# Patient Record
Sex: Female | Born: 1965 | Race: White | Hispanic: No | Smoking: Current every day smoker
Health system: Southern US, Community
[De-identification: ages and names within clinical notes are randomized; demographics above are authoritative.]

## PROBLEM LIST (undated history)

## (undated) DIAGNOSIS — Z87442 Personal history of urinary calculi: Secondary | ICD-10-CM

## (undated) DIAGNOSIS — M26609 Unspecified temporomandibular joint disorder, unspecified side: Secondary | ICD-10-CM

## (undated) DIAGNOSIS — I739 Peripheral vascular disease, unspecified: Secondary | ICD-10-CM

## (undated) DIAGNOSIS — F419 Anxiety disorder, unspecified: Secondary | ICD-10-CM

## (undated) DIAGNOSIS — I10 Essential (primary) hypertension: Secondary | ICD-10-CM

## (undated) DIAGNOSIS — J45909 Unspecified asthma, uncomplicated: Secondary | ICD-10-CM

## (undated) DIAGNOSIS — R519 Headache, unspecified: Secondary | ICD-10-CM

## (undated) DIAGNOSIS — R51 Headache: Secondary | ICD-10-CM

## (undated) HISTORY — DX: Anxiety disorder, unspecified: F41.9

## (undated) HISTORY — DX: Unspecified temporomandibular joint disorder, unspecified side: M26.609

## (undated) HISTORY — PX: BIOPSY THYROID: PRO38

## (undated) HISTORY — PX: MENISCUS REPAIR: SHX5179

---
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
## 2005-02-06 ENCOUNTER — Emergency Department (HOSPITAL_COMMUNITY): Admission: EM | Admit: 2005-02-06 | Discharge: 2005-02-06 | Payer: Self-pay | Admitting: Emergency Medicine

## 2005-03-16 ENCOUNTER — Emergency Department (HOSPITAL_COMMUNITY): Admission: EM | Admit: 2005-03-16 | Discharge: 2005-03-16 | Payer: Self-pay | Admitting: Emergency Medicine

## 2009-05-19 ENCOUNTER — Emergency Department (HOSPITAL_COMMUNITY): Admission: EM | Admit: 2009-05-19 | Discharge: 2009-05-19 | Payer: Self-pay | Admitting: Emergency Medicine

## 2009-08-22 ENCOUNTER — Emergency Department (HOSPITAL_COMMUNITY): Admission: EM | Admit: 2009-08-22 | Discharge: 2009-08-22 | Payer: Self-pay | Admitting: Emergency Medicine

## 2010-10-09 ENCOUNTER — Emergency Department (HOSPITAL_COMMUNITY)
Admission: EM | Admit: 2010-10-09 | Discharge: 2010-10-10 | Payer: Self-pay | Source: Home / Self Care | Admitting: Emergency Medicine

## 2011-06-03 ENCOUNTER — Emergency Department (HOSPITAL_COMMUNITY)
Admission: EM | Admit: 2011-06-03 | Discharge: 2011-06-04 | Disposition: A | Discharge status: Home / Self Care | Payer: Medicaid Other | Attending: Emergency Medicine | Admitting: Emergency Medicine

## 2011-06-03 ENCOUNTER — Emergency Department (HOSPITAL_COMMUNITY): Payer: Medicaid Other

## 2011-06-03 DIAGNOSIS — M25519 Pain in unspecified shoulder: Secondary | ICD-10-CM | POA: Insufficient documentation

## 2011-06-03 DIAGNOSIS — R0789 Other chest pain: Secondary | ICD-10-CM | POA: Insufficient documentation

## 2011-06-03 LAB — COMPREHENSIVE METABOLIC PANEL
Albumin: 3.5 g/dL (ref 3.5–5.2)
Alkaline Phosphatase: 82 U/L (ref 39–117)
BUN: 10 mg/dL (ref 6–23)
Creatinine, Ser: 0.82 mg/dL (ref 0.50–1.10)
GFR calc Af Amer: >60 mL/min (ref >60)
Glucose, Bld: 100 mg/dL — ABNORMAL HIGH (ref 70–99)
Potassium: 4.3 mEq/L (ref 3.5–5.1)
Total Bilirubin: 0.1 mg/dL — ABNORMAL LOW (ref 0.3–1.2)
Total Protein: 6.7 g/dL (ref 6.0–8.3)

## 2011-06-03 LAB — DIFFERENTIAL
Basophils Relative: 0 % (ref 0–1)
Eosinophils Absolute: 0.3 10*3/uL (ref 0.0–0.7)
Eosinophils Relative: 2 % (ref 0–5)
Lymphs Abs: 5.1 10*3/uL — ABNORMAL HIGH (ref 0.7–4.0)
Monocytes Absolute: 1.2 10*3/uL — ABNORMAL HIGH (ref 0.1–1.0)
Monocytes Relative: 8 % (ref 3–12)

## 2011-06-03 LAB — POCT I-STAT TROPONIN I: Troponin i, poc: 0 ng/mL (ref 0.00–0.08)

## 2011-06-03 LAB — CBC
MCH: 31.8 pg (ref 26.0–34.0)
MCHC: 33.9 g/dL (ref 30.0–36.0)
MCV: 93.6 fL (ref 78.0–100.0)
Platelets: 360 10*3/uL (ref 150–400)
RBC: 4.25 MIL/uL (ref 3.87–5.11)

## 2011-06-03 LAB — CK TOTAL AND CKMB (NOT AT ARMC)
CK, MB: 2.1 ng/mL (ref 0.3–4.0)
Total CK: 35 U/L (ref 7–177)

## 2011-06-04 LAB — POCT I-STAT TROPONIN I: Troponin i, poc: 0 ng/mL (ref 0.00–0.08)

## 2011-06-24 ENCOUNTER — Encounter: Payer: Self-pay | Admitting: Physician Assistant

## 2011-06-25 ENCOUNTER — Encounter: Payer: Self-pay | Admitting: Physician Assistant

## 2012-06-13 ENCOUNTER — Encounter (HOSPITAL_COMMUNITY): Payer: Self-pay | Admitting: Emergency Medicine

## 2012-06-13 ENCOUNTER — Emergency Department (HOSPITAL_COMMUNITY)
Admission: EM | Admit: 2012-06-13 | Discharge: 2012-06-13 | Disposition: A | Discharge status: Home / Self Care | Payer: Self-pay | Attending: Emergency Medicine | Admitting: Emergency Medicine

## 2012-06-13 DIAGNOSIS — T394X5A Adverse effect of antirheumatics, not elsewhere classified, initial encounter: Secondary | ICD-10-CM | POA: Insufficient documentation

## 2012-06-13 DIAGNOSIS — T7840XA Allergy, unspecified, initial encounter: Secondary | ICD-10-CM | POA: Insufficient documentation

## 2012-06-13 DIAGNOSIS — F988 Other specified behavioral and emotional disorders with onset usually occurring in childhood and adolescence: Secondary | ICD-10-CM | POA: Insufficient documentation

## 2012-06-13 DIAGNOSIS — R51 Headache: Secondary | ICD-10-CM | POA: Insufficient documentation

## 2012-06-13 MED ORDER — ONDANSETRON HCL 4 MG/2ML IJ SOLN
4.0000 mg | Freq: Once | INTRAMUSCULAR | Status: AC
  Administered 2012-06-13: 4 mg via INTRAVENOUS
  Filled 2012-06-13: qty 2

## 2012-06-13 MED ORDER — METHYLPREDNISOLONE SODIUM SUCC 125 MG IJ SOLR
125.0000 mg | Freq: Once | INTRAMUSCULAR | Status: AC
  Administered 2012-06-13: 125 mg via INTRAVENOUS
  Filled 2012-06-13: qty 2

## 2012-06-13 MED ORDER — DIPHENHYDRAMINE HCL 25 MG PO CAPS: 25.0000 mg | ORAL_CAPSULE | Freq: Four times a day (QID) | ORAL | Status: DC | PRN

## 2012-06-13 MED ORDER — PREDNISONE 10 MG PO TABS: 40.0000 mg | ORAL_TABLET | Freq: Every day | ORAL | Status: DC

## 2012-06-13 MED ORDER — SODIUM CHLORIDE 0.9 % IV BOLUS (SEPSIS)
1000.0000 mL | Freq: Once | INTRAVENOUS | Status: AC
  Administered 2012-06-13: 1000 mL via INTRAVENOUS

## 2012-06-13 MED ORDER — TRAMADOL HCL 50 MG PO TABS
50.0000 mg | ORAL_TABLET | Freq: Once | ORAL | Status: AC
  Administered 2012-06-13: 50 mg via ORAL
  Filled 2012-06-13: qty 1

## 2012-06-13 MED ORDER — DIPHENHYDRAMINE HCL 50 MG/ML IJ SOLN
12.5000 mg | Freq: Once | INTRAMUSCULAR | Status: AC
  Administered 2012-06-13: 12.5 mg via INTRAVENOUS
  Filled 2012-06-13: qty 1

## 2012-06-13 MED ORDER — TRAMADOL HCL 50 MG PO TABS: 50.0000 mg | ORAL_TABLET | Freq: Four times a day (QID) | ORAL | Status: DC | PRN

## 2012-06-13 NOTE — ED Notes (Cosign Needed)
8:00 PM Pt seen and examined by me in CDU. Pt with rash, subjective swelling and itching of hands, and horse voice, onset shortly after taking a new prescription of celebrex. Pt with possible allergic reaction, here for monitoring. SOlumedrol, benadryl, given IV. Pt feeling better. Will watch another hour, d/c if continues to improve.   Exam: Pt in NAD. AAOx3. PERRLA. Neck is supple. No swelling of lips, tongue, uvula. Regular HR and rhythm. Lungs are clear to auscultation bilaterally.Neurovascularly intact. No ras noted. No swelling of the hands noted.  9:42 PM Pt with no new symptoms. Tried ultram for her hip pain instead of celebrex. No reaction after hour of monitoring. Pt will be dc home Filed Vitals:   06/13/12 1812  BP: 135/84  Pulse: 98  Temp:   Resp: [address] A Roland Lipke, PA 06/13/12 2142

## 2012-06-13 NOTE — ED Notes (Signed)
Placed on all monitors upon arrival to CDU also cold drink giver per request.

## 2012-06-13 NOTE — ED Notes (Signed)
Report to Trish Fountain RN

## 2012-06-13 NOTE — ED Provider Notes (Signed)
History  This chart was scribed for Frances Numbers, MD by Shari Heritage. The patient was seen in room TR07C/TR07C. Patient's care was started at 1735.     CSN: [id number]  Arrival date & time 06/13/12  1712   First MD Initiated Contact with Patient 06/13/12 1735      Chief Complaint  Patient presents with  . Allergic Reaction  . Rash     The history is provided by the patient. No language interpreter was used.    Frances Graham is a 46 y.o. female who presents to the Emergency Department complaining of a severely itchy, diffuse body rash; nausea; and moderate to severe, constant HA possibly resulting from allergic reaction to medication onset 2 hours ago. Patient states that she took Celebrex at 1:30pm and then started to notice a rash at 3:30pm. Patient states that the rash worsened over time. Patient is also complaining of voice hoarseness. Patient denies abdominal pain. She says that she took 10 mg Zyrtec and took a shower with no relief. Patient has had an anaphylactic reaction to penicillin before. Patient is a current every day smoker.   Past Medical History  Diagnosis Date  . Chest pain   . Attention deficit disorder   . Temporomandibular joint disorder   . Anxiety     History reviewed. No pertinent past surgical history.  History reviewed. No pertinent family history.  History  Substance Use Topics  . Smoking status: Current Every Day Smoker  . Smokeless tobacco: Not on file  . Alcohol Use: No    OB History    Grav Para Term Preterm Abortions TAB SAB Ect Mult Living                  Review of Systems  Constitutional: Negative.   HENT: Positive for voice change.   Eyes: Negative.   Respiratory: Negative.   Cardiovascular: Negative.   Gastrointestinal: Positive for nausea.  Genitourinary: Negative.   Musculoskeletal: Negative.   Skin: Positive for rash.  Neurological: Positive for headaches.    Allergies  Penicillins  Home Medications   Current  Outpatient Rx  Name Route Sig Dispense Refill  . CLONAZEPAM 0.5 MG PO TABS Oral Take 0.5 mg by mouth 2 (two) times daily as needed.      Marland Kitchen LISDEXAMFETAMINE DIMESYLATE 30 MG PO CAPS Oral Take 30 mg by mouth every morning.        BP 140/96  Pulse 121  Temp 98.2 F (36.8 C) (Oral)  Resp 20  SpO2 96%  Physical Exam  Nursing note and vitals reviewed. GEN: Well-developed, well-nourished female in no acute distress HEENT: Atraumatic, normocephalic. Oropharynx clear without erythema. No posterior oropharyngeal swelling. EYES: PERRLA BL, no scleral icterus. NECK: Trachea midline, no meningismus CV: tachycardic with regular rhythm. No murmurs, rubs, or gallops PULM: No respiratory distress.  No crackles, wheezes, or rales. GI: soft, non-tender. No guarding, rebound, or tenderness. + bowel sounds  GU: deferred Neuro: cranial nerves grossly 2-12 intact, no abnormalities of strength or sensation, A and O x 3 MSK: Patient moves all 4 extremities symmetrically, no deformity, edema, or injury noted Skin:Diffuse mildly, erythematous rash that is blanching. Psych: no abnormality of mood  ED Course  Procedures (including critical care time) DIAGNOSTIC STUDIES: Oxygen Saturation is 96% on room air, adequate by my interpretation.    COORDINATION OF CARE: 5:35pm- Patient informed of current plan for treatment and evaluation and agrees with plan at this time.     Labs Reviewed -  No data to display No results found.   1. Allergic reaction       MDM  Patient evaluated by myself.  Based on findings I did not feel patient was having anaphylaxis but her symptoms were consistent with an allergic reaction.  She was treated with benadryl, solumedrol, zofran for nausea, and IVF.  Patient had improvement in vital signs and no signs that made me feel this was anaphylaxis.  Patient transferred to CDU for monitoring for up to 6 hours to make sure there was not a biphasic reaction at 6 hours following  initial exposure.  If patient remains stable she will be able to be discharged home.      I personally performed the services described in this documentation, which was scribed in my presence. The recorded information has been reviewed and considered.      Frances Numbers, MD 06/13/12 2015

## 2012-06-13 NOTE — ED Notes (Addendum)
Cold drink given,Warm blankets applied

## 2012-06-13 NOTE — ED Notes (Addendum)
Pt here with generalized body rash and itching after taking celebrex today; pt sts took 10mg  zyrtec; no SOB noted

## 2012-06-13 NOTE — ED Notes (Signed)
IV infusing in right hand without problems.

## 2013-08-08 ENCOUNTER — Emergency Department (HOSPITAL_COMMUNITY): Payer: Medicaid Other

## 2013-08-08 ENCOUNTER — Emergency Department (HOSPITAL_COMMUNITY)
Admission: EM | Admit: 2013-08-08 | Discharge: 2013-08-08 | Disposition: A | Discharge status: Home / Self Care | Payer: Medicaid Other | Attending: Emergency Medicine | Admitting: Emergency Medicine

## 2013-08-08 ENCOUNTER — Encounter (HOSPITAL_COMMUNITY): Payer: Self-pay | Admitting: Emergency Medicine

## 2013-08-08 DIAGNOSIS — F17200 Nicotine dependence, unspecified, uncomplicated: Secondary | ICD-10-CM | POA: Insufficient documentation

## 2013-08-08 DIAGNOSIS — Z880 Allergy status to penicillin: Secondary | ICD-10-CM | POA: Insufficient documentation

## 2013-08-08 DIAGNOSIS — R1032 Left lower quadrant pain: Secondary | ICD-10-CM | POA: Insufficient documentation

## 2013-08-08 DIAGNOSIS — R110 Nausea: Secondary | ICD-10-CM | POA: Insufficient documentation

## 2013-08-08 DIAGNOSIS — R109 Unspecified abdominal pain: Secondary | ICD-10-CM

## 2013-08-08 DIAGNOSIS — Z3202 Encounter for pregnancy test, result negative: Secondary | ICD-10-CM | POA: Insufficient documentation

## 2013-08-08 DIAGNOSIS — Z8659 Personal history of other mental and behavioral disorders: Secondary | ICD-10-CM | POA: Insufficient documentation

## 2013-08-08 DIAGNOSIS — N898 Other specified noninflammatory disorders of vagina: Secondary | ICD-10-CM | POA: Insufficient documentation

## 2013-08-08 DIAGNOSIS — Z8719 Personal history of other diseases of the digestive system: Secondary | ICD-10-CM | POA: Insufficient documentation

## 2013-08-08 LAB — CBC WITH DIFFERENTIAL/PLATELET
Basophils Absolute: 0 10*3/uL (ref 0.0–0.1)
Basophils Relative: 0 % (ref 0–1)
Eosinophils Absolute: 0.1 10*3/uL (ref 0.0–0.7)
HCT: 43.5 % (ref 36.0–46.0)
Hemoglobin: 15.3 g/dL — ABNORMAL HIGH (ref 12.0–15.0)
MCH: 32.8 pg (ref 26.0–34.0)
MCHC: 35.2 g/dL (ref 30.0–36.0)
Monocytes Absolute: 1.4 10*3/uL — ABNORMAL HIGH (ref 0.1–1.0)
Monocytes Relative: 9 % (ref 3–12)
RDW: 13.4 % (ref 11.5–15.5)

## 2013-08-08 LAB — WET PREP, GENITAL
Trich, Wet Prep: NONE SEEN
WBC, Wet Prep HPF POC: NONE SEEN
Yeast Wet Prep HPF POC: NONE SEEN

## 2013-08-08 LAB — URINALYSIS, ROUTINE W REFLEX MICROSCOPIC
Ketones, ur: NEGATIVE mg/dL
Leukocytes, UA: NEGATIVE
Nitrite: NEGATIVE
Specific Gravity, Urine: 1.029 (ref 1.005–1.030)
pH: 5.5 (ref 5.0–8.0)

## 2013-08-08 LAB — LIPASE, BLOOD: Lipase: 15 U/L (ref 11–59)

## 2013-08-08 LAB — COMPREHENSIVE METABOLIC PANEL
Albumin: 3.9 g/dL (ref 3.5–5.2)
BUN: 8 mg/dL (ref 6–23)
Calcium: 9.7 mg/dL (ref 8.4–10.5)
Creatinine, Ser: 0.64 mg/dL (ref 0.50–1.10)
Total Protein: 7.6 g/dL (ref 6.0–8.3)

## 2013-08-08 MED ORDER — DEXTROSE 50 % IV SOLN: 1.0000 | Freq: Once | INTRAVENOUS | Status: DC

## 2013-08-08 MED ORDER — ONDANSETRON 4 MG PO TBDP
8.0000 mg | ORAL_TABLET | Freq: Once | ORAL | Status: AC
  Administered 2013-08-08: 8 mg via ORAL
  Filled 2013-08-08: qty 2

## 2013-08-08 MED ORDER — PROMETHAZINE HCL 25 MG PO TABS: 25.0000 mg | ORAL_TABLET | Freq: Four times a day (QID) | ORAL | Status: DC | PRN

## 2013-08-08 MED ORDER — HYDROMORPHONE HCL PF 1 MG/ML IJ SOLN
1.0000 mg | Freq: Once | INTRAMUSCULAR | Status: AC
  Administered 2013-08-08: 1 mg via INTRAVENOUS
  Filled 2013-08-08: qty 1

## 2013-08-08 MED ORDER — HYDROMORPHONE HCL PF 2 MG/ML IJ SOLN
2.0000 mg | Freq: Once | INTRAMUSCULAR | Status: AC
  Administered 2013-08-08: 2 mg via INTRAMUSCULAR
  Filled 2013-08-08: qty 1

## 2013-08-08 MED ORDER — OXYCODONE-ACETAMINOPHEN 5-325 MG PO TABS: 1.0000 | ORAL_TABLET | Freq: Four times a day (QID) | ORAL | Status: DC | PRN

## 2013-08-08 NOTE — ED Notes (Signed)
Pt reports that she developed left sided flank pain that radiates around into the front of her abd. Reports some nausea, denies any urinary symptoms.

## 2013-08-08 NOTE — ED Notes (Signed)
Pt expressed she thought her blood sugar was getting low; advised Josh, PA and ordered CBG and D50; however, pt refused CBG because she doesn't like the "finger pokey thing" and stated that she would be fine.  Reported event to [place], [place].

## 2013-08-08 NOTE — ED Provider Notes (Signed)
CSN: [id number]     Arrival date & time 08/08/13  1401 History   First MD Initiated Contact with Patient 08/08/13 1411     Chief Complaint  Patient presents with  . Flank Pain   (Consider location/radiation/quality/duration/timing/severity/associated sxs/prior Treatment) HPI Comments: Patient presents with complaint of left flank pain with radiation to left groin that began acutely at 7 PM last night. Patient states that the symptoms began mildly and has become gradually worse. Pain is waxing and waning but does not completely go away. She has had nausea but no vomiting. She denies fever, chest pain, shortness of breath, diarrhea or constipation, hematuria, dysuria. Patient has irregular menstrual periods but denies current vaginal discharge or bleeding. She has not had pain like this in the past and has never been diagnosed with a kidney stone. Patient has taken 800 mg of ibuprofen at home without relief as well as a "leftover" Lortab without relief. She is not sexually active and voices no concern over having STD. Nothing makes symptoms worse.  Patient is a 47 y.o. female presenting with flank pain. The history is provided by the patient.  Flank Pain Associated symptoms include abdominal pain and nausea. Pertinent negatives include no chest pain, coughing, fever, headaches, myalgias, rash, sore throat or vomiting.    Past Medical History  Diagnosis Date  . Chest pain   . Attention deficit disorder   . Temporomandibular joint disorder   . Anxiety    History reviewed. No pertinent past surgical history. History reviewed. No pertinent family history. History  Substance Use Topics  . Smoking status: Current Every Day Smoker    Types: Cigarettes  . Smokeless tobacco: Not on file  . Alcohol Use: No   OB History   Grav Para Term Preterm Abortions TAB SAB Ect Mult Living                 Review of Systems  Constitutional: Negative for fever.  HENT: Negative for rhinorrhea and sore  throat.   Eyes: Negative for redness.  Respiratory: Negative for cough.   Cardiovascular: Negative for chest pain.  Gastrointestinal: Positive for nausea and abdominal pain. Negative for vomiting and diarrhea.  Genitourinary: Positive for flank pain. Negative for dysuria, urgency, frequency, hematuria, vaginal bleeding, vaginal discharge and menstrual problem.  Musculoskeletal: Negative for myalgias.  Skin: Negative for rash.  Neurological: Negative for headaches.    Allergies  Penicillins; Benadryl; and Celebrex  Home Medications   Current Outpatient Rx  Name  Route  Sig  Dispense  Refill  . ibuprofen (ADVIL,MOTRIN) 200 MG tablet   Oral   Take 800 mg by mouth every 6 (six) hours as needed for moderate pain.          BP 136/80  Pulse 104  Temp(Src) 98.5 F (36.9 C) (Oral)  Resp 22  Wt 251 lb 2 oz (113.91 kg)  SpO2 98%  LMP 07/24/2013 Physical Exam  Nursing note and vitals reviewed. Constitutional: She appears well-developed and well-nourished.  HENT:  Head: Normocephalic and atraumatic.  Eyes: Conjunctivae are normal. Right eye exhibits no discharge. Left eye exhibits no discharge.  Neck: Normal range of motion. Neck supple.  Cardiovascular: Normal rate, regular rhythm and normal heart sounds.   Pulmonary/Chest: Effort normal and breath sounds normal.  Abdominal: Soft. There is tenderness (LLQ, mild-moderate) in the left lower quadrant. There is CVA tenderness (Left sided).  Genitourinary: Uterus is not tender. Cervix exhibits no motion tenderness, no discharge and no friability. Right adnexum displays  no mass, no tenderness and no fullness. Left adnexum displays no mass, no tenderness and no fullness. No erythema, tenderness or bleeding around the vagina. No signs of injury around the vagina. Vaginal discharge (scant white discharge) found.  Neurological: She is alert.  Skin: Skin is warm and dry.  Psychiatric: She has a normal mood and affect.    ED Course   Procedures (including critical care time) Labs Review Labs Reviewed  WET PREP, GENITAL - Abnormal; Notable for the following:    Clue Cells Wet Prep HPF POC FEW (*)    All other components within normal limits  CBC WITH DIFFERENTIAL - Abnormal; Notable for the following:    WBC 15.3 (*)    Hemoglobin 15.3 (*)    Neutro Abs 11.4 (*)    Monocytes Absolute 1.4 (*)    All other components within normal limits  COMPREHENSIVE METABOLIC PANEL - Abnormal; Notable for the following:    Sodium 134 (*)    Glucose, Bld 118 (*)    Total Bilirubin 0.2 (*)    All other components within normal limits  URINALYSIS, ROUTINE W REFLEX MICROSCOPIC - Abnormal; Notable for the following:    Hgb urine dipstick SMALL (*)    All other components within normal limits  URINE MICROSCOPIC-ADD ON - Abnormal; Notable for the following:    Squamous Epithelial / LPF FEW (*)    All other components within normal limits  GC/CHLAMYDIA PROBE AMP  LIPASE, BLOOD  POCT PREGNANCY, URINE   Imaging Review Ct Abdomen Pelvis Wo Contrast  08/08/2013   CLINICAL DATA:  Left-sided flank pain  EXAM: CT ABDOMEN AND PELVIS WITHOUT CONTRAST  TECHNIQUE: Multidetector CT imaging of the abdomen and pelvis was performed following the standard protocol without intravenous contrast.  COMPARISON:  None.  FINDINGS: Lung bases are clear.  Unenhanced liver, gallbladder, adrenal glands, kidneys, spleen, and pancreas are normal. No ascites, lymphadenopathy, or free air.  No radiopaque renal, ureteral, or bladder calculus.  Uterus and ovaries are normal. Bladder is normal. The appendix is normal. No bowel wall thickening or focal segmental dilatation. No acute osseous abnormality.  IMPRESSION: No acute intra-abdominal or pelvic pathology.   Electronically Signed   By: Christiana Pellant M.D.   On: 08/08/2013 16:07    EKG Interpretation   None      3:16 PM Patient seen and examined. Work-up initiated. Medications ordered.   Vital signs reviewed  and are as follows: Filed Vitals:   08/08/13 1403  BP: 136/80  Pulse: 104  Temp: 98.5 F (36.9 C)  Resp: 22   CT ordered as trace hgb noted in urine.   CT reviewed by myself. Radiologist read as WNL. Pt informed. Will perform pelvic exam.  Pelvic performed with nurse tech chaperone.   Pt d/w Dr. Anitra Lauth. Pt informed of all results. Her pain is much better after IV dilaudid and she is comfortable with d/c to home. Will give pain and nausea medication for home. She does not have PCP. Given [facility] follow-up.   Patient counseled on use of narcotic pain medications. Counseled not to combine these medications with others containing tylenol. Urged not to drink alcohol, drive, or perform any other activities that requires focus while taking these medications. The patient verbalizes understanding and agrees with the plan.  The patient was urged to return to the Emergency Department immediately with worsening of current symptoms, worsening abdominal pain, persistent vomiting, blood noted in stools, fever, or any other concerns. The  patient verbalized understanding.     MDM   1. Flank pain    Pt with L flank pain, elevated WBC count. CT scan does not have evidence of ureteral stone or other abnormality. Appendix appears normal. No large ovarian cysts. No appreciable pelvic tenderness on pelvic exam. Symptoms are controlled in emergency department. Patient discharged home with pain control, nausea control, PCP referral. Appropriate return instructions given. Patient appears improved, nontoxic and comfortable at time of discharge.     Renne Crigler, PA-C 08/08/13 1919

## 2013-08-09 LAB — GC/CHLAMYDIA PROBE AMP: GC Probe RNA: NEGATIVE

## 2013-08-09 NOTE — ED Provider Notes (Signed)
Medical screening examination/treatment/procedure(s) were performed by non-physician practitioner and as supervising physician I was immediately available for consultation/collaboration.  EKG Interpretation   None         Gwyneth Sprout, MD 08/09/13 1400

## 2013-12-26 ENCOUNTER — Encounter (HOSPITAL_COMMUNITY): Payer: Self-pay | Admitting: Emergency Medicine

## 2013-12-26 ENCOUNTER — Emergency Department (HOSPITAL_COMMUNITY)
Admission: EM | Admit: 2013-12-26 | Discharge: 2013-12-26 | Disposition: A | Discharge status: Home / Self Care | Payer: Medicaid Other | Attending: Emergency Medicine | Admitting: Emergency Medicine

## 2013-12-26 DIAGNOSIS — R519 Headache, unspecified: Secondary | ICD-10-CM

## 2013-12-26 DIAGNOSIS — Z79899 Other long term (current) drug therapy: Secondary | ICD-10-CM | POA: Insufficient documentation

## 2013-12-26 DIAGNOSIS — Z880 Allergy status to penicillin: Secondary | ICD-10-CM | POA: Insufficient documentation

## 2013-12-26 DIAGNOSIS — K0889 Other specified disorders of teeth and supporting structures: Secondary | ICD-10-CM

## 2013-12-26 DIAGNOSIS — J3489 Other specified disorders of nose and nasal sinuses: Secondary | ICD-10-CM | POA: Insufficient documentation

## 2013-12-26 DIAGNOSIS — F17200 Nicotine dependence, unspecified, uncomplicated: Secondary | ICD-10-CM | POA: Insufficient documentation

## 2013-12-26 DIAGNOSIS — H9209 Otalgia, unspecified ear: Secondary | ICD-10-CM | POA: Insufficient documentation

## 2013-12-26 DIAGNOSIS — R51 Headache: Secondary | ICD-10-CM

## 2013-12-26 DIAGNOSIS — F988 Other specified behavioral and emotional disorders with onset usually occurring in childhood and adolescence: Secondary | ICD-10-CM | POA: Insufficient documentation

## 2013-12-26 DIAGNOSIS — K089 Disorder of teeth and supporting structures, unspecified: Secondary | ICD-10-CM | POA: Insufficient documentation

## 2013-12-26 MED ORDER — CLINDAMYCIN HCL 300 MG PO CAPS: 300.0000 mg | ORAL_CAPSULE | Freq: Three times a day (TID) | ORAL | Status: DC

## 2013-12-26 MED ORDER — HYDROCODONE-ACETAMINOPHEN 5-325 MG PO TABS
2.0000 | ORAL_TABLET | Freq: Once | ORAL | Status: AC
  Administered 2013-12-26: 2 via ORAL
  Filled 2013-12-26: qty 2

## 2013-12-26 MED ORDER — HYDROCODONE-ACETAMINOPHEN 5-325 MG PO TABS: 2.0000 | ORAL_TABLET | ORAL | Status: DC | PRN

## 2013-12-26 NOTE — ED Provider Notes (Signed)
CSN: [id number]     Arrival date & time 12/26/13  0847 History   First MD Initiated Contact with Patient 12/26/13 [phone]     Chief Complaint  Patient presents with  . Nasal Congestion     (Consider location/radiation/quality/duration/timing/severity/associated sxs/prior Treatment) HPI Comments: Patient complains of facial pain, ear pain, nasal congestion since March 29. She is intermittent headaches and left ear pain since then. Last night she developed some swelling to left side of her face which has since improved. No difficulty breathing or swallowing. No chest pain or shortness of breath. No documented fevers. No abdominal pain, nausea or vomiting. She's been taking aspirin and ibuprofen at home without relief.  The history is provided by the patient.    Past Medical History  Diagnosis Date  . Chest pain   . Attention deficit disorder   . Temporomandibular joint disorder   . Anxiety    No past surgical history on file. No family history on file. History  Substance Use Topics  . Smoking status: Current Every Day Smoker    Types: Cigarettes  . Smokeless tobacco: Not on file  . Alcohol Use: No   OB History   Grav Para Term Preterm Abortions TAB SAB Ect Mult Living                 Review of Systems  Constitutional: Negative for fever, activity change and appetite change.  HENT: Positive for congestion, ear pain and rhinorrhea. Negative for sore throat and trouble swallowing.   Respiratory: Negative for cough, chest tightness and shortness of breath.   Cardiovascular: Negative for chest pain.  Gastrointestinal: Negative for nausea, vomiting and abdominal pain.  Genitourinary: Negative for dysuria and hematuria.  Musculoskeletal: Negative for arthralgias and myalgias.  Skin: Negative for rash.  Neurological: Negative for dizziness, weakness and headaches.   by A complete 10 system review of systems was obtained and all systems are negative except as noted in the HPI and PMH.       Allergies  Penicillins; Benadryl; Other; and Celebrex  Home Medications   Current Outpatient Rx  Name  Route  Sig  Dispense  Refill  . ibuprofen (ADVIL,MOTRIN) 200 MG tablet   Oral   Take 800 mg by mouth every 6 (six) hours as needed for moderate pain.         Marland Kitchen lisdexamfetamine (VYVANSE) 30 MG capsule   Oral   Take 30 mg by mouth every morning.         . clindamycin (CLEOCIN) 300 MG capsule   Oral   Take 1 capsule (300 mg total) by mouth 3 (three) times daily.   30 capsule   0   . HYDROcodone-acetaminophen (NORCO/VICODIN) 5-325 MG per tablet   Oral   Take 2 tablets by mouth every 4 (four) hours as needed.   10 tablet   0    BP 146/69  Pulse 91  Temp(Src) 98.1 F (36.7 C) (Oral)  Resp 14  SpO2 96% Physical Exam  Constitutional: She is oriented to person, place, and time. She appears well-developed and well-nourished. No distress.  No appreciable facial swelling  HENT:  Head: Normocephalic and atraumatic.  Right Ear: External ear normal.  Left Ear: External ear normal.  Mouth/Throat: Oropharynx is clear and moist. No oropharyngeal exudate.  Poor dentition throughout,  Floor of mouth soft, no trismus TTP L upper gingiva without abscess TTP L upper incisor  No sinus tenderness  Eyes: Conjunctivae and EOM are normal. Pupils are  equal, round, and reactive to light.  Neck: Normal range of motion. Neck supple.  No meningismus  Cardiovascular: Normal rate, regular rhythm and normal heart sounds.   No murmur heard. Pulmonary/Chest: Effort normal and breath sounds normal. No respiratory distress.  Abdominal: Soft. There is no tenderness. There is no rebound and no guarding.  Musculoskeletal: Normal range of motion. She exhibits no edema and no tenderness.  Neurological: She is alert and oriented to person, place, and time. No cranial nerve deficit. She exhibits normal muscle tone. Coordination normal.  Skin: Skin is warm.    ED Course  Procedures  (including critical care time) Labs Review Labs Reviewed - No data to display Imaging Review No results found.   EKG Interpretation None      MDM   Final diagnoses:  Pain, dental  Facial pain   1 week of congestion, ear pain, headache and facial swelling.  No fever, chest pain, SOB.  No difficulty swallowing. TMs appear normal. Oropharynx appears normal.   Suspect poor dentition as source of pain. TM normal.  No trismus.  No evidence of ludwig's angina.   Patient with PCN allergy.  Will give clindamycin and pain medication.  Follow up with dentist.   Glynn OctaveStephen Roe Wilner, MD 12/26/13 1044

## 2013-12-26 NOTE — ED Notes (Signed)
Pt states that she has been having nasal congestion and ear pain since 12/19/2013.  Last night started having lt sided facial swelling.  States that it got so swollen that her left eye was swollen shut.

## 2013-12-26 NOTE — Discharge Instructions (Signed)

## 2014-11-09 ENCOUNTER — Other Ambulatory Visit: Payer: Self-pay | Admitting: Family Medicine

## 2014-11-09 DIAGNOSIS — E049 Nontoxic goiter, unspecified: Secondary | ICD-10-CM

## 2014-11-09 DIAGNOSIS — E041 Nontoxic single thyroid nodule: Secondary | ICD-10-CM

## 2014-11-15 ENCOUNTER — Ambulatory Visit
Admission: RE | Admit: 2014-11-15 | Discharge: 2014-11-15 | Disposition: A | Discharge status: Home / Self Care | Payer: Medicaid Other | Source: Ambulatory Visit | Attending: Family Medicine | Admitting: Family Medicine

## 2014-11-15 DIAGNOSIS — E049 Nontoxic goiter, unspecified: Secondary | ICD-10-CM

## 2014-11-15 DIAGNOSIS — E041 Nontoxic single thyroid nodule: Secondary | ICD-10-CM

## 2014-11-16 ENCOUNTER — Other Ambulatory Visit: Payer: Self-pay | Admitting: Family Medicine

## 2014-11-16 DIAGNOSIS — E079 Disorder of thyroid, unspecified: Secondary | ICD-10-CM

## 2014-11-16 DIAGNOSIS — E041 Nontoxic single thyroid nodule: Secondary | ICD-10-CM

## 2014-11-30 ENCOUNTER — Ambulatory Visit
Admission: RE | Admit: 2014-11-30 | Discharge: 2014-11-30 | Disposition: A | Discharge status: Home / Self Care | Payer: Medicaid Other | Source: Ambulatory Visit | Attending: Family Medicine | Admitting: Family Medicine

## 2014-11-30 ENCOUNTER — Other Ambulatory Visit (HOSPITAL_COMMUNITY)
Admission: RE | Admit: 2014-11-30 | Discharge: 2014-11-30 | Disposition: A | Discharge status: Home / Self Care | Payer: Medicaid Other | Source: Ambulatory Visit | Attending: Interventional Radiology | Admitting: Interventional Radiology

## 2014-11-30 DIAGNOSIS — E041 Nontoxic single thyroid nodule: Secondary | ICD-10-CM | POA: Insufficient documentation

## 2014-11-30 DIAGNOSIS — E079 Disorder of thyroid, unspecified: Secondary | ICD-10-CM

## 2016-03-20 ENCOUNTER — Emergency Department (HOSPITAL_COMMUNITY)
Admission: EM | Admit: 2016-03-20 | Discharge: 2016-03-20 | Discharge status: Against Medical Advice | Payer: Medicaid Other | Attending: Emergency Medicine | Admitting: Emergency Medicine

## 2016-03-20 ENCOUNTER — Encounter (HOSPITAL_COMMUNITY): Payer: Self-pay | Admitting: Emergency Medicine

## 2016-03-20 DIAGNOSIS — W500XXA Accidental hit or strike by another person, initial encounter: Secondary | ICD-10-CM | POA: Insufficient documentation

## 2016-03-20 DIAGNOSIS — F909 Attention-deficit hyperactivity disorder, unspecified type: Secondary | ICD-10-CM | POA: Insufficient documentation

## 2016-03-20 DIAGNOSIS — Y939 Activity, unspecified: Secondary | ICD-10-CM | POA: Insufficient documentation

## 2016-03-20 DIAGNOSIS — M79675 Pain in left toe(s): Secondary | ICD-10-CM

## 2016-03-20 DIAGNOSIS — S99922A Unspecified injury of left foot, initial encounter: Secondary | ICD-10-CM | POA: Diagnosis present

## 2016-03-20 DIAGNOSIS — F17210 Nicotine dependence, cigarettes, uncomplicated: Secondary | ICD-10-CM | POA: Insufficient documentation

## 2016-03-20 DIAGNOSIS — Y999 Unspecified external cause status: Secondary | ICD-10-CM | POA: Insufficient documentation

## 2016-03-20 DIAGNOSIS — Y929 Unspecified place or not applicable: Secondary | ICD-10-CM | POA: Diagnosis not present

## 2016-03-20 MED ORDER — NAPROXEN 500 MG PO TABS: 500.0000 mg | ORAL_TABLET | Freq: Two times a day (BID) | ORAL | Status: DC

## 2016-03-20 MED ORDER — HYDROCODONE-ACETAMINOPHEN 5-325 MG PO TABS: 1.0000 | ORAL_TABLET | ORAL | Status: DC | PRN

## 2016-03-20 MED ORDER — SULFAMETHOXAZOLE-TRIMETHOPRIM 800-160 MG PO TABS: 1.0000 | ORAL_TABLET | Freq: Two times a day (BID) | ORAL | Status: DC

## 2016-03-20 MED ORDER — IBUPROFEN 800 MG PO TABS
800.0000 mg | ORAL_TABLET | Freq: Once | ORAL | Status: AC
Start: 2016-03-20 — End: 2016-03-20
  Administered 2016-03-20: 800 mg via ORAL
  Filled 2016-03-20: qty 1

## 2016-03-20 NOTE — ED Provider Notes (Signed)
CSN: [id number]     Arrival date & time 03/20/16  1329 History  By signing my name below, I, Frances Heirrianna Graham, attest that this documentation has been prepared under the direction and in the presence of General MillsBenjamin Paula Busenbark, PA-C.  Electronically Signed: Octavia HeirArianna Graham, ED Scribe. 03/20/2016. 2:49 PM.    Chief Complaint  Patient presents with  . Toe Injury     The history is provided by the patient. No language interpreter was used.   HPI Comments: Frances Haggardngela Glock is a 50 y.o. female who presents to the Emergency Department complaining of constant, gradual worsening, burning, left fifth toe pain onset ~3 weeks ago. Pt has associated ecchymosis and swelling to the area. Pt reports breaking her left fifth toe last month. Pt notes her daughter also stepped on her toe and an insect bite on her fifth toe which caused her to have cellulitis. She was seen by UC and given antibiotics but her toe has not gotten any better. Pt notes that the ecchymosis alleviates at night when she elevates her foot but states she is awoken every night due to the pain. She denies any other complaints.   Past Medical History  Diagnosis Date  . Chest pain   . Attention deficit disorder   . Temporomandibular joint disorder   . Anxiety    History reviewed. No pertinent past surgical history. History reviewed. No pertinent family history. Social History  Substance Use Topics  . Smoking status: Current Every Day Smoker    Types: Cigarettes  . Smokeless tobacco: None  . Alcohol Use: No   OB History    No data available     Review of Systems  Musculoskeletal: Positive for arthralgias.  Skin: Positive for color change.  All other systems reviewed and are negative.     Allergies  Penicillins; Benadryl; Other; and Celebrex  Home Medications   Prior to Admission medications   Medication Sig Start Date End Date Taking? Authorizing Provider  clindamycin (CLEOCIN) 300 MG capsule Take 1 capsule (300 mg total) by  mouth 3 (three) times daily. 12/26/13   Glynn OctaveStephen Rancour, MD  HYDROcodone-acetaminophen (NORCO/VICODIN) 5-325 MG per tablet Take 2 tablets by mouth every 4 (four) hours as needed. 12/26/13   Glynn OctaveStephen Rancour, MD  ibuprofen (ADVIL,MOTRIN) 200 MG tablet Take 800 mg by mouth every 6 (six) hours as needed for moderate pain.    Historical Provider, MD  lisdexamfetamine (VYVANSE) 30 MG capsule Take 30 mg by mouth every morning.    Historical Provider, MD   BP 190/100 mmHg  Pulse 113  Temp(Src) 98.1 F (36.7 C) (Oral)  Resp 16  SpO2 97% Physical Exam  Constitutional: She is oriented to person, place, and time. She appears well-developed and well-nourished.  HENT:  Head: Normocephalic.  Eyes: EOM are normal.  Neck: Normal range of motion.  Cardiovascular: Normal rate, regular rhythm and normal heart sounds.   Pulmonary/Chest: Effort normal.  Abdominal: She exhibits no distension.  Musculoskeletal: Normal range of motion.  Intact distal pulses, dorsalis pedis 2+ bilaterally. Dusky appearance to left pinky toe that blanches and has brisk cap refill  Neurological: She is alert and oriented to person, place, and time.  Psychiatric: She has a normal mood and affect.  Nursing note and vitals reviewed.   ED Course  Procedures  DIAGNOSTIC STUDIES: Oxygen Saturation is 97% on RA, normal by my interpretation.  COORDINATION OF CARE:  2:43 PM Discussed treatment plan with pt at bedside and pt agreed to plan.  Labs  Review Labs Reviewed - No data to display  Imaging Review No results found. I have personally reviewed and evaluated these images and lab results as part of my medical decision-making.   EKG Interpretation None      MDM   Patient with left pinky toe pain for the past one week. She reports toe is purple and dusky when she walks on it, but when she lies flat discoloration resolves. No numbness or weakness, distal pulses intact. Foot otherwise without any abnormalities. Low suspicion  for emergent vascular compromise. However, discussed with my attending, Dr. Rhunette CroftNanavati, who also attempted to evaluate patient. Patient leaves prior to reevaluation AMA. Final diagnoses:  Toe pain, left   I personally performed the services described in this documentation, which was scribed in my presence. The recorded information has been reviewed and is accurate.   Joycie PeekBenjamin Yassine Brunsman, PA-C 03/20/16 1630  Derwood KaplanAnkit Nanavati, MD 03/20/16 1650

## 2016-03-20 NOTE — ED Notes (Signed)
Pt made aware of delay, that the attending physician was consulted to see the patient. Pt understands.

## 2016-03-20 NOTE — ED Notes (Signed)
Pt at nurses station request to leave.

## 2016-03-20 NOTE — ED Notes (Signed)
Elevated foot on chair. Declined ice pack

## 2016-06-03 ENCOUNTER — Other Ambulatory Visit: Payer: Self-pay | Admitting: Family Medicine

## 2016-06-03 DIAGNOSIS — R0989 Other specified symptoms and signs involving the circulatory and respiratory systems: Secondary | ICD-10-CM

## 2016-06-10 ENCOUNTER — Ambulatory Visit (INDEPENDENT_AMBULATORY_CARE_PROVIDER_SITE_OTHER): Payer: Medicaid Other | Admitting: Podiatry

## 2016-06-10 ENCOUNTER — Ambulatory Visit (INDEPENDENT_AMBULATORY_CARE_PROVIDER_SITE_OTHER): Payer: Medicaid Other

## 2016-06-10 ENCOUNTER — Encounter: Payer: Self-pay | Admitting: Podiatry

## 2016-06-10 VITALS — BP 145/98 | Temp 98.2°F | Resp 16 | Ht 67.0 in | Wt 200.0 lb

## 2016-06-10 DIAGNOSIS — I999 Unspecified disorder of circulatory system: Secondary | ICD-10-CM

## 2016-06-10 DIAGNOSIS — M79671 Pain in right foot: Secondary | ICD-10-CM

## 2016-06-10 DIAGNOSIS — M7750 Other enthesopathy of unspecified foot: Secondary | ICD-10-CM

## 2016-06-10 DIAGNOSIS — M779 Enthesopathy, unspecified: Secondary | ICD-10-CM | POA: Diagnosis not present

## 2016-06-10 DIAGNOSIS — M79672 Pain in left foot: Secondary | ICD-10-CM

## 2016-06-10 MED ORDER — CEPHALEXIN 500 MG PO CAPS: 500.0000 mg | ORAL_CAPSULE | Freq: Three times a day (TID) | ORAL | 1 refills | Status: DC

## 2016-06-10 MED ORDER — OXYCODONE-ACETAMINOPHEN 10-325 MG PO TABS: 1.0000 | ORAL_TABLET | Freq: Three times a day (TID) | ORAL | 0 refills | Status: DC | PRN

## 2016-06-10 NOTE — Progress Notes (Signed)
   Subjective:    Patient ID: Frances Graham, female    DOB: 1966-01-11, 50 y.o.   MRN: [id number]  HPI  Chief Complaint  Patient presents with  . Nail Problem    great toe nails redness, swelling and pain ... 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 Toe Pain    states R great hurts all the time and "has no circulation" ... also c/o left plantar pain.  Pt states she cannot stand to wear anything on her feet.         Review of Systems  All other systems reviewed and are negative.      Objective:   Physical Exam        Assessment & Plan:

## 2016-06-10 NOTE — Progress Notes (Signed)
   Subjective:    Patient ID: Frances Graham, female    DOB: [birth year], 50 y.o.   MRN: [id number]  HPI    Review of Systems  All other systems reviewed and are negative.      Objective:   Physical Exam        Assessment & Plan:

## 2016-06-11 ENCOUNTER — Other Ambulatory Visit: Payer: Medicaid Other

## 2016-06-12 ENCOUNTER — Encounter (HOSPITAL_COMMUNITY): Payer: Self-pay | Admitting: *Deleted

## 2016-06-12 ENCOUNTER — Emergency Department (HOSPITAL_COMMUNITY)
Admission: EM | Admit: 2016-06-12 | Discharge: 2016-06-12 | Disposition: A | Discharge status: Home / Self Care | Payer: Medicaid Other | Attending: Emergency Medicine | Admitting: Emergency Medicine

## 2016-06-12 DIAGNOSIS — Z79899 Other long term (current) drug therapy: Secondary | ICD-10-CM | POA: Insufficient documentation

## 2016-06-12 DIAGNOSIS — J029 Acute pharyngitis, unspecified: Secondary | ICD-10-CM | POA: Insufficient documentation

## 2016-06-12 DIAGNOSIS — F17210 Nicotine dependence, cigarettes, uncomplicated: Secondary | ICD-10-CM | POA: Diagnosis not present

## 2016-06-12 DIAGNOSIS — T887XXA Unspecified adverse effect of drug or medicament, initial encounter: Secondary | ICD-10-CM | POA: Insufficient documentation

## 2016-06-12 DIAGNOSIS — Z791 Long term (current) use of non-steroidal anti-inflammatories (NSAID): Secondary | ICD-10-CM | POA: Diagnosis not present

## 2016-06-12 DIAGNOSIS — T361X5A Adverse effect of cephalosporins and other beta-lactam antibiotics, initial encounter: Secondary | ICD-10-CM | POA: Diagnosis present

## 2016-06-12 DIAGNOSIS — Z79891 Long term (current) use of opiate analgesic: Secondary | ICD-10-CM | POA: Diagnosis not present

## 2016-06-12 DIAGNOSIS — Y829 Unspecified medical devices associated with adverse incidents: Secondary | ICD-10-CM | POA: Diagnosis not present

## 2016-06-12 DIAGNOSIS — T7840XA Allergy, unspecified, initial encounter: Secondary | ICD-10-CM

## 2016-06-12 MED ORDER — CLINDAMYCIN HCL 150 MG PO CAPS: 300.0000 mg | ORAL_CAPSULE | Freq: Three times a day (TID) | ORAL | 0 refills | Status: DC

## 2016-06-12 NOTE — Progress Notes (Signed)
Subjective:     Patient ID: Frances Graham, female   DOB: 1966/08/30, 50 y.o.   MRN: [id number]  HPI patient presents stating she was bit by a spider several months ago and has developed problems with her digits on both feet and she has developed infections around her big toes bilateral and she is due to go tomorrow for vascular studies   Review of Systems  Constitutional: Negative.   All other systems reviewed and are negative.      Objective:   Physical Exam  Constitutional: She is oriented to person, place, and time.  Musculoskeletal: Normal range of motion.  Neurological: She is alert and oriented to person, place, and time.  Skin: Skin is warm and dry.  Nursing note and vitals reviewed.  I noted vascular to be diminished both DP and PT pulses bilateral and there is duskiness to the digits left and right with patient noted to have drainage of the hallux nails bilateral localized in nature with no proximal edema erythema or drainage noted. Patient's found have no indication systemic we have cellulitic event but has developed some type of pathology secondary to most likely spider bites with possibility for microemboli or other vascular incident     Assessment:     Localized paronychia is of the digit was some type of vascular incident occurring secondary to spider bite    Plan:     H&P condition reviewed with patient. I've recommended warm water soaks and I placed her on cephalexin 500 mg 3 times a day and she is encouraged to reduce any smoking that she does and also I am encouraging her to see her vascular doctor tomorrow and explore the possibility for some type of vascular disease. She will be seen back as needed after this appointment and is strongly encouraged to call us if there should be any changes with her health  X-rays indicate no signs of calcification or ostial lysis of the

## 2016-06-12 NOTE — ED Triage Notes (Signed)
Pt reports seeing a foot doctor for possible infection x 2 days, was put on cephalexin, started vomiting after 2 doses of it.  States noticing white patches on top of her tongue and is having sore throat.

## 2016-06-12 NOTE — ED Provider Notes (Signed)
[facility]-EMERGENCY DEPT Provider Note   CSN: [id number] Arrival date & time: 06/12/16  4098     History   Chief Complaint Chief Complaint  Patient presents with  . Allergic Reaction    HPI Frances Graham is a 50 y.o. female.  Patient is a 50 year old female with history of ADHD and anxiety presents to the ED with complaint of allergic reaction. Patient states she was seen by her podiatrist earlier this week and was started on Keflex for suspected toe infection. She notes after taking 2 doses of antibiotics she began having nausea and multiple episodes of vomiting. Patient states she has not taken any more dose of antibiotics since. She reports having mild nausea this morning but states her symptoms have otherwise resolved. She also notes she began having a sore throat. Denies fever, headache, lightheadedness, dizziness, difficulty breathing, sensation of throat closing, facial/neck swelling, chest pain, abdominal pain. She notes that she is currently being followed by her podiatrist regarding her poor circulation in her toes which has been present for the past month.       Past Medical History:  Diagnosis Date  . Anxiety   . Attention deficit disorder   . Chest pain   . Temporomandibular joint disorder     There are no active problems to display for this patient.   History reviewed. No pertinent surgical history.  OB History    No data available       Home Medications    Prior to Admission medications   Medication Sig Start Date End Date Taking? Authorizing Provider  cephALEXin (KEFLEX) 500 MG capsule Take 1 capsule (500 mg total) by mouth 3 (three) times daily. 06/10/16  Yes Kirstie Peri Regal, DPM  gabapentin (NEURONTIN) 300 MG capsule Take 300 mg by mouth 3 (three) times daily.   Yes Historical Provider, MD  ibuprofen (ADVIL,MOTRIN) 200 MG tablet Take 800 mg by mouth 3 (three) times daily.    Yes Historical Provider, MD  lisdexamfetamine (VYVANSE) 30 MG capsule Take 30 mg  by mouth every morning.   Yes Historical Provider, MD  lisinopril-hydrochlorothiazide (PRINZIDE,ZESTORETIC) 20-25 MG tablet Take 1 tablet by mouth daily.   Yes Historical Provider, MD  oxyCODONE-acetaminophen (PERCOCET) 10-325 MG tablet Take 1 tablet by mouth every 8 (eight) hours as needed for pain. 06/10/16  Yes Kirstie Peri Regal, DPM  clindamycin (CLEOCIN) 150 MG capsule Take 2 capsules (300 mg total) by mouth 3 (three) times daily. May dispense as 150mg  capsules 06/12/16   Barrett Henle, PA-C    Family History No family history on file.  Social History Social History  Substance Use Topics  . Smoking status: Current Every Day Smoker    Types: Cigarettes  . Smokeless tobacco: Never Used  . Alcohol use No     Allergies   Penicillins; Benadryl [diphenhydramine hcl]; Keflex [cephalexin]; Other; and Celebrex [celecoxib]   Review of Systems Review of Systems  HENT: Positive for sore throat.   Gastrointestinal: Positive for nausea and vomiting.  All other systems reviewed and are negative.    Physical Exam Updated Vital Signs BP 95/73 (BP Location: Left Arm)   Pulse 109   Temp 98.1 F (36.7 C) (Oral)   Resp 16   Ht 5\' 7"  (1.702 m)   Wt 90.7 kg   LMP 06/11/2016   SpO2 96%   BMI 31.32 kg/m   Physical Exam  Constitutional: She is oriented to person, place, and time. She appears well-developed and well-nourished. No distress.  HENT:  Head: Normocephalic and atraumatic.  Mouth/Throat: Uvula is midline, oropharynx is clear and moist and mucous membranes are normal. No oropharyngeal exudate, posterior oropharyngeal edema, posterior oropharyngeal erythema or tonsillar abscesses. No tonsillar exudate.  No facial or neck swelling, floor of mouth soft.   Eyes: Conjunctivae and EOM are normal. Right eye exhibits no discharge. Left eye exhibits no discharge. No scleral icterus.  Neck: Normal range of motion. Neck supple.  Cardiovascular: Normal rate, regular rhythm, normal  heart sounds and intact distal pulses.   Pulmonary/Chest: Effort normal and breath sounds normal. No stridor. No respiratory distress. She has no wheezes. She has no rales. She exhibits no tenderness.  Abdominal: Soft. Bowel sounds are normal. She exhibits no distension and no mass. There is no tenderness. There is no rebound and no guarding. No hernia.  Musculoskeletal: Normal range of motion. She exhibits no edema.  Duskiness noted to left and right toes with diminished DP and PT pulses which the pt reports is chronic and unchanged. Sensation grossly intact. FROM. No swelling, erythema, warmth or drainage noted.   Neurological: She is alert and oriented to person, place, and time.  Skin: Skin is warm and dry. She is not diaphoretic.  Nursing note and vitals reviewed.    ED Treatments / Results  Labs (all labs ordered are listed, but only abnormal results are displayed) Labs Reviewed - No data to display  EKG  EKG Interpretation None       Radiology No results found.  Procedures Procedures (including critical care time)  Medications Ordered in ED Medications - No data to display   Initial Impression / Assessment and Plan / ED Course  I have reviewed the triage vital signs and the nursing notes.  Pertinent labs & imaging results that were available during my care of the patient were reviewed by me and considered in my medical decision making (see chart for details).  Clinical Course    Patient presents with allergic reaction. She states she started taking Keflex 2 days ago for toenail infection and reports having nausea and multiple episodes of vomiting after taking 2 doses of the antibiotic. Denies fever, abdominal pain, shortness of breath. VSS. Exam revealed duskiness to bilateral toes with diminished DP pulses; pt reports this has been chronic for the past month and she is currently being evaluated by podiatry regarding her poor circulation; plan to have arterial US  performed this week for further evaluation. Remaining exam unKorearemarkable. Airway patent. No abdominal tenderness. Lungs CTAB. Pt without sxs at this time. Plan to d/c pt home with new rx for Clindamycin for toe nail infection and advise to stop taking keflex. Advised pt to call her Podiatrist to schedule a follow up appointment regarding her toe nail infection and circulation insufficiency. Discussed strict return precautions with pt.  Final Clinical Impressions(s) / ED Diagnoses   Final diagnoses:  Allergic reaction, initial encounter    New Prescriptions Discharge Medication List as of 06/12/2016 11:28 AM    START taking these medications   Details  clindamycin (CLEOCIN) 150 MG capsule Take 2 capsules (300 mg total) by mouth 3 (three) times daily. May dispense as 150mg  capsules, Starting Wed 06/12/2016, Print         Satira Sarkicole Elizabeth DannebrogNadeau, [place] JerseyPA-C 06/12/16 1455    Gerhard Munchobert Lockwood, MD 06/13/16 [phone]

## 2016-06-12 NOTE — Discharge Instructions (Signed)
Take your antibiotic as prescribed. Refrain from taking Keflex due to your allergic reaction. Call your podiatrist to schedule a follow up appointment for reevaluation regarding your toe infection. Please return to the Emergency Department if symptoms worsen or new onset of fever, chest pain, difficulty breathing, abdominal pain, vomiting, numbness, tingling, weakness, drainage.

## 2016-06-14 ENCOUNTER — Telehealth: Payer: Self-pay | Admitting: *Deleted

## 2016-06-14 NOTE — Telephone Encounter (Signed)
Pt left name, DOB and phone number. Left message encouraging pt to leave an question, so that I could often call back with an answer.

## 2016-06-18 ENCOUNTER — Encounter (HOSPITAL_COMMUNITY): Payer: Self-pay | Admitting: Emergency Medicine

## 2016-06-18 ENCOUNTER — Emergency Department (HOSPITAL_COMMUNITY): Payer: Medicaid Other

## 2016-06-18 ENCOUNTER — Emergency Department (HOSPITAL_COMMUNITY)
Admission: EM | Admit: 2016-06-18 | Discharge: 2016-06-18 | Disposition: A | Discharge status: Home / Self Care | Payer: Medicaid Other | Attending: Emergency Medicine | Admitting: Emergency Medicine

## 2016-06-18 ENCOUNTER — Other Ambulatory Visit: Payer: Medicaid Other

## 2016-06-18 DIAGNOSIS — N189 Chronic kidney disease, unspecified: Secondary | ICD-10-CM | POA: Diagnosis not present

## 2016-06-18 DIAGNOSIS — Z79899 Other long term (current) drug therapy: Secondary | ICD-10-CM | POA: Diagnosis not present

## 2016-06-18 DIAGNOSIS — F17210 Nicotine dependence, cigarettes, uncomplicated: Secondary | ICD-10-CM | POA: Insufficient documentation

## 2016-06-18 DIAGNOSIS — I998 Other disorder of circulatory system: Secondary | ICD-10-CM | POA: Insufficient documentation

## 2016-06-18 DIAGNOSIS — M545 Low back pain: Secondary | ICD-10-CM | POA: Diagnosis present

## 2016-06-18 LAB — BASIC METABOLIC PANEL
Anion gap: 15 (ref 5–15)
BUN: 26 mg/dL — ABNORMAL HIGH (ref 6–20)
CO2: 17 mmol/L — ABNORMAL LOW (ref 22–32)
Calcium: 9.4 mg/dL (ref 8.9–10.3)
Chloride: 105 mmol/L (ref 101–111)
Creatinine, Ser: 1.52 mg/dL — ABNORMAL HIGH (ref 0.44–1.00)
GFR calc Af Amer: 46 mL/min — ABNORMAL LOW (ref >60)
GFR calc non Af Amer: 40 mL/min — ABNORMAL LOW (ref >60)
Glucose, Bld: 121 mg/dL — ABNORMAL HIGH (ref 65–99)
Potassium: 4.2 mmol/L (ref 3.5–5.1)
Sodium: 137 mmol/L (ref 135–145)

## 2016-06-18 LAB — CBC WITH DIFFERENTIAL/PLATELET
Basophils Absolute: 0 10*3/uL (ref 0.0–0.1)
Basophils Relative: 0 %
Eosinophils Absolute: 0.2 10*3/uL (ref 0.0–0.7)
Eosinophils Relative: 1 %
HCT: 38.8 % (ref 36.0–46.0)
Hemoglobin: 13.5 g/dL (ref 12.0–15.0)
Lymphocytes Relative: 17 %
Lymphs Abs: 3.7 10*3/uL (ref 0.7–4.0)
MCH: 32.3 pg (ref 26.0–34.0)
MCHC: 34.8 g/dL (ref 30.0–36.0)
MCV: 92.8 fL (ref 78.0–100.0)
Monocytes Absolute: 1.9 10*3/uL — ABNORMAL HIGH (ref 0.1–1.0)
Monocytes Relative: 9 %
Neutro Abs: 15.6 10*3/uL — ABNORMAL HIGH (ref 1.7–7.7)
Neutrophils Relative %: 73 %
Platelets: 344 10*3/uL (ref 150–400)
RBC: 4.18 MIL/uL (ref 3.87–5.11)
RDW: 13.7 % (ref 11.5–15.5)
WBC: 21.5 10*3/uL — ABNORMAL HIGH (ref 4.0–10.5)

## 2016-06-18 LAB — URINE MICROSCOPIC-ADD ON

## 2016-06-18 LAB — URINALYSIS, ROUTINE W REFLEX MICROSCOPIC
Bilirubin Urine: NEGATIVE
Glucose, UA: 100 mg/dL — AB
Ketones, ur: NEGATIVE mg/dL
Leukocytes, UA: NEGATIVE
Nitrite: NEGATIVE
Protein, ur: 30 mg/dL — AB
Specific Gravity, Urine: 1.022 (ref 1.005–1.030)
pH: 5.5 (ref 5.0–8.0)

## 2016-06-18 LAB — PROTIME-INR
INR: 1
Prothrombin Time: 13.2 seconds (ref 11.4–15.2)

## 2016-06-18 MED ORDER — OXYCODONE-ACETAMINOPHEN 5-325 MG PO TABS: 1.0000 | ORAL_TABLET | ORAL | 0 refills | Status: DC | PRN

## 2016-06-18 MED ORDER — OXYCODONE-ACETAMINOPHEN 5-325 MG PO TABS: 1.0000 | ORAL_TABLET | Freq: Once | ORAL | Status: DC

## 2016-06-18 MED ORDER — SODIUM CHLORIDE 0.9 % IV BOLUS (SEPSIS)
1000.0000 mL | Freq: Once | INTRAVENOUS | Status: AC
Start: 2016-06-18 — End: 2016-06-18
  Administered 2016-06-18: 1000 mL via INTRAVENOUS

## 2016-06-18 MED ORDER — MORPHINE SULFATE (PF) 4 MG/ML IV SOLN
4.0000 mg | Freq: Once | INTRAVENOUS | Status: AC
  Administered 2016-06-18: 4 mg via INTRAVENOUS
  Filled 2016-06-18: qty 1

## 2016-06-18 NOTE — ED Notes (Signed)
Bed: [facility] Expected date:  Expected time:  Means of arrival:  Comments: 

## 2016-06-18 NOTE — Consult Note (Signed)
Patient name: Frances Graham MRN: [id number] DOB: 08-30-1966 Sex: female  REASON FOR CONSULT: Ischemic toes. [facility]   HPI: Frances Haggardngela Forester is a 50 y.o. female, who had some back pain today with pain down her left leg. This brought her to the emergency Department. While there was noted that she had some ischemic toes and for this reason vascular surgery was consult.  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 She denies thigh or hip claudication. This is a more significant on the left side. She also states that she's had rest pain in her right foot for several months. In addition, she is noted some discoloration of her right great toe and also discoloration of her left first and fifth toes. The fifth toe she believes related to a fracture when someone stepped on her toe and she also had a spider bite on her left foot.  Her risk factors for peripheral vascular disease include a heavy history of tobacco use and hypertension. She denies any history of diabetes, hypercholesterolemia, or family history of premature cardiovascular disease. She had smoked up to 2 packs per day for many years but is now cut back to 5 cigarettes a day.  She denies any acute onset symptoms in her lower extremities. Her symptoms have been gradual in onset at have been present for several months.  Past Medical History:  Diagnosis Date  . Anxiety   . Attention deficit disorder   . Chest pain   . Temporomandibular joint disorder     History reviewed. No pertinent family history.  SOCIAL HISTORY: Social History   Social History  . Marital status: Widowed    Spouse name: N/A  . Number of children: N/A  . Years of education: N/A   Occupational History  . Not on file.   Social History Main Topics  . Smoking status: Current Every Day Smoker    Types: Cigarettes  . Smokeless tobacco: Never Used  . Alcohol use No  . Drug use: No  .  Sexual activity: Not on file   Other Topics Concern  . Not on file   Social History Narrative  . No narrative on file    Allergies  Allergen Reactions  . Penicillins Anaphylaxis  . Benadryl [Diphenhydramine Hcl] Other (See Comments)    syncope  . Keflex [Cephalexin] Nausea And Vomiting  . Other Other (See Comments)    "I can't take anything with "cillins" on the end of it" - Anaphylaxis  . Celebrex [Celecoxib] Nausea And Vomiting and Rash    No current facility-administered medications for this encounter.    Current Outpatient Prescriptions  Medication Sig Dispense Refill  . cephALEXin (KEFLEX) 500 MG capsule Take 1 capsule (500 mg total) by mouth 3 (three) times daily. 30 capsule 1  . clindamycin (CLEOCIN) 150 MG capsule Take 2 capsules (300 mg total) by mouth 3 (three) times daily. May dispense as 150mg  capsules 60 capsule 0  . gabapentin (NEURONTIN) 300 MG capsule Take 300 mg by mouth 3 (three) times daily.    Marland Kitchen. ibuprofen (ADVIL,MOTRIN) 200 MG tablet Take 800 mg by mouth 3 (three) times daily.     Marland Kitchen. lisdexamfetamine (VYVANSE) 30 MG capsule Take 30 mg by mouth every morning.    Marland Kitchen. lisinopril-hydrochlorothiazide (PRINZIDE,ZESTORETIC) 20-25 MG tablet Take 1 tablet by mouth daily.    Marland Kitchen. oxyCODONE-acetaminophen (PERCOCET) 10-325 MG tablet Take 1 tablet by mouth every 8 (eight) hours  as needed for pain. 30 tablet 0    REVIEW OF SYSTEMS:  [X]  denotes positive finding, [ ]  denotes negative finding Cardiac  Comments:  Chest pain or chest pressure:    Shortness of breath upon exertion: X   Short of breath when lying flat:    Irregular heart rhythm:        Vascular    Pain in calf, thigh, or hip brought on by ambulation: X Bilateral calves   Pain in feet at night that wakes you up from your sleep:  X Right foot   Blood clot in your veins:    Leg swelling:         Pulmonary    Oxygen at home:    Productive cough:     Wheezing:         Neurologic    Sudden weakness in arms or  legs:     Sudden numbness in arms or legs:     Sudden onset of difficulty speaking or slurred speech:    Temporary loss of vision in one eye:     Problems with dizziness:         Gastrointestinal    Blood in stool:     Vomited blood:         Genitourinary    Burning when urinating:     Blood in urine:        Psychiatric    Major depression:         Hematologic    Bleeding problems:    Problems with blood clotting too easily:        Skin    Rashes or ulcers:        Constitutional    Fever or chills:      PHYSICAL EXAM: Vitals:   06/18/16 1838  BP: (!) 103/54  Pulse: 108  Resp: 18  Temp: 98.6 F (37 C)  TempSrc: Oral  SpO2: 94%    GENERAL: The patient is a well-nourished female, in no acute distress. The vital signs are documented above. CARDIAC: There is a regular rate and rhythm.  VASCULAR: I do not detect carotid bruits. I cannot palpate femoral, popliteal, or pedal pulses bilaterally. I can get posterior tibial signals in both feet which are monophasic. I cannot get dorsalis pedis signals. She has some bluish discoloration of her right great toe and also some discoloration of her left fifth and left first toe and to a lesser degree her second third and fourth toes. She has no significant lower extremity swelling. PULMONARY: There is good air exchange bilaterally without wheezing or rales. ABDOMEN: Soft and non-tender with normal pitched bowel sounds.  MUSCULOSKELETAL: There are no major deformities or cyanosis. NEUROLOGIC: No focal weakness or paresthesias are detected. SKIN: There are no ulcers or rashes noted. PSYCHIATRIC: The patient has a normal affect.  DATA:   Her creatinine is 1.52. GFR is 40.  MEDICAL ISSUES:  CHRONIC MULTILEVEL ARTERIAL OCCLUSIVE DISEASE: Based on her exam, I suspect that she has multilevel arterial occlusive disease. I cannot palpate femoral pulses and I suspect she has severe aortoiliac occlusive disease. This reason I would  recommend CT angiogram for her initial workup although she does have some renal insufficiency that may need to be worked up first. Her symptoms are not acute and likely been going on for several months. We have discussed the importance of tobacco cessation. I encouraged to stay as active as possible. I will arrange for renal consultation and then subsequent  CT angiogram to evaluate her multilevel arterial occlusive disease. I'll then arrange for an appointment in the office and make further recommendations based on the results of the CT.   Waverly Ferrari [facility] [phone]

## 2016-06-18 NOTE — ED Notes (Signed)
Patient was alert, oriented and stable upon discharge. RN went over AVS and patient had no further questions.  

## 2016-06-18 NOTE — ED Provider Notes (Signed)
Patient seen and evaluated. Discussed with, and evaluated with Eyvonne MechanicJeffrey Hedges, PA-C. Patient is a smoker and a history of hypertension. She reports a blue toe the right great toe starting on the first of the month or so in September. She developed some draining ulcer. States that she had a block performed to the toe per primary care physician and had" ingrown toe nail removed". She is now developing blue discoloration to her left great toe and left small toe with small peripheral/distal ulcerations as well.  No history of Atrial Fibrillation. Her pulse palpation regular. EKG is pending. She has a blue right great toe blue left distal great toe, and a blue left fifth toe with ulceration distally. I cannot palpate a DP or PT pulses. PA describes +Monophasic pulse to LLE PT, NO DP.  No US pulses to RLE DP or PT.   Dx:  Blue toe syndrome.  Recommend vascular consult.  EKG baseline labs here.       Rolland PorterMark Chelcie Estorga, MD 06/18/16 1807

## 2016-06-18 NOTE — ED Notes (Signed)
Bed: [facility]02 Expected date:  Expected time:  Means of arrival:  Comments: Vascular surgery

## 2016-06-18 NOTE — Discharge Instructions (Signed)
Please follow-up with Dr. Edilia Boickson tomorrow as previously indicated. Please inform your primary care provider of your visit today and all relevant data. Please request immediate follow-up evaluation for repeat labs and ongoing management. Please return the emergency room immediately if you experience any new or worsening signs or symptoms.

## 2016-06-18 NOTE — ED Provider Notes (Signed)
[facility]-EMERGENCY DEPT Provider Note   CSN: [id number] Arrival date & time: 06/18/16  1544     History   Chief Complaint Chief Complaint  Patient presents with  . Back Pain  . Toe Pain    HPI Frances Graham is a 50 y.o. female.  HPI   50 year old female presents today with multiple complaints. Patient reports she was going to give vascular ultrasound of her lower extremities as she has had recent discoloration of her lower extremities. She notes left great toe had blue discoloration in June of this year. She notes infections to the left foot that have been intermittent and reoccurring. She denies any infectious etiology today. She notes approximately 2 weeks ago she started to have what appeared to be an ingrown toenail on the right. She was seen by a podiatrist who performed a digital block. She notes after the digital block she had significant pain, discoloration of the right great toe. She reports the pain has been severe and persistent, with worsening discoloration. She recently followed up with podiatry who noted no peripheral pulses. They scheduled her for vascular ultrasound. Patient reports she was going to the vascular ultrasound today when she had an acute episode of lower back pain. She reports this is typical of her chronic back pain with acute exacerbation. She reports numbness and weakness in the left lower extremity that has now resolved. She reports she still has some baseline pain in the back, not a severe as previous. She denies any abdominal pain, lower extremity sensory deficits presently, full active range of motion and strength. She denies any fever or chills, abdominal pain, dizziness, loss of bladder bladder functioning him or any other red flags for back pain. She denies any dysuria, notes that she has had an upper respiratory infection over the last 3 or 4 days with productive cough. She denies any other rashes signs of cellulitis. Patient reports poor by mouth intake  over the last week as she was morning the loss of her mother, had not had anything to eat or drink today. She denies drugs or alcohol use.    Past Medical History:  Diagnosis Date  . Anxiety   . Attention deficit disorder   . Chest pain   . Temporomandibular joint disorder     There are no active problems to display for this patient.   History reviewed. No pertinent surgical history.  OB History    No data available       Home Medications    Prior to Admission medications   Medication Sig Start Date End Date Taking? Authorizing Provider  clindamycin (CLEOCIN) 150 MG capsule Take 2 capsules (300 mg total) by mouth 3 (three) times daily. May dispense as 150mg  capsules 06/12/16  Yes Barrett Henle, PA-C  gabapentin (NEURONTIN) 300 MG capsule Take 300-600 mg by mouth See admin instructions. Take 300 mg every morning and afternoon, and 600 mg every night.   Yes Historical Provider, MD  ibuprofen (ADVIL,MOTRIN) 200 MG tablet Take 800 mg by mouth 3 (three) times daily.    Yes Historical Provider, MD  lisdexamfetamine (VYVANSE) 40 MG capsule Take 40 mg by mouth daily as needed. ADD.   Yes Historical Provider, MD  lisinopril-hydrochlorothiazide (PRINZIDE,ZESTORETIC) 20-25 MG tablet Take 1 tablet by mouth daily.   Yes Historical Provider, MD  cephALEXin (KEFLEX) 500 MG capsule Take 1 capsule (500 mg total) by mouth 3 (three) times daily. Patient not taking: Reported on 06/18/2016 06/10/16   Lenn Sink, DPM  oxyCODONE-acetaminophen (PERCOCET/ROXICET) 5-325 MG tablet Take 1 tablet by mouth every 4 (four) hours as needed for severe pain. 06/18/16   Eyvonne MechanicJeffrey Ilan Kahrs, PA-C    Family History History reviewed. No pertinent family history.  Social History Social History  Substance Use Topics  . Smoking status: Current Every Day Smoker    Types: Cigarettes  . Smokeless tobacco: Never Used  . Alcohol use No     Allergies   Penicillins; Benadryl [diphenhydramine hcl]; Codeine;  Keflex [cephalexin]; Other; and Celebrex [celecoxib]   Review of Systems Review of Systems  All other systems reviewed and are negative.    Physical Exam Updated Vital Signs BP 114/64 (BP Location: Right Arm)   Pulse 98   Temp 98.6 F (37 C) (Oral)   Resp 18   LMP 06/11/2016   SpO2 91%   Physical Exam  Constitutional: She is oriented to person, place, and time. She appears well-developed and well-nourished.  HENT:  Head: Normocephalic and atraumatic.  Eyes: Conjunctivae are normal. Pupils are equal, round, and reactive to light. Right eye exhibits no discharge. Left eye exhibits no discharge. No scleral icterus.  Neck: Normal range of motion. No JVD present. No tracheal deviation present.  Cardiovascular: Normal rate.   Pulmonary/Chest: Effort normal. No stridor. No respiratory distress. She has no wheezes. She has no rales. She exhibits no tenderness.  Abdominal: Soft. She exhibits no distension. There is no tenderness.  Musculoskeletal:  Bluish discoloration to right great toe, left great toe, left fifth toe, worse on right great toe. No signs of surrounding cellulitis  Unable to palpate dorsal pedis, posterior tibial pulses. Doppler shows very weak left-sided posterior tibial   No  palpable femoral pulses  Neurological: She is alert and oriented to person, place, and time. Coordination normal.  Psychiatric: She has a normal mood and affect. Her behavior is normal. Judgment and thought content normal.  Nursing note and vitals reviewed.    ED Treatments / Results  Labs (all labs ordered are listed, but only abnormal results are displayed) Labs Reviewed  CBC WITH DIFFERENTIAL/PLATELET - Abnormal; Notable for the following:       Result Value   WBC 21.5 (*)    Neutro Abs 15.6 (*)    Monocytes Absolute 1.9 (*)    All other components within normal limits  BASIC METABOLIC PANEL - Abnormal; Notable for the following:    CO2 17 (*)    Glucose, Bld 121 (*)    BUN 26 (*)     Creatinine, Ser 1.52 (*)    GFR calc non Af Amer 40 (*)    GFR calc Af Amer 46 (*)    All other components within normal limits  URINALYSIS, ROUTINE W REFLEX MICROSCOPIC (NOT AT [facility]) - Abnormal; Notable for the following:    APPearance CLOUDY (*)    Glucose, UA 100 (*)    Hgb urine dipstick SMALL (*)    Protein, ur 30 (*)    All other components within normal limits  URINE MICROSCOPIC-ADD ON - Abnormal; Notable for the following:    Squamous Epithelial / LPF 0-5 (*)    Bacteria, UA FEW (*)    Casts WBC CAST (*)    All other components within normal limits  PROTIME-INR    EKG  EKG Interpretation None       Radiology Dg Chest 2 View  Result Date: 06/18/2016 CLINICAL DATA:  50 year old female with cough EXAM: CHEST  2 VIEW COMPARISON:  Chest radiograph dated 06/03/2011  FINDINGS: The heart size and mediastinal contours are within normal limits. Both lungs are clear. The visualized skeletal structures are unremarkable. IMPRESSION: No active cardiopulmonary disease. Electronically Signed   By: Elgie Collard M.D.   On: 06/18/2016 23:03    Procedures Procedures (including critical care time)  Medications Ordered in ED Medications  morphine 4 MG/ML injection 4 mg (4 mg Intravenous Given 06/18/16 1906)  sodium chloride 0.9 % bolus 1,000 mL (0 mLs Intravenous Stopped 06/18/16 2314)  morphine 4 MG/ML injection 4 mg (4 mg Intravenous Given 06/18/16 2256)     Initial Impression / Assessment and Plan / ED Course  I have reviewed the triage vital signs and the nursing notes.  Pertinent labs & imaging results that were available during my care of the patient were reviewed by me and considered in my medical decision making (see chart for details).  Clinical Course    Final Clinical Impressions(s) / ED Diagnoses   Final diagnoses:  Ischemia    50 year old female presents today with chief complaint of back pain. Patient has chronic back pain, with an exacerbation. She has no  red flags, this is typical of her back pain, improving while here in the ED. Incidentally, her back pain started as she was going to have ultrasound of her lower extremities for what appears to be ischemia. Patient has skin changes and discoloration of her right great toe, and left great toe. Patient has been closely followed by podiatry for this, but due to severity of symptoms. Vascular surgery was consultative. Dr. Durwin Nora personally evaluated the patient in the emergency room, he reported that she did not need any emergent evaluation, and that he would be contacting her tomorrow to schedule outpatient angiogram. Patient was noted, had elevated creatinine here in the ED, no recent creatinine to compare from baseline. Dr. Durwin Nora will be arranging renal consultation and subsequent CT angiogram. Basic labs were drawn in the event. Patient would be transferred to [facility] for emergent evaluation. Patient was noted to have elevated WBCs here. She is afebrile, nontoxic. Patient has very minimal upper respiratory complaints, with no other signs of infectious etiology. Patient does have skin changes to her lower extremity, but this does not appear to be cellulitic or have any significant infection that require antibiotics at this time. Uncertain etiology of patient's elevated WBCs at this time. Patient appears very well, no need for hospital admission at this time. Patient will have close follow-up with both Dr. Durwin Nora and her primary care provider in the next 1-2 days for reevaluation and repeat laboratory analysis. She is instructed to return immediately to the emergency room if she experiences any new or worsening signs or symptoms including infectious etiologies. Patient verbalized her understanding and agreement to today's clinic. No further questions or concerns at the time of discharge. Patient care was shared with Rolland Porter, who initially evaluated the patient. Plan and disposition was discussed with Dr. Karma Ganja who  agreed to my assessment and plan.   New Prescriptions Discharge Medication List as of 06/18/2016 10:55 PM    START taking these medications   Details  oxyCODONE-acetaminophen (PERCOCET/ROXICET) 5-325 MG tablet Take 1 tablet by mouth every 4 (four) hours as needed for severe pain., Starting Tue 06/18/2016, Print         Eyvonne Mechanic, PA-C 06/18/16 2322    Jerelyn Scott, MD 06/18/16 2329    Jerelyn Scott, MD 06/18/16 [phone]

## 2016-06-18 NOTE — ED Notes (Signed)
Vascular surgeon at bedside.

## 2016-06-18 NOTE — ED Triage Notes (Signed)
Per EMS- Patient reported that she was on her way to have right foot xray, but felt a sharp pain in her left lower back. Pain then radiated down the left leg. Patient also c/o right toes pain. EMS reported that the right toe is purple and slightly swollen.

## 2016-06-19 ENCOUNTER — Emergency Department (HOSPITAL_COMMUNITY)
Admission: EM | Admit: 2016-06-19 | Discharge: 2016-06-19 | Disposition: A | Discharge status: Home / Self Care | Payer: Medicaid Other | Attending: Emergency Medicine | Admitting: Emergency Medicine

## 2016-06-19 ENCOUNTER — Other Ambulatory Visit: Payer: Self-pay | Admitting: *Deleted

## 2016-06-19 ENCOUNTER — Encounter (HOSPITAL_COMMUNITY): Payer: Self-pay | Admitting: *Deleted

## 2016-06-19 DIAGNOSIS — M79604 Pain in right leg: Secondary | ICD-10-CM | POA: Diagnosis present

## 2016-06-19 DIAGNOSIS — F17210 Nicotine dependence, cigarettes, uncomplicated: Secondary | ICD-10-CM | POA: Diagnosis not present

## 2016-06-19 DIAGNOSIS — N289 Disorder of kidney and ureter, unspecified: Secondary | ICD-10-CM

## 2016-06-19 DIAGNOSIS — I779 Disorder of arteries and arterioles, unspecified: Secondary | ICD-10-CM

## 2016-06-19 DIAGNOSIS — Z79899 Other long term (current) drug therapy: Secondary | ICD-10-CM | POA: Diagnosis not present

## 2016-06-19 DIAGNOSIS — M5441 Lumbago with sciatica, right side: Secondary | ICD-10-CM | POA: Diagnosis not present

## 2016-06-19 DIAGNOSIS — Z01810 Encounter for preprocedural cardiovascular examination: Secondary | ICD-10-CM

## 2016-06-19 MED ORDER — METAXALONE 400 MG HALF TABLET
400.0000 mg | ORAL_TABLET | Freq: Once | ORAL | Status: AC
  Administered 2016-06-19: 400 mg via ORAL
  Filled 2016-06-19: qty 1

## 2016-06-19 MED ORDER — METAXALONE 400 MG PO TABS: 400.0000 mg | ORAL_TABLET | Freq: Three times a day (TID) | ORAL | 0 refills | Status: DC | PRN

## 2016-06-19 MED ORDER — OXYCODONE-ACETAMINOPHEN 5-325 MG PO TABS
1.0000 | ORAL_TABLET | Freq: Once | ORAL | Status: AC
  Administered 2016-06-19: 1 via ORAL
  Filled 2016-06-19: qty 1

## 2016-06-19 NOTE — ED Provider Notes (Signed)
[facility]-EMERGENCY DEPT Provider Note   CSN: [id number] Arrival date & time: 06/19/16  1444     History   Chief Complaint Chief Complaint  Patient presents with  . Leg Pain    HPI Frances Graham is a 50 y.o. female.  The history is provided by the patient.  Leg Pain   Pertinent negatives include no numbness.  Patient resents with pain in her lower back going to her right leg. States it was like lightning. States it feels like her previous sciatica but it goes across the back considered just on the leg. No loss of bladder bowel control. This is typical pain she's had in the past but much more severe. Seen yesterday in the ER for left calf pain. States his pain was on the right side. Was scheduled for CT angiography to evaluate claudication left leg. Pain was also crampy.  Past Medical History:  Diagnosis Date  . Anxiety   . Attention deficit disorder   . Chest pain   . Temporomandibular joint disorder     There are no active problems to display for this patient.   History reviewed. No pertinent surgical history.  OB History    No data available       Home Medications    Prior to Admission medications   Medication Sig Start Date End Date Taking? Authorizing Provider  clindamycin (CLEOCIN) 150 MG capsule Take 2 capsules (300 mg total) by mouth 3 (three) times daily. May dispense as 150mg  capsules 06/12/16  Yes Barrett Henle, PA-C  gabapentin (NEURONTIN) 300 MG capsule Take 300-600 mg by mouth See admin instructions. Take 300 mg every morning and afternoon, and 600 mg every night.   Yes Historical Provider, MD  ibuprofen (ADVIL,MOTRIN) 200 MG tablet Take 800 mg by mouth 3 (three) times daily.    Yes Historical Provider, MD  lisdexamfetamine (VYVANSE) 40 MG capsule Take 40 mg by mouth daily as needed. ADD.   Yes Historical Provider, MD  lisinopril-hydrochlorothiazide (PRINZIDE,ZESTORETIC) 20-25 MG tablet Take 1 tablet by mouth daily.   Yes Historical Provider, MD    oxyCODONE-acetaminophen (PERCOCET/ROXICET) 5-325 MG tablet Take 1 tablet by mouth every 4 (four) hours as needed for severe pain. 06/18/16  Yes Jeffrey Hedges, PA-C  cephALEXin (KEFLEX) 500 MG capsule Take 1 capsule (500 mg total) by mouth 3 (three) times daily. Patient not taking: Reported on 06/19/2016 06/10/16   Lenn Sink, DPM  metaxalone (SKELAXIN) 400 MG tablet Take 1 tablet (400 mg total) by mouth 3 (three) times daily as needed for muscle spasms. 06/19/16   Benjiman Core, MD    Family History No family history on file.  Social History Social History  Substance Use Topics  . Smoking status: Current Every Day Smoker    Types: Cigarettes  . Smokeless tobacco: Never Used  . Alcohol use No     Allergies   Penicillins; Benadryl [diphenhydramine hcl]; Codeine; Keflex [cephalexin]; Other; and Celebrex [celecoxib]   Review of Systems Review of Systems  Constitutional: Negative for appetite change and fever.  Respiratory: Negative for cough and choking.   Cardiovascular: Negative for leg swelling.  Gastrointestinal: Negative for abdominal pain.  Genitourinary: Negative for flank pain and hematuria.  Musculoskeletal: Positive for back pain. Negative for joint swelling and neck pain.  Skin: Positive for wound.  Neurological: Negative for seizures and numbness.  Hematological: Negative for adenopathy.  Psychiatric/Behavioral: Negative for confusion.     Physical Exam Updated Vital Signs BP 114/84   Pulse 106  Temp 98.7 F (37.1 C) (Oral)   Resp 16   LMP 06/11/2016   SpO2 100%   Physical Exam  Constitutional: She appears well-developed.  HENT:  Head: Atraumatic.  Eyes: EOM are normal.  Neck: Neck supple.  Cardiovascular: Normal rate.   Pulmonary/Chest: Effort normal.  Musculoskeletal: She exhibits tenderness.  Tenderness on right SI area. No rash. Mild pain with straight leg raise on right side. Sensation intact in right foot. Chronic wounds to bilateral great  toes. Pulses intact on right foot.  Skin: Skin is warm.  Psychiatric: She has a normal mood and affect.     ED Treatments / Results  Labs (all labs ordered are listed, but only abnormal results are displayed) Labs Reviewed - No data to display  EKG  EKG Interpretation None       Radiology Dg Chest 2 View  Result Date: 06/18/2016 CLINICAL DATA:  50 year old female with cough EXAM: CHEST  2 VIEW COMPARISON:  Chest radiograph dated 06/03/2011 FINDINGS: The heart size and mediastinal contours are within normal limits. Both lungs are clear. The visualized skeletal structures are unremarkable. IMPRESSION: No active cardiopulmonary disease. Electronically Signed   By: Elgie CollardArash  Radparvar M.D.   On: 06/18/2016 23:03    Procedures Procedures (including critical Graham time)  Medications Ordered in ED Medications  oxyCODONE-acetaminophen (PERCOCET/ROXICET) 5-325 MG per tablet 1 tablet (1 tablet Oral Given 06/19/16 1656)  metaxalone (SKELAXIN) tablet 400 mg (400 mg Oral Given 06/19/16 1656)     Initial Impression / Assessment and Plan / ED Course  I have reviewed the triage vital signs and the nursing notes.  Pertinent labs & imaging results that were available during my Graham of the patient were reviewed by me and considered in my medical decision making (see chart for details).  Clinical Course    Patient with back pain. Appears to be muscle skeletal. Labs reviewed from yesterday. Does not appear to be ischemic. Feels better after muscle relaxer. Should have narcotics at home from previous prescriptions. Will discharge.  Final Clinical Impressions(s) / ED Diagnoses   Final diagnoses:  Right-sided low back pain with right-sided sciatica    New Prescriptions New Prescriptions   METAXALONE (SKELAXIN) 400 MG TABLET    Take 1 tablet (400 mg total) by mouth 3 (three) times daily as needed for muscle spasms.     Benjiman CoreNathan Aubery Date, MD 06/19/16 [phone]

## 2016-06-19 NOTE — ED Notes (Signed)
Pt  toe soaked and wrapped per pt request.

## 2016-06-19 NOTE — ED Notes (Signed)
Pt feet are soaking.

## 2016-06-19 NOTE — ED Notes (Signed)
Pt able to pull herself up in the bed and take her PO medication without difficulty.

## 2016-06-19 NOTE — ED Triage Notes (Signed)
Per [facility]-here for same symptoms yesterday-having lower back pain-states B/L great toes are discolored-right leg pain radiating to hips

## 2016-06-19 NOTE — ED Triage Notes (Addendum)
Patient presents with bilateral leg pain and numbness.  She was seen here yesterday for same and evaluated by vascular surgeon who recommended an angiogram, but no emergent treatment.  Patient's great toes are bluish in color and she c/o numbness in both legs and feet.  Despite c/o numbness, patient is violently moving arms and legs in triage, threw herself in the floor in the waiting room and then refused to stand.  After being assisted into wheelchair she again sat down in the floor in triage room, stating sitting upright hurt her back.

## 2016-06-23 ENCOUNTER — Inpatient Hospital Stay (HOSPITAL_COMMUNITY)
Admission: EM | Admit: 2016-06-23 | Discharge: 2016-07-03 | DRG: 271 | Disposition: A | Discharge status: Home / Self Care | Payer: Medicaid Other | Attending: Vascular Surgery | Admitting: Vascular Surgery

## 2016-06-23 ENCOUNTER — Inpatient Hospital Stay (HOSPITAL_COMMUNITY): Payer: Medicaid Other

## 2016-06-23 ENCOUNTER — Encounter (HOSPITAL_COMMUNITY): Payer: Self-pay | Admitting: Emergency Medicine

## 2016-06-23 ENCOUNTER — Emergency Department (HOSPITAL_COMMUNITY): Payer: Medicaid Other

## 2016-06-23 DIAGNOSIS — Z888 Allergy status to other drugs, medicaments and biological substances status: Secondary | ICD-10-CM | POA: Diagnosis not present

## 2016-06-23 DIAGNOSIS — G8929 Other chronic pain: Secondary | ICD-10-CM | POA: Diagnosis present

## 2016-06-23 DIAGNOSIS — I739 Peripheral vascular disease, unspecified: Secondary | ICD-10-CM | POA: Diagnosis present

## 2016-06-23 DIAGNOSIS — E782 Mixed hyperlipidemia: Secondary | ICD-10-CM | POA: Diagnosis present

## 2016-06-23 DIAGNOSIS — M549 Dorsalgia, unspecified: Secondary | ICD-10-CM

## 2016-06-23 DIAGNOSIS — Z881 Allergy status to other antibiotic agents status: Secondary | ICD-10-CM | POA: Diagnosis not present

## 2016-06-23 DIAGNOSIS — E8881 Metabolic syndrome: Secondary | ICD-10-CM | POA: Diagnosis present

## 2016-06-23 DIAGNOSIS — I7789 Other specified disorders of arteries and arterioles: Secondary | ICD-10-CM | POA: Diagnosis present

## 2016-06-23 DIAGNOSIS — I96 Gangrene, not elsewhere classified: Secondary | ICD-10-CM | POA: Diagnosis present

## 2016-06-23 DIAGNOSIS — Z79899 Other long term (current) drug therapy: Secondary | ICD-10-CM

## 2016-06-23 DIAGNOSIS — L03115 Cellulitis of right lower limb: Secondary | ICD-10-CM | POA: Diagnosis present

## 2016-06-23 DIAGNOSIS — Z8249 Family history of ischemic heart disease and other diseases of the circulatory system: Secondary | ICD-10-CM | POA: Diagnosis not present

## 2016-06-23 DIAGNOSIS — F988 Other specified behavioral and emotional disorders with onset usually occurring in childhood and adolescence: Secondary | ICD-10-CM | POA: Diagnosis present

## 2016-06-23 DIAGNOSIS — Z01810 Encounter for preprocedural cardiovascular examination: Secondary | ICD-10-CM | POA: Diagnosis not present

## 2016-06-23 DIAGNOSIS — I70221 Atherosclerosis of native arteries of extremities with rest pain, right leg: Secondary | ICD-10-CM

## 2016-06-23 DIAGNOSIS — L97509 Non-pressure chronic ulcer of other part of unspecified foot with unspecified severity: Secondary | ICD-10-CM | POA: Diagnosis present

## 2016-06-23 DIAGNOSIS — Z01818 Encounter for other preprocedural examination: Secondary | ICD-10-CM

## 2016-06-23 DIAGNOSIS — Z825 Family history of asthma and other chronic lower respiratory diseases: Secondary | ICD-10-CM | POA: Diagnosis not present

## 2016-06-23 DIAGNOSIS — M545 Low back pain, unspecified: Secondary | ICD-10-CM | POA: Diagnosis present

## 2016-06-23 DIAGNOSIS — F419 Anxiety disorder, unspecified: Secondary | ICD-10-CM | POA: Diagnosis present

## 2016-06-23 DIAGNOSIS — N179 Acute kidney failure, unspecified: Secondary | ICD-10-CM | POA: Diagnosis present

## 2016-06-23 DIAGNOSIS — Z880 Allergy status to penicillin: Secondary | ICD-10-CM | POA: Diagnosis not present

## 2016-06-23 DIAGNOSIS — Z720 Tobacco use: Secondary | ICD-10-CM | POA: Diagnosis not present

## 2016-06-23 DIAGNOSIS — I10 Essential (primary) hypertension: Secondary | ICD-10-CM | POA: Diagnosis present

## 2016-06-23 DIAGNOSIS — Z9889 Other specified postprocedural states: Secondary | ICD-10-CM

## 2016-06-23 DIAGNOSIS — F909 Attention-deficit hyperactivity disorder, unspecified type: Secondary | ICD-10-CM | POA: Diagnosis present

## 2016-06-23 DIAGNOSIS — F17210 Nicotine dependence, cigarettes, uncomplicated: Secondary | ICD-10-CM | POA: Diagnosis present

## 2016-06-23 DIAGNOSIS — I998 Other disorder of circulatory system: Secondary | ICD-10-CM

## 2016-06-23 DIAGNOSIS — R262 Difficulty in walking, not elsewhere classified: Secondary | ICD-10-CM

## 2016-06-23 DIAGNOSIS — I70261 Atherosclerosis of native arteries of extremities with gangrene, right leg: Secondary | ICD-10-CM | POA: Diagnosis present

## 2016-06-23 DIAGNOSIS — I7409 Other arterial embolism and thrombosis of abdominal aorta: Secondary | ICD-10-CM | POA: Diagnosis not present

## 2016-06-23 DIAGNOSIS — L97519 Non-pressure chronic ulcer of other part of right foot with unspecified severity: Secondary | ICD-10-CM | POA: Diagnosis present

## 2016-06-23 DIAGNOSIS — D72829 Elevated white blood cell count, unspecified: Secondary | ICD-10-CM | POA: Diagnosis present

## 2016-06-23 DIAGNOSIS — M79605 Pain in left leg: Secondary | ICD-10-CM

## 2016-06-23 DIAGNOSIS — Z9189 Other specified personal risk factors, not elsewhere classified: Secondary | ICD-10-CM

## 2016-06-23 HISTORY — DX: Peripheral vascular disease, unspecified: I73.9

## 2016-06-23 HISTORY — DX: Essential (primary) hypertension: I10

## 2016-06-23 LAB — CBC WITH DIFFERENTIAL/PLATELET
BASOS PCT: 0 %
Basophils Absolute: 0 10*3/uL (ref 0.0–0.1)
EOS ABS: 0.2 10*3/uL (ref 0.0–0.7)
EOS PCT: 2 %
HCT: 38.2 % (ref 36.0–46.0)
HEMOGLOBIN: 12.7 g/dL (ref 12.0–15.0)
LYMPHS ABS: 4.4 10*3/uL — AB (ref 0.7–4.0)
Lymphocytes Relative: 34 %
MCH: 31.8 pg (ref 26.0–34.0)
MCHC: 33.2 g/dL (ref 30.0–36.0)
MCV: 95.7 fL (ref 78.0–100.0)
MONOS PCT: 11 %
Monocytes Absolute: 1.4 10*3/uL — ABNORMAL HIGH (ref 0.1–1.0)
NEUTROS PCT: 53 %
Neutro Abs: 7 10*3/uL (ref 1.7–7.7)
PLATELETS: 412 10*3/uL — AB (ref 150–400)
RBC: 3.99 MIL/uL (ref 3.87–5.11)
RDW: 13.3 % (ref 11.5–15.5)
WBC: 13 10*3/uL — ABNORMAL HIGH (ref 4.0–10.5)

## 2016-06-23 LAB — SEDIMENTATION RATE: Sed Rate: 54 mm/hr — ABNORMAL HIGH (ref 0–22)

## 2016-06-23 LAB — APTT: aPTT: 26 seconds (ref 24–36)

## 2016-06-23 LAB — BASIC METABOLIC PANEL
Anion gap: 18 — ABNORMAL HIGH (ref 5–15)
BUN: 41 mg/dL — ABNORMAL HIGH (ref 6–20)
CALCIUM: 10.3 mg/dL (ref 8.9–10.3)
CHLORIDE: 102 mmol/L (ref 101–111)
CO2: 16 mmol/L — ABNORMAL LOW (ref 22–32)
CREATININE: 1.35 mg/dL — AB (ref 0.44–1.00)
GFR, EST AFRICAN AMERICAN: 53 mL/min — AB (ref >60)
GFR, EST NON AFRICAN AMERICAN: 46 mL/min — AB (ref >60)
Glucose, Bld: 97 mg/dL (ref 65–99)
Potassium: 4 mmol/L (ref 3.5–5.1)
SODIUM: 136 mmol/L (ref 135–145)

## 2016-06-23 LAB — TSH: TSH: 0.295 u[IU]/mL — ABNORMAL LOW (ref 0.350–4.500)

## 2016-06-23 LAB — PROTIME-INR
INR: 1.08
PROTHROMBIN TIME: 14 s (ref 11.4–15.2)

## 2016-06-23 MED ORDER — SODIUM CHLORIDE 0.9 % IV SOLN
INTRAVENOUS | Status: DC
  Administered 2016-06-23 – 2016-06-26 (×5): via INTRAVENOUS

## 2016-06-23 MED ORDER — SODIUM CHLORIDE 0.9% FLUSH
3.0000 mL | Freq: Two times a day (BID) | INTRAVENOUS | Status: DC
  Administered 2016-06-23 – 2016-07-02 (×14): 3 mL via INTRAVENOUS

## 2016-06-23 MED ORDER — GABAPENTIN 600 MG PO TABS
600.0000 mg | ORAL_TABLET | Freq: Three times a day (TID) | ORAL | Status: DC
  Administered 2016-06-23 – 2016-06-27 (×13): 600 mg via ORAL
  Filled 2016-06-23 (×13): qty 1

## 2016-06-23 MED ORDER — ACETAMINOPHEN 650 MG RE SUPP: 650.0000 mg | Freq: Four times a day (QID) | RECTAL | Status: DC | PRN

## 2016-06-23 MED ORDER — ACETAMINOPHEN 325 MG PO TABS
650.0000 mg | ORAL_TABLET | Freq: Four times a day (QID) | ORAL | Status: DC | PRN
  Administered 2016-06-29: 650 mg via ORAL
  Filled 2016-06-23: qty 2

## 2016-06-23 MED ORDER — NICOTINE 21 MG/24HR TD PT24
21.0000 mg | MEDICATED_PATCH | Freq: Every day | TRANSDERMAL | Status: DC
  Administered 2016-06-24 – 2016-06-25 (×2): 21 mg via TRANSDERMAL
  Filled 2016-06-23 (×2): qty 1

## 2016-06-23 MED ORDER — HYDROMORPHONE HCL 1 MG/ML IJ SOLN
1.0000 mg | INTRAMUSCULAR | Status: AC | PRN
  Administered 2016-06-23 (×2): 1 mg via INTRAVENOUS
  Filled 2016-06-23 (×3): qty 1

## 2016-06-23 MED ORDER — SODIUM CHLORIDE 0.9 % IV BOLUS (SEPSIS)
1000.0000 mL | Freq: Once | INTRAVENOUS | Status: AC
  Administered 2016-06-23: 1000 mL via INTRAVENOUS

## 2016-06-23 MED ORDER — DOCUSATE SODIUM 100 MG PO CAPS
100.0000 mg | ORAL_CAPSULE | Freq: Two times a day (BID) | ORAL | Status: DC
  Administered 2016-06-23 – 2016-06-27 (×7): 100 mg via ORAL
  Filled 2016-06-23 (×9): qty 1

## 2016-06-23 MED ORDER — ONDANSETRON HCL 4 MG/2ML IJ SOLN: 4.0000 mg | Freq: Four times a day (QID) | INTRAMUSCULAR | Status: DC | PRN

## 2016-06-23 MED ORDER — ONDANSETRON HCL 4 MG PO TABS: 4.0000 mg | ORAL_TABLET | Freq: Four times a day (QID) | ORAL | Status: DC | PRN

## 2016-06-23 MED ORDER — TRAMADOL HCL 50 MG PO TABS
50.0000 mg | ORAL_TABLET | Freq: Four times a day (QID) | ORAL | Status: DC | PRN
  Administered 2016-06-24 – 2016-07-03 (×2): 50 mg via ORAL
  Filled 2016-06-23 (×2): qty 1

## 2016-06-23 MED ORDER — METHOCARBAMOL 500 MG PO TABS
500.0000 mg | ORAL_TABLET | Freq: Three times a day (TID) | ORAL | Status: DC
  Administered 2016-06-23 – 2016-07-03 (×22): 500 mg via ORAL
  Filled 2016-06-23 (×24): qty 1

## 2016-06-23 MED ORDER — LISDEXAMFETAMINE DIMESYLATE 20 MG PO CAPS
40.0000 mg | ORAL_CAPSULE | Freq: Every day | ORAL | Status: DC | PRN
  Filled 2016-06-23: qty 2

## 2016-06-23 MED ORDER — IOPAMIDOL (ISOVUE-370) INJECTION 76%
INTRAVENOUS | Status: AC
  Administered 2016-06-23: 100 mL
  Filled 2016-06-23: qty 100

## 2016-06-23 MED ORDER — HYDROMORPHONE HCL 1 MG/ML IJ SOLN
1.0000 mg | Freq: Once | INTRAMUSCULAR | Status: AC
  Administered 2016-06-23: 1 mg via INTRAVENOUS
  Filled 2016-06-23: qty 1

## 2016-06-23 MED ORDER — ENOXAPARIN SODIUM 40 MG/0.4ML ~~LOC~~ SOLN
40.0000 mg | SUBCUTANEOUS | Status: DC
  Administered 2016-06-23 – 2016-06-26 (×4): 40 mg via SUBCUTANEOUS
  Filled 2016-06-23 (×4): qty 0.4

## 2016-06-23 NOTE — Consult Note (Signed)
Requesting provider: Russella DarAllison L Ellis, NP ([facility]) Consulting cardiologist: Dr. Jonelle SidleSamuel G. Brittiney Dicostanzo  Reason for consultation: Preoperative evaluation  Clinical Summary Ms. Vilinda BoehringerWhitmore is a 50 y.o.female with past history outlined below, currently being admitted for further management of symptomatic PAD. She has been found to have an infrarenal aortic occlusion with reconstituted bilateral external iliac arteries and intact bilateral femoral-popliteal perfusion and three-vessel runoff. She reports low back pain with claudication, also some neuropathic symptoms. She has had blue discoloration of her toes, black ischemic zone involving her right great toe. Evaluation by Dr. Imogene Burnhen with [facility] noted. Possible aortobifemoral bypass is being considered.  She has no known personal history of obstructive CAD or myocardial infarction. Additional history does include hypertension as well as tobacco abuse and family history of CAD. She is fairly sedentary, does not report any angina symptoms with very basic ADLs. She has had some other recent orthopedic injuries that have limited her ambulation. Recent ECGs reviewed showing sinus tachycardia with PACs, no significant ST segment changes. She has not undergone any formal ischemic testing.   Allergies  Allergen Reactions  . Penicillins Anaphylaxis    Has patient had a PCN reaction causing immediate rash, facial/tongue/throat swelling, SOB or lightheadedness with hypotension: Yes Has patient had a PCN reaction causing severe rash involving mucus membranes or skin necrosis: No Has patient had a PCN reaction that required hospitalization Yes Has patient had a PCN reaction occurring within the last 10 years: No If all of the above answers are "NO", then may proceed with Cephalosporin use.   . Benadryl [Diphenhydramine Hcl] Other (See Comments)    syncope  . Codeine     Reflux. Pt states not allergic to hydrocodone and oxycodone .  Marland Kitchen. Other Other (See  Comments)    "I can't take anything with "cillins" on the end of it" - Anaphylaxis  . Celebrex [Celecoxib] Nausea And Vomiting and Rash  . Keflex [Cephalexin] Nausea And Vomiting and Rash    Projectile vomiting, thrush, fever blisters, ED visit  . Lidocaine Swelling and Rash    Foot swelled up like a balloon    Home Medications No current facility-administered medications on file prior to encounter.    Current Outpatient Prescriptions on File Prior to Encounter  Medication Sig Dispense Refill  . ibuprofen (ADVIL,MOTRIN) 200 MG tablet Take 800 mg by mouth 3 (three) times daily.     Marland Kitchen. lisdexamfetamine (VYVANSE) 40 MG capsule Take 40 mg by mouth daily as needed. ADD.    Marland Kitchen. lisinopril-hydrochlorothiazide (PRINZIDE,ZESTORETIC) 20-25 MG tablet Take 1 tablet by mouth daily.    . metaxalone (SKELAXIN) 400 MG tablet Take 1 tablet (400 mg total) by mouth 3 (three) times daily as needed for muscle spasms. (Patient not taking: Reported on 06/23/2016) 10 tablet 0     Past Medical History:  Diagnosis Date  . Anxiety   . Attention deficit disorder   . Essential hypertension   . PAD (peripheral artery disease) (HCC)    Infrarenal aortic occlusion  . Temporomandibular joint disorder     Past Surgical History:  Procedure Laterality Date  . MENISCUS REPAIR Left     Family History  Problem Relation Age of Onset  . COPD Mother   . CAD Mother   . CAD Maternal Grandmother   . Diabetes Maternal Uncle     Social History Ms. Vilinda BoehringerWhitmore reports that she has been smoking Cigarettes.  She has been smoking about 0.25 packs per day. She has never used  smokeless tobacco. Ms. Strong reports that she does not drink alcohol.  Review of Systems Complete review of systems negative except as otherwise outlined in the clinical summary and also the following. Reported fractured fifth left toe a few months ago.  Physical Examination Blood pressure 99/59, pulse 96, temperature 97.8 F (36.6 C), temperature  source Oral, resp. rate 14, last menstrual period 06/11/2016, SpO2 95 %. No intake or output data in the 24 hours ending 06/23/16 1742  Telemetry: Sinus rhythm.   Gen.: Obese woman in no distress. HEENT: Conjunctiva and lids normal, oropharynx clear with poor dentition. Neck: Supple, no elevated JVP or carotid bruits, no thyromegaly. Lungs: Clear to auscultation, nonlabored breathing at rest. Cardiac: Regular rate and rhythm, no S3 or significant systolic murmur, no pericardial rub. Abdomen: Soft, nontender, bowel sounds present, no guarding or rebound. Extremities: Black ischemic zone right great toe, blue discoloration of other toes on the left to lesser degree. Significantly diminished distal pulses. Skin: Warm and dry. Musculoskeletal: No kyphosis. Neuropsychiatric: Alert and oriented x3, affect grossly appropriate.  Lab Results  Basic Metabolic Panel:  Recent Labs Lab 06/18/16 1818 06/23/16 1340  NA 137 136  K 4.2 4.0  CL 105 102  CO2 17* 16*  GLUCOSE 121* 97  BUN 26* 41*  CREATININE 1.52* 1.35*  CALCIUM 9.4 10.3    CBC:  Recent Labs Lab 06/18/16 1818 06/23/16 1340  WBC 21.5* 13.0*  NEUTROABS 15.6* 7.0  HGB 13.5 12.7  HCT 38.8 38.2  MCV 92.8 95.7  PLT 344 412*    Imaging Chest x-ray 06/18/2016: FINDINGS: The heart size and mediastinal contours are within normal limits. Both lungs are clear. The visualized skeletal structures are unremarkable.  IMPRESSION: No active cardiopulmonary disease.  Impression  1. Preoperative evaluation a 50 year old woman with ongoing tobacco abuse, family history of CAD, hypertension, and symptomatic PAD with occluded infrarenal aorta. She is being considered for probable aortobifemoral bypass (nonemergent). ECG is nonspecific. She does not report any angina although is fairly sedentary. She has not undergone any previous ischemic testing.  2. Essential hypertension by history, on lisinopril as an outpatient.  3. Ongoing  tobacco abuse.  Recommendations  Lexiscan Myoview will be ordered for objective ischemic evaluation prior to considering vascular surgery. Would start aspirin, continue lisinopril, check lipid panel. Smoking cessation will be critical. Further recommendations to follow.   Jonelle Sidle, M.D., F.A.C.C.

## 2016-06-23 NOTE — ED Triage Notes (Signed)
Pt to ED with c/o low back pain with numbness in right leg and foot-- with occasional bladder incontinence -- right great toe is black, foot is cool.

## 2016-06-23 NOTE — Consult Note (Signed)
Established Critical Limb Ischemia Patient  History of Present Illness  Frances Graham is a 50 y.o. ([birth year]) female who presents with chief complaint: R leg pain and loss of bladder control.  This patient was seen by Dr. Edilia Boickson just recently on 06/18/16.  Since then the patient has developed R leg pain consistent with prior episodes of sciatica.  This pain has a neuropathic character to it with changing character involving sharp, burning, and paraesthesia.  She also notes weakness in her R leg and has fallen once recently.  I referred her to the ED for evaluation given the concern with sx suggestive of possible R acute limb ischemia.  The patient has had months of rest pain in right leg and R great toe ischemic skin changes also with skin color changes in sever of her L foot toe.  The patient notes a long standing history of back pain.    The patient's PMH, PSH, SH, and FamHx are unchanged from 06/18/16.  Current Facility-Administered Medications  Medication Dose Route Frequency Provider Last Rate Last Dose  . 0.9 %  sodium chloride infusion   Intravenous Continuous Russella DarAllison L Ellis, NP      . HYDROmorphone (DILAUDID) injection 1 mg  1 mg Intravenous Q30 min PRN Linwood DibblesJon Knapp, MD   1 mg at 06/23/16 1427   Current Outpatient Prescriptions  Medication Sig Dispense Refill  . gabapentin (NEURONTIN) 600 MG tablet Take 600 mg by mouth 3 (three) times daily.    Marland Kitchen. ibuprofen (ADVIL,MOTRIN) 200 MG tablet Take 800 mg by mouth 3 (three) times daily.     Marland Kitchen. lisdexamfetamine (VYVANSE) 40 MG capsule Take 40 mg by mouth daily as needed. ADD.    Marland Kitchen. lisinopril-hydrochlorothiazide (PRINZIDE,ZESTORETIC) 20-25 MG tablet Take 1 tablet by mouth daily.    . methocarbamol (ROBAXIN) 500 MG tablet Take 500 mg by mouth 3 (three) times daily. May take an extra 500 mg at night, as needed    . metaxalone (SKELAXIN) 400 MG tablet Take 1 tablet (400 mg total) by mouth 3 (three) times daily as needed for muscle spasms.  (Patient not taking: Reported on 06/23/2016) 10 tablet 0    Allergies  Allergen Reactions  . Penicillins Anaphylaxis    Has patient had a PCN reaction causing immediate rash, facial/tongue/throat swelling, SOB or lightheadedness with hypotension: Yes Has patient had a PCN reaction causing severe rash involving mucus membranes or skin necrosis: No Has patient had a PCN reaction that required hospitalization Yes Has patient had a PCN reaction occurring within the last 10 years: No If all of the above answers are "NO", then may proceed with Cephalosporin use.   . Benadryl [Diphenhydramine Hcl] Other (See Comments)    syncope  . Codeine     Reflux. Pt states not allergic to hydrocodone and oxycodone .  Marland Kitchen. Other Other (See Comments)    "I can't take anything with "cillins" on the end of it" - Anaphylaxis  . Celebrex [Celecoxib] Nausea And Vomiting and Rash  . Keflex [Cephalexin] Nausea And Vomiting and Rash    Projectile vomiting, thrush, fever blisters, ED visit  . Lidocaine Swelling and Rash    Foot swelled up like a balloon    On ROS today: episode of uncontrolled urination, R leg neuropathic sx   Physical Examination  Vitals:   06/23/16 1415 06/23/16 1430 06/23/16 1506 06/23/16 1515  BP: 128/84 109/65 99/59   Pulse: 109 101 94 96  Resp: 17 14 16 14   Temp:  TempSrc:      SpO2: 96% 99% 100% 95%    There is no height or weight on file to calculate BMI.  General: Alert, O x 3, Obese,Ill appearing  Pulmonary: Sym exp, good B air movt,CTA B  Cardiac: RRR, Nl S1, S2, no Murmurs, No rubs, No S3,S4  Vascular: Vessel Right Left  Radial Palpable Palpable  Brachial Palpable Palpable  Carotid Palpable, No Bruit Palpable, No Bruit  Aorta Not palpable N/A  Femoral Not palpable Not palpable  Popliteal Not palpable Not palpable  PT Not palpable Not palpable  DP Not palpable Not palpable   Gastrointestinal: soft, non-distended, non-tender to palpation, No guarding or  rebound, no HSM, no masses, no CVAT B, No palpable prominent aortic pulse,    Musculoskeletal: BUE MS 5/5, LLE 4-5/5, RLE initially 0/5 on repeat exam 1-2/5, Extremities without ischemic changes except R great toe looks ischemic with some dry gangrenous changes, No edema present,   Neurologic: CN 2-12 intact , Pain and light touch intact in extremities except R>>L sensory decrease, Motor exam as listed above  Radiology: CTA Abd/Pelvis with BRo (06/23/2016): official report pending  Based on my review of the CTA, this patient has an infrarenal occlusion with what appears to be continue perfusion of IMA.  There is reconstitution of B EIA and all distally vascularature down to the level of the ankle.  Laboratory: CBC:    Component Value Date/Time   WBC 13.0 (H) 06/23/2016 1340   RBC 3.99 06/23/2016 1340   HGB 12.7 06/23/2016 1340   HCT 38.2 06/23/2016 1340   PLT 412 (H) 06/23/2016 1340   MCV 95.7 06/23/2016 1340   MCH 31.8 06/23/2016 1340   MCHC 33.2 06/23/2016 1340   RDW 13.3 06/23/2016 1340   LYMPHSABS 4.4 (H) 06/23/2016 1340   MONOABS 1.4 (H) 06/23/2016 1340   EOSABS 0.2 06/23/2016 1340   BASOSABS 0.0 06/23/2016 1340    BMP:    Component Value Date/Time   NA 136 06/23/2016 1340   K 4.0 06/23/2016 1340   CL 102 06/23/2016 1340   CO2 16 (L) 06/23/2016 1340   GLUCOSE 97 06/23/2016 1340   BUN 41 (H) 06/23/2016 1340   CREATININE 1.35 (H) 06/23/2016 1340   CALCIUM 10.3 06/23/2016 1340   GFRNONAA 46 (L) 06/23/2016 1340   GFRAA 53 (L) 06/23/2016 1340    Coagulation: Lab Results  Component Value Date   INR 1.08 06/23/2016   INR 1.00 06/18/2016   No results found for: PTT  Lipids: No results found for: CHOL, TRIG, HDL, CHOLHDL, VLDL, LDLCALC, LDLDIRECT  Non-invasive Studies  ABI (Date: 06/23/2016)  ordered   Medical Decision Making  Frances Graham is a 50 y.o. 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  I doubt that this occlusion occurred in the last 5 days, which is the time frame of this patient's R leg neuropathic sx.  This suggests a possible second etiology for this patient's sx. I will defer further work-up to the Hospitalist service.  This patient's anatomy would require an aortobifemoral bypass to revascularize the patient.  An elective ABF has a 5-10% mortality in the [employer].  It is well established that the risk of mortality doubles in emergent procedures.  Thus I would  NOT proceed with an emergent aortobifemoral bypass.  The patient has expressed no interest in proceeding immediately.  Unfortunately, infrarenal aortic occlusion in a young patient is a subset of aggressive atherosclerosis.    Aggressive preoperative optimization including cardiac work-up is essential to trying to minimal her risk for subsequent revascularization.  The patient is going to be admitted to the Hospitalist service to facilitate this work-up.  Dr. Edilia Bo will be notified of her admission and will resume care in the AM.  Thank you for allowing [place] to participate in this patient's care.   Leonides Sake, MD, FACS [facility] [place] Office: [phone] Pager: [phone]

## 2016-06-23 NOTE — ED Provider Notes (Signed)
[facility]-EMERGENCY DEPT Provider Note   CSN: [id number] Arrival date & time: 06/23/16  1253     History   Chief Complaint Chief Complaint  Patient presents with  . Back Pain  . foot numb    HPI Frances Graham is a 50 y.o. female.  HPI Patient presents to the emergency room today with complaints of severe, persistent, worsening low back pain radiating down her left leg associated with numbness in both legs and cyanosis in her toes. Patient has been seen several times in the past week and a half for these complaints. Patient was initially seen on September 26. Patient was noted to have cyanotic right great toe and left great toe.  They were unable to palpate pulses. Vascular surgery was consulted.  The patient was seen by Dr. Edilia Bo. He felt that the patient had multilevel arterial occlusive disease. He felt that the patient requires CT angiogram to evaluate further however because of her renal insufficiency he requested nephrology consult first.  Patient was subsequently back in the emergency room on the 27th. At that time she was complaining of pain going down her right leg. It was a sharp pain.  Patient states since that time she's been having some intermittent trouble with weakness in both of her legs. She describes episodes of incontinence. Patient states she is having sharp pain more in the right leg today. It was in the left leg before and increases with movement. She feels that the cyanosis is also getting worse. He called Dr. Imogene Burn who is on-call for vascular surgery and was told to come to the emergency room. Past Medical History:  Diagnosis Date  . Anxiety   . Attention deficit disorder   . Chest pain   . Temporomandibular joint disorder     There are no active problems to display for this patient.   History reviewed. No pertinent surgical history.  OB History    No data available       Home Medications    Prior to Admission medications   Medication Sig Start Date  End Date Taking? Authorizing Provider  gabapentin (NEURONTIN) 600 MG tablet Take 600 mg by mouth 3 (three) times daily.   Yes Historical Provider, MD  ibuprofen (ADVIL,MOTRIN) 200 MG tablet Take 800 mg by mouth 3 (three) times daily.    Yes Historical Provider, MD  lisdexamfetamine (VYVANSE) 40 MG capsule Take 40 mg by mouth daily as needed. ADD.   Yes Historical Provider, MD  lisinopril-hydrochlorothiazide (PRINZIDE,ZESTORETIC) 20-25 MG tablet Take 1 tablet by mouth daily.   Yes Historical Provider, MD  methocarbamol (ROBAXIN) 500 MG tablet Take 500 mg by mouth 3 (three) times daily. May take an extra 500 mg at night, as needed   Yes Historical Provider, MD  metaxalone (SKELAXIN) 400 MG tablet Take 1 tablet (400 mg total) by mouth 3 (three) times daily as needed for muscle spasms. Patient not taking: Reported on 06/23/2016 06/19/16   Benjiman Core, MD    Family History No family history on file.  Social History Social History  Substance Use Topics  . Smoking status: Current Every Day Smoker    Types: Cigarettes  . Smokeless tobacco: Never Used  . Alcohol use No     Allergies   Penicillins; Benadryl [diphenhydramine hcl]; Codeine; Other; Celebrex [celecoxib]; Keflex [cephalexin]; and Lidocaine   Review of Systems Review of Systems  All other systems reviewed and are negative.    Physical Exam Updated Vital Signs BP 99/59   Pulse 96  Temp 97.8 F (36.6 C) (Oral)   Resp 14   LMP 06/11/2016   SpO2 95%   Physical Exam  Constitutional: No distress.  Overweight  HENT:  Head: Normocephalic and atraumatic.  Right Ear: External ear normal.  Left Ear: External ear normal.  Eyes: Conjunctivae are normal. Right eye exhibits no discharge. Left eye exhibits no discharge. No scleral icterus.  Neck: Neck supple. No tracheal deviation present.  Cardiovascular: Normal rate, regular rhythm and intact distal pulses.   Unable to palpate pulses in her lower extremities, the  patient's, toes on both feet are cyanotic, unable to Doppler pulses in the distal aspect of her feet are cool  Pulmonary/Chest: Effort normal and breath sounds normal. No stridor. No respiratory distress. She has no wheezes. She has no rales.  Abdominal: Soft. Bowel sounds are normal. She exhibits no distension. There is no tenderness. There is no rebound and no guarding.  Musculoskeletal: She exhibits no edema or tenderness.  No spinal tenderness  Neurological: She is alert. A sensory deficit is present. No cranial nerve deficit (no facial droop, extraocular movements intact, no slurred speech). She exhibits normal muscle tone. She displays no seizure activity. Coordination normal.  Patient complains of decreased sensation bilateral lower extremities in a stocking pattern; patient is able to lift both legs off the bed complains of decrease strength in lower extremities  Skin: Skin is warm and dry. No rash noted.  Psychiatric: She has a normal mood and affect.  Nursing note and vitals reviewed.        ED Treatments / Results  Labs (all labs ordered are listed, but only abnormal results are displayed) Labs Reviewed  CBC WITH DIFFERENTIAL/PLATELET - Abnormal; Notable for the following:       Result Value   WBC 13.0 (*)    Platelets 412 (*)    Lymphs Abs 4.4 (*)    Monocytes Absolute 1.4 (*)    All other components within normal limits  BASIC METABOLIC PANEL - Abnormal; Notable for the following:    CO2 16 (*)    BUN 41 (*)    Creatinine, Ser 1.35 (*)    GFR calc non Af Amer 46 (*)    GFR calc Af Amer 53 (*)    Anion gap 18 (*)    All other components within normal limits  PROTIME-INR  APTT    EKG  EKG Interpretation  Date/Time:  Sunday June 23 2016 13:58:53 EDT Ventricular Rate:  100 PR Interval:    QRS Duration: 92 QT Interval:  324 QTC Calculation: 418 R Axis:   64 Text Interpretation:  Sinus tachycardia No significant change since last tracing Confirmed by Bona Hubbard   MD-J, Chelcea Zahn ([id number]) on 06/23/2016 2:32:54 PM       Radiology No results found.  Procedures Procedures (including critical care time)  Medications Ordered in ED Medications  HYDROmorphone (DILAUDID) injection 1 mg (1 mg Intravenous Given 06/23/16 1427)  HYDROmorphone (DILAUDID) injection 1 mg (1 mg Intravenous Given 06/23/16 1345)  iopamidol (ISOVUE-370) 76 % injection (100 mLs  Contrast Given 06/23/16 1442)  sodium chloride 0.9 % bolus 1,000 mL (1,000 mLs Intravenous New Bag/Given 06/23/16 1400)     Initial Impression / Assessment and Plan / ED Course  I have reviewed the triage vital signs and the nursing notes.  Pertinent labs & imaging results that were available during my care of the patient were reviewed by me and considered in my medical decision making (see chart for details).  Clinical Course  Comment By Time  Discussed case with Dr Imogene Burnhen.  Pt needs emergent ct angio abd pelvis with run off to evaluate for acute occlusion Linwood DibblesJon Chyan Carnero, MD 10/01 1445  Dr Imogene Burnhen reviewed the films.  Pt will need and aorto fem bypass but she has collateral flow bilaterally.  No acute occlusion requiring emergent surgery.  Requests medical admission for med clearance, optimization and anticipation of surgery this week Linwood DibblesJon Naftoli Penny, MD 10/01 1526     Final Clinical Impressions(s) / ED Diagnoses   Final diagnoses:  Peripheral vascular disease (HCC)      Linwood DibblesJon Parker Sawatzky, MD 06/23/16 1529

## 2016-06-23 NOTE — Progress Notes (Signed)
     I went to see patient in consult but she was gone for Xray. Will make NPO after midnight for lexiscan myoview in the AM for risk stratification for eventual PV surgery.  Cline CrockKathryn Yolanda Huffstetler PA-C  [facility]

## 2016-06-23 NOTE — ED Notes (Signed)
Patient transported to CT 

## 2016-06-23 NOTE — Progress Notes (Signed)
   Daily Progress Note  Full consult to follow.  Essentially this patient has a chronic infrarenal aortic occlusion as is evident with reconstituted bilateral external iliac arteries and intact bilateral femoropopliteal perfusion with 3 vessel runoff on both sides.  I doubt her acute sx are due to her infrarenal aortic occlusion.  Additionally work-up for the right leg weakness and sensory loss would be recommended.  While it is possible to have motor and sensation loss in the setting of limb ischemia, this patient's CTA clearly demonstrates perfusion all the way down to her ankle bilaterally.  - At some point she is going to need an aortobifemoral bypass, but not tonight.  I would do this electively as the national Medicare data suggests elective mortality for aortobifemoral bypass is already 5-10% (Emergency operations usually double the mortality rate.)  - As the patient keeps returning to the ED, the ED would like to admit the patient to the Hospitalist service for expedited medical work-up including Cardiology consultation for preop risk stratification and optimization.   Leonides SakeBrian Lashawnda Hancox, MD, FACS [facility] [place] Office: [phone] Pager: [phone]  06/23/2016, 3:38 PM

## 2016-06-23 NOTE — Progress Notes (Signed)
Per Dr. Lynelle DoctorKnapp and Vascular surgeon urgent case we need to proceed without labs.

## 2016-06-23 NOTE — H&P (Signed)
History and Physical    Dot Splinter JJO:[id number] DOB: [birth year] DOA: 06/23/2016   PCP: No PCP Per Patient Frances Graham  Patient coming from/Resides with: Private residence  Admission status: Inpatient/telemetry --medically necessary to stay a minimum 2 midnights to rule out impending and/or unexpected changes in physiologic status that may differ from initial evaluation performed in the ER and/or at time of admission.   Chief Complaint: Nonhealing ulcer right great toe; abnormal CT angiogram extremities requiring vascular intervention  HPI: Frances Graham is a 50 y.o. female with medical history significant for hypertension (apparent recent start on medications) and ADD. Patient reports about a 1 week history of worsening chronic low back pain, now more so with walking as well as new dark lesion on the right great toe as well as a smaller lesion on the left pinky toe with bluish/purplish discoloration of the toes of the right foot. Patient has presented to the ER on multiple occasions with these complaints since onset this past month. She was eventually evaluated by Dr. Scot Dock with vascular surgery who wanted to pursue CT angiogram but documented he was waiting on nephrology consultation regarding her renal insufficiency. She returned to the ER on the 27th now with pain in her right leg. She also has reported that she has also developed intermittent weakness in both of her legs and episodes of incontinence. She returns to the ER today complaining of more sharp pain in the right leg that worsens with activity and with increase in the discoloration. Prior to presenting to the ER she had called on-call vascular surgery physician Dr. Bridgett Larsson who recommended she come to the ER for evaluation.  Since arrival to the ER patient has undergone CT angiogram of the lower extremities which reveals a chronic infrarenal aortic occlusion with reconstituted bilateral external iliac arteries and intact bilateral  femoropopliteal perfusion with three-vessel runoff on both sides. She also clinically evaluated by Dr. Bridgett Larsson who did not feel the patient had limb threatening ischemia therefore she did not require urgent surgical intervention but will require surgical intervention likely during this hospitalization. Internal medicine consultation was requested for medical clearance. In addition he requested formal cardiology consultation for preop risk stratification and optimization.  In my discussion with the patient, she reports an extensive history of chronic back pain which has worsened lately as described above and has included bilateral lower extremity weakness and episodes of urinary incontinence in the past several days. She reports a history of riding horses for greater than 16 years with multiple injuries related to same. In the past her back pain has improved with chiropractor therapy.  ED Course:  Vital Signs: BP 99/59   Pulse 96   Temp 97.8 F (36.6 C) (Oral)   Resp 14   LMP 06/11/2016   SpO2 95%  CT angiogram aorta and bilateral femorals with without contrast: Formal result pending radiologist interpretation-Dr. Bridgett Larsson has interpreted the angiogram documents chronic infrarenal aortic occlusion with reconstituted bilateral external iliac arteries and intact bilateral femoropopliteal popliteal perfusion with three-vessel runoff on both sides. Lab data: Sodium 136, potassium 4.0, CO2 16, BUN 41, creatinine 1.35, GFR 46, anion gap 18, glucose 97, WBCs 13,000 with normal neutrophils and absolute neutrophils, hemoglobin 12.7, platelets 412,000, PT 14 INR 1.08, PTT 26 Medications and treatments: Dilaudid 1 mg IV 2 doses, 1 L normal saline bolus  Review of Systems:  In addition to the HPI above,  No Fever-chills, myalgias or other constitutional symptoms No Headache, changes with Vision or hearing, new weakness,  tingling, numbness in any extremity, dizziness, dysarthria or word finding difficulty, gait  disturbance or imbalance, tremors or seizure activity No problems swallowing food or Liquids, indigestion/reflux, choking or coughing while eating, abdominal pain with or after eating No Chest pain, Cough or Shortness of Breath, palpitations, orthopnea or DOE No Abdominal pain, N/V, melena,hematochezia, dark tarry stools No dysuria, malodorous urine, hematuria or flank pain No new skin rashes, lesions, masses or bruises, No new swelling or redness No recent unintentional weight gain or loss No polyuria, polydypsia or polyphagia   Past Medical History:  Diagnosis Date  . Anxiety   . Attention deficit disorder   . Essential hypertension   . PAD (peripheral artery disease) (HCC)    Infrarenal aortic occlusion  . Temporomandibular joint disorder     Past Surgical History:  Procedure Laterality Date  . MENISCUS REPAIR Left     Social History   Social History  . Marital status: Widowed    Spouse name: N/A  . Number of children: N/A  . Years of education: N/A   Occupational History  . Not on file.   Social History Main Topics  . Smoking status: Current Every Day Smoker    Packs/day: 0.25    Types: Cigarettes  . Smokeless tobacco: Never Used  . Alcohol use No  . Drug use: No  . Sexual activity: Not on file   Other Topics Concern  . Not on file   Social History Narrative  . No narrative on file    Mobility: Without assistive devices Work history: Has not worked in 7 years because she was caring for her mother who is dying of complications from CAD and COPD-her mother recently passed away   Allergies  Allergen Reactions  . Penicillins Anaphylaxis    Has patient had a PCN reaction causing immediate rash, facial/tongue/throat swelling, SOB or lightheadedness with hypotension: Yes Has patient had a PCN reaction causing severe rash involving mucus membranes or skin necrosis: No Has patient had a PCN reaction that required hospitalization Yes Has patient had a PCN  reaction occurring within the last 10 years: No If all of the above answers are "NO", then may proceed with Cephalosporin use.   . Benadryl [Diphenhydramine Hcl] Other (See Comments)    syncope  . Codeine     Reflux. Pt states not allergic to hydrocodone and oxycodone .  Marland Kitchen Other Other (See Comments)    "I can't take anything with "cillins" on the end of it" - Anaphylaxis  . Celebrex [Celecoxib] Nausea And Vomiting and Rash  . Keflex [Cephalexin] Nausea And Vomiting and Rash    Projectile vomiting, thrush, fever blisters, ED visit  . Lidocaine Swelling and Rash    Foot swelled up like a balloon    Family History  Problem Relation Age of Onset  . COPD Mother   . CAD Mother   . CAD Maternal Grandmother   . Diabetes Maternal Uncle      Prior to Admission medications   Medication Sig Start Date End Date Taking? Authorizing Provider  gabapentin (NEURONTIN) 600 MG tablet Take 600 mg by mouth 3 (three) times daily.   Yes Historical Provider, MD  ibuprofen (ADVIL,MOTRIN) 200 MG tablet Take 800 mg by mouth 3 (three) times daily.    Yes Historical Provider, MD  lisdexamfetamine (VYVANSE) 40 MG capsule Take 40 mg by mouth daily as needed. ADD.   Yes Historical Provider, MD  lisinopril-hydrochlorothiazide (PRINZIDE,ZESTORETIC) 20-25 MG tablet Take 1 tablet by mouth daily.  Yes Historical Provider, MD  methocarbamol (ROBAXIN) 500 MG tablet Take 500 mg by mouth 3 (three) times daily. May take an extra 500 mg at night, as needed   Yes Historical Provider, MD  metaxalone (SKELAXIN) 400 MG tablet Take 1 tablet (400 mg total) by mouth 3 (three) times daily as needed for muscle spasms. Patient not taking: Reported on 06/23/2016 06/19/16   Davonna Belling, MD    Physical Exam: Vitals:   06/23/16 1415 06/23/16 1430 06/23/16 1506 06/23/16 1515  BP: 128/84 109/65 99/59   Pulse: 109 101 94 96  Resp: _0 Temp:      TempSrc:      SpO2: 96% 99% 100% 95%      Constitutional: NAD, calm,  uncomfortable-she is reporting issues with low back pain Eyes: PERRL, lids and conjunctivae normal ENMT: Mucous membranes are moist. Posterior pharynx clear of any exudate or lesions.Normal dentition.  Neck: normal, supple, no masses, no thyromegaly Respiratory: clear to auscultation bilaterally, no wheezing, no crackles. Normal respiratory effort. No accessory muscle use.  Cardiovascular: Regular rate and rhythm, no murmurs / rubs / gallops. No extremity edema. Very diminished left pedal pulses; unable to palpate right dorsalis pedal or posterior tibialis pulse . No carotid bruits.  Abdomen: no tenderness, no masses palpated. No hepatosplenomegaly. Bowel sounds positive.  Musculoskeletal: no clubbing / cyanosis. No joint deformity upper and lower extremities. Good ROM, no contractures. Normal muscle tone.  Skin: no rashes, lesions, ulcers. No induration-dry gangrene involving right great toe and plantar surface of left pinky toe; skin on right foot dusky from mid foot down and foot itself is cool to touch although not cold-and station intact Neurologic: CN 2-12 grossly intact. Sensation intact, DTR normal. Strength 5/5 x all 4 extremities. Mild positive bilateral straight leg raise when tested  Psychiatric: Normal judgment and insight. Alert and oriented x 3. Normal mood.    Labs on Admission: I have personally reviewed following labs and imaging studies  CBC:  Recent Labs Lab 06/18/16 1818 06/23/16 1340  WBC 21.5* 13.0*  NEUTROABS 15.6* 7.0  HGB 13.5 12.7  HCT 38.8 38.2  MCV 92.8 95.7  PLT 344 681*   Basic Metabolic Panel:  Recent Labs Lab 06/18/16 1818 06/23/16 1340  NA 137 136  K 4.2 4.0  CL 105 102  CO2 17* 16*  GLUCOSE 121* 97  BUN 26* 41*  CREATININE 1.52* 1.35*  CALCIUM 9.4 10.3   GFR: Estimated Creatinine Clearance: 59.5 mL/min (by C-G formula based on SCr of 1.35 mg/dL (H)). Liver Function Tests: No results for input(s): AST, ALT, ALKPHOS, BILITOT, PROT,  ALBUMIN in the last 168 hours. No results for input(s): LIPASE, AMYLASE in the last 168 hours. No results for input(s): AMMONIA in the last 168 hours. Coagulation Profile:  Recent Labs Lab 06/18/16 1818 06/23/16 1352  INR 1.00 1.08   Cardiac Enzymes: No results for input(s): CKTOTAL, CKMB, CKMBINDEX, TROPONINI in the last 168 hours. BNP (last 3 results) No results for input(s): PROBNP in the last 8760 hours. HbA1C: No results for input(s): HGBA1C in the last 72 hours. CBG: No results for input(s): GLUCAP in the last 168 hours. Lipid Profile: No results for input(s): CHOL, HDL, LDLCALC, TRIG, CHOLHDL, LDLDIRECT in the last 72 hours. Thyroid Function Tests: No results for input(s): TSH, T4TOTAL, FREET4, T3FREE, THYROIDAB in the last 72 hours. Anemia Panel: No results for input(s): VITAMINB12, FOLATE, FERRITIN, TIBC, IRON, RETICCTPCT in the last 72 hours. Urine analysis:  Component Value Date/Time   COLORURINE YELLOW 06/18/2016 2105   APPEARANCEUR CLOUDY (A) 06/18/2016 2105   LABSPEC 1.022 06/18/2016 2105   PHURINE 5.5 06/18/2016 2105   GLUCOSEU 100 (A) 06/18/2016 2105   HGBUR SMALL (A) 06/18/2016 2105   BILIRUBINUR NEGATIVE 06/18/2016 2105   KETONESUR NEGATIVE 06/18/2016 2105   PROTEINUR 30 (A) 06/18/2016 2105   UROBILINOGEN 0.2 08/08/2013 1430   NITRITE NEGATIVE 06/18/2016 2105   LEUKOCYTESUR NEGATIVE 06/18/2016 2105   Sepsis Labs: _0 (procalcitonin:4,lacticidven:4) )No results found for this or any previous visit (from the past 240 hour(s)).   Radiological Exams on Admission: No results found.  EKG: (Independently reviewed) Sinus tachycardia with ventricular rate 100 bpm, QTC 418 ms, normal R-wave progression, J-point elevation (consistent with early repolarization) most notable in leads V5 and 6 as well as the inferior leads but no evidence of ischemic change  Assessment/Plan Principal Problem:   Aortoiliac occlusive disease/ Peripheral vascular disease    -Patient presents with newly diagnosed aorto occlusive iliac disease requiring eventual revascularization; evaluated by [facility] who requests internal medicine admission and medical clearance -Start baby aspirin -Status post contrasted imaging today so give IV fluids -Risk factors for PVD include: ongoing tobacco abuse and hypertension-lipid status unknown -Check hemoglobin A1c and TSH; other labs as below -Tylenol for mild lef pain- Ultram for moderate leg pain- no NSAIDs 2/2 AKI- cont neuropathic agents as below  Active Problems:   Acute kidney injury  -Baseline renal function in 2014: 8/0.64 -Since that time patient has been started on an ACE inhibitor with a thiazide diuretic and current renal function is 41/1.35 -Patient appears to have issues with prerenal azotemia and maybe mild ATN in setting of above-mentioned medication -Hold ACE inhibitor and thiazide diuretic -IV fluids at 75 mL per hour for at least 24 hours post receipt of IV contrast -Follow labs -If rehydration does not improve renal function will need renal ultrasound and formal nephrology consultation  -Hold preadmission Advil    Ischemic ulcer of foot/Leukocytosis -No erythema or drainage; no issues with fevers -Ulcers appear consistent with dry gangrene and are quite localized -For completeness of exam especially with plans for revascularization procedure will obtain blood cultures to rule out occult bacteremia -Check ESR    Cardiovascular risk factors -[facility] has requested formal cardiovascular clearance preoperatively -Personal risk factors include age greater than 45, positive family history CAD, hypertension, ongoing tobacco abuse -Echocardiogram -EKG unremarkable -Patient asymptomatic and denies issues with chest pain or dyspnea on exertion -Discussed with cardiology; they will formally see in a.m. but have made patient nothing by mouth after midnight for Myoview stress test to be done on 10/2    HTN  (hypertension) -New diagnosis in the past year -Patient appears to be volume depleted and most recent blood pressures are suboptimal with systolic readings in the 24M -Hold antihypertensive agents as above -Follow up on recovery of renal function in the event preadmission ACE inhibitor/diuretic needs to be altered or discontinued    Tobacco abuse -Patient reports smoking 5 cigarettes per day and has been smoking for greater than 25 years -Cessation counseling initiated -Nicotine patch    Chronic low back pain -Patient reports multiple years of chronic low back pain that she relates to riding horses and having injuries related to that hobby -Likely her aortoiliac disease explains her symptoms especially with recent issues with bilateral leg weakness and incontinence -For completeness of exam will obtain plain lumbar films to rule out any lumbar disc disease; may need lumbar CT  or MRI to better clarify -Continue preadmission Neurontin and Robaxin    Attention deficit disorder -Continue Vyvanse      DVT prophylaxis: Lovenox  Code Status: Full Family Communication: No family at bedside Disposition Plan: Anticipate discharge back to preadmission home environment once medically stable Consults called: [facility]/Dr. Bridgett Larsson; Cardiology/ [facility] on call MD     Samella Parr ANP-BC [facility] Pager [phone]   If 7PM-7AM, please contact night-coverage [email or web address] Password [facility]  06/23/2016, 4:47 PM

## 2016-06-23 NOTE — ED Notes (Signed)
Pt states, "When I stood up my legs collapsed under me and I lost control of my bladder."  This RN with Marylene LandAngela C-T, RN, assisted pt successfully to use bedpan to meet toileting needs.  This RN discussed risk of falls with pt who expressed understanding.  Pt agrees to stay in bed and use call bell.

## 2016-06-24 ENCOUNTER — Inpatient Hospital Stay (HOSPITAL_COMMUNITY): Payer: Medicaid Other

## 2016-06-24 DIAGNOSIS — I739 Peripheral vascular disease, unspecified: Secondary | ICD-10-CM

## 2016-06-24 DIAGNOSIS — Z01818 Encounter for other preprocedural examination: Secondary | ICD-10-CM

## 2016-06-24 LAB — COMPREHENSIVE METABOLIC PANEL
ALT: 26 U/L (ref 14–54)
AST: 25 U/L (ref 15–41)
Albumin: 3.4 g/dL — ABNORMAL LOW (ref 3.5–5.0)
Alkaline Phosphatase: 74 U/L (ref 38–126)
Anion gap: 12 (ref 5–15)
BILIRUBIN TOTAL: 0.6 mg/dL (ref 0.3–1.2)
BUN: 31 mg/dL — AB (ref 6–20)
CO2: 19 mmol/L — ABNORMAL LOW (ref 22–32)
CREATININE: 0.77 mg/dL (ref 0.44–1.00)
Calcium: 9.6 mg/dL (ref 8.9–10.3)
Chloride: 105 mmol/L (ref 101–111)
GFR calc Af Amer: >60 mL/min (ref >60)
Glucose, Bld: 118 mg/dL — ABNORMAL HIGH (ref 65–99)
POTASSIUM: 3.4 mmol/L — AB (ref 3.5–5.1)
Sodium: 136 mmol/L (ref 135–145)
TOTAL PROTEIN: 7 g/dL (ref 6.5–8.1)

## 2016-06-24 LAB — LIPID PANEL
CHOLESTEROL: 227 mg/dL — AB (ref 0–200)
HDL: 27 mg/dL — ABNORMAL LOW (ref >40)
LDL Cholesterol: 126 mg/dL — ABNORMAL HIGH (ref 0–99)
Total CHOL/HDL Ratio: 8.4 RATIO
Triglycerides: 370 mg/dL — ABNORMAL HIGH (ref <150)
VLDL: 74 mg/dL — ABNORMAL HIGH (ref 0–40)

## 2016-06-24 LAB — NM MYOCAR MULTI W/SPECT W/WALL MOTION / EF
CHL CUP RESTING HR STRESS: 113 {beats}/min
CSEPEW: 1 METS
CSEPHR: 67 %
Exercise duration (min): 5 min
MPHR: 173 {beats}/min
Peak HR: 117 {beats}/min

## 2016-06-24 LAB — CBC
HEMATOCRIT: 36.9 % (ref 36.0–46.0)
Hemoglobin: 12.1 g/dL (ref 12.0–15.0)
MCH: 31.1 pg (ref 26.0–34.0)
MCHC: 32.8 g/dL (ref 30.0–36.0)
MCV: 94.9 fL (ref 78.0–100.0)
Platelets: 382 10*3/uL (ref 150–400)
RBC: 3.89 MIL/uL (ref 3.87–5.11)
RDW: 13.5 % (ref 11.5–15.5)
WBC: 13.1 10*3/uL — AB (ref 4.0–10.5)

## 2016-06-24 LAB — T4, FREE: Free T4: 0.92 ng/dL (ref 0.61–1.12)

## 2016-06-24 MED ORDER — REGADENOSON 0.4 MG/5ML IV SOLN
INTRAVENOUS | Status: AC
  Administered 2016-06-24: 0.4 mg via INTRAVENOUS
  Filled 2016-06-24: qty 5

## 2016-06-24 MED ORDER — REGADENOSON 0.4 MG/5ML IV SOLN
0.4000 mg | Freq: Once | INTRAVENOUS | Status: AC
  Administered 2016-06-24: 0.4 mg via INTRAVENOUS
  Filled 2016-06-24: qty 5

## 2016-06-24 MED ORDER — HYDROMORPHONE HCL 1 MG/ML IJ SOLN
1.0000 mg | INTRAMUSCULAR | Status: DC | PRN
  Administered 2016-06-24: 2 mg via INTRAVENOUS
  Administered 2016-06-24: 1 mg via INTRAVENOUS
  Administered 2016-06-24 – 2016-06-27 (×20): 2 mg via INTRAVENOUS
  Administered 2016-06-28: 1 mg via INTRAVENOUS
  Administered 2016-06-28 – 2016-06-29 (×4): 2 mg via INTRAVENOUS
  Administered 2016-06-29: 1 mg via INTRAVENOUS
  Administered 2016-06-29 – 2016-07-02 (×16): 2 mg via INTRAVENOUS
  Filled 2016-06-24 (×6): qty 2
  Filled 2016-06-24: qty 1
  Filled 2016-06-24 (×14): qty 2
  Filled 2016-06-24: qty 1
  Filled 2016-06-24 (×4): qty 2
  Filled 2016-06-24: qty 1
  Filled 2016-06-24 (×5): qty 2
  Filled 2016-06-24: qty 1
  Filled 2016-06-24 (×14): qty 2

## 2016-06-24 MED ORDER — HYDROMORPHONE HCL 1 MG/ML IJ SOLN
1.0000 mg | INTRAMUSCULAR | Status: DC | PRN
  Administered 2016-06-24 (×3): 1 mg via INTRAVENOUS
  Filled 2016-06-24 (×3): qty 1

## 2016-06-24 MED ORDER — HYDROMORPHONE HCL 1 MG/ML IJ SOLN
1.0000 mg | INTRAMUSCULAR | Status: AC | PRN
  Administered 2016-06-24 (×2): 1 mg via INTRAVENOUS
  Filled 2016-06-24: qty 1

## 2016-06-24 MED ORDER — HYDROMORPHONE HCL 1 MG/ML IJ SOLN
0.5000 mg | Freq: Once | INTRAMUSCULAR | Status: AC
  Administered 2016-06-24: 0.5 mg via INTRAVENOUS
  Filled 2016-06-24: qty 1

## 2016-06-24 MED ORDER — POTASSIUM CHLORIDE CRYS ER 20 MEQ PO TBCR
40.0000 meq | EXTENDED_RELEASE_TABLET | Freq: Once | ORAL | Status: AC
  Administered 2016-06-24: 40 meq via ORAL
  Filled 2016-06-24: qty 2

## 2016-06-24 MED ORDER — OXYCODONE HCL 5 MG PO TABS
5.0000 mg | ORAL_TABLET | ORAL | Status: DC | PRN
  Administered 2016-06-24 – 2016-06-30 (×12): 5 mg via ORAL
  Filled 2016-06-24 (×13): qty 1

## 2016-06-24 MED ORDER — SIMVASTATIN 20 MG PO TABS
20.0000 mg | ORAL_TABLET | Freq: Every day | ORAL | Status: DC
  Administered 2016-06-24: 20 mg via ORAL
  Filled 2016-06-24: qty 1

## 2016-06-24 MED ORDER — TECHNETIUM TC 99M TETROFOSMIN IV KIT
10.0000 | PACK | Freq: Once | INTRAVENOUS | Status: AC | PRN
  Administered 2016-06-24: 10 via INTRAVENOUS

## 2016-06-24 MED ORDER — TECHNETIUM TC 99M TETROFOSMIN IV KIT
30.0000 | PACK | Freq: Once | INTRAVENOUS | Status: AC | PRN
  Administered 2016-06-24: 30 via INTRAVENOUS

## 2016-06-24 MED ORDER — HYDROMORPHONE HCL 1 MG/ML IJ SOLN
INTRAMUSCULAR | Status: AC
  Filled 2016-06-24: qty 1

## 2016-06-24 MED ORDER — AMLODIPINE BESYLATE 5 MG PO TABS
2.5000 mg | ORAL_TABLET | Freq: Every day | ORAL | Status: DC
  Administered 2016-06-24 – 2016-06-27 (×4): 2.5 mg via ORAL
  Filled 2016-06-24 (×4): qty 1

## 2016-06-24 NOTE — CV Procedure (Signed)
2D echo was attempted, but patient having stress test

## 2016-06-24 NOTE — Progress Notes (Addendum)
VASCULAR LAB PRELIMINARY  ARTERIAL  ABI completed:ABIs indicate a severe reduction in arterial blood flow to the bilateral lower extremities with abnormal waveforms.     RIGHT    LEFT    PRESSURE WAVEFORM  PRESSURE WAVEFORM  BRACHIAL 152 T BRACHIAL    DP   DP    AT 31 0.20 AT 56 0.37  PT 68 0.45 PT 46 0.30  PER   PER    GREAT TOE  NA GREAT TOE  NA    RIGHT LEFT  ABI 0.45 0.37     Jajuan Skoog, RVT 06/24/2016, 7:47 AM

## 2016-06-24 NOTE — Progress Notes (Signed)
   VASCULAR SURGERY ASSESSMENT & PLAN:  This patient has a chronic aortic occlusion area she has extensive collaterals with reconstitution of the external iliac arteries bilaterally. Her symptoms in the right leg and loss of bladder control did not fit with symptoms from her vascular disease and she is I believe scheduled for an MRI to further work this up. However, she's had progressive ischemia of both legs and now has dry gangrene of the right great toe. I have recommended aortofemoral bypass grafting pending her cardiac evaluation. I could potentially do surgery on Friday if she is agreeable. She clearly is at increased risk for surgery because of her obesity but I think without revascularization she is at significant risk for limb loss.  SUBJECTIVE: Denies rest pain in her feet.  PHYSICAL EXAM: Vitals:   06/24/16 0927 06/24/16 0928 06/24/16 0930 06/24/16 0931  BP: 114/60 109/64 114/68   Pulse:  (!) 117 (!) 115 (!) 117  Resp:      Temp:      TempSrc:      SpO2:       Dry gangrene right great toe. Ischemic changes to toes on both feet.  LABS:  I have reviewed her noninvasive arterial studies which show an ABI of 45% on the right and 37% on the left.  Lab Results  Component Value Date   WBC 13.1 (H) 06/24/2016   HGB 12.1 06/24/2016   HCT 36.9 06/24/2016   MCV 94.9 06/24/2016   PLT 382 06/24/2016   Lab Results  Component Value Date   CREATININE 0.77 06/24/2016   Lab Results  Component Value Date   INR 1.08 06/23/2016    Principal Problem:   Aortoiliac occlusive disease (HCC) Active Problems:   Peripheral vascular disease (HCC)   Acute kidney injury (HCC)   HTN (hypertension)   Ischemic ulcer of foot (HCC)   Cardiovascular risk factor   Leukocytosis   Tobacco abuse   Chronic low back pain   ADD (attention deficit disorder)   Cari CarawayChris Treina Arscott Beeper: [phone]: [phone] 06/24/2016

## 2016-06-24 NOTE — Progress Notes (Signed)
IR called, stated pt would not lay still for stress test due to leg pain. 1mg  IV dilaudid given to pt in IR for 9/10 R leg/foot pain. Pt states no CP at this time. Pt agreeable to try stress test again. Will continue to monitor.

## 2016-06-24 NOTE — Progress Notes (Signed)
   Frances HaggardAngela Doscher presented for a Lexiscan cardiolite today.  No immediate complications.  Stress imaging is pending at this time.  Laverda PageLindsay Tramane Gorum, NP 06/24/2016, 9:33 AM

## 2016-06-24 NOTE — Progress Notes (Addendum)
Patient Name: Frances Graham Date of Encounter: 06/24/2016  Hospital Problem List     Principal Problem:   Aortoiliac occlusive disease (HCC) Active Problems:   Peripheral vascular disease (HCC)   Acute kidney injury (HCC)   HTN (hypertension)   Ischemic ulcer of foot (HCC)   Cardiovascular risk factor   Leukocytosis   Tobacco abuse   Chronic low back pain   ADD (attention deficit disorder)    Subjective   No chest pain, c/o buttocks pain from stretcher.   Inpatient Medications    . docusate sodium  100 mg Oral BID  . enoxaparin (LOVENOX) injection  40 mg Subcutaneous Q24H  . gabapentin  600 mg Oral TID  . methocarbamol  500 mg Oral TID  . nicotine  21 mg Transdermal Daily  . potassium chloride  40 mEq Oral Once  . sodium chloride flush  3 mL Intravenous Q12H    Vital Signs    Vitals:   06/23/16 1800 06/24/16 0415 06/24/16 0900 06/24/16 0926  BP: 112/70 129/81 (!) 141/87   Pulse: (!) 108 (!) 107 (!) 105 (!) 116  Resp: 18 18    Temp: 97.9 F (36.6 C) 98.5 F (36.9 C)    TempSrc: Oral Oral    SpO2: 100% 99%     No intake or output data in the 24 hours ending 06/24/16 0929 There were no vitals filed for this visit.  Physical Exam    General: Pleasant caucasian female, NAD. Neuro: Alert and oriented X 3. Moves all extremities spontaneously. Psych: Normal affect. HEENT:  Normal  Neck: Supple without bruits or JVD. Lungs:  Resp regular and unlabored, CTA. Heart: RRR no s3, s4, or murmurs. Abdomen: Soft, non-tender, non-distended, BS + x 4.  Extremities: Black ischemic zone right great toe, blue discoloration of other toes on the left to lesser degree. Significantly diminished distal pulses  Labs    CBC  Recent Labs  06/23/16 1340 06/24/16 0348  WBC 13.0* 13.1*  NEUTROABS 7.0  --   HGB 12.7 12.1  HCT 38.2 36.9  MCV 95.7 94.9  PLT 412* 382   Basic Metabolic Panel  Recent Labs  06/23/16 1340 06/24/16 0348  NA 136 136  K 4.0 3.4*  CL 102  105  CO2 16* 19*  GLUCOSE 97 118*  BUN 41* 31*  CREATININE 1.35* 0.77  CALCIUM 10.3 9.6   Liver Function Tests  Recent Labs  06/24/16 0348  AST 25  ALT 26  ALKPHOS 74  BILITOT 0.6  PROT 7.0  ALBUMIN 3.4*   No results for input(s): LIPASE, AMYLASE in the last 72 hours. Cardiac Enzymes No results for input(s): CKTOTAL, CKMB, CKMBINDEX, TROPONINI in the last 72 hours. BNP Invalid input(s): POCBNP D-Dimer No results for input(s): DDIMER in the last 72 hours. Hemoglobin A1C No results for input(s): HGBA1C in the last 72 hours. Fasting Lipid Panel  Recent Labs  06/24/16 0348  CHOL 227*  HDL 27*  LDLCALC 126*  TRIG 370*  CHOLHDL 8.4   Thyroid Function Tests  Recent Labs  06/23/16 1855  TSH 0.295*    Telemetry    ST  ECG    ST  Radiology     Assessment & Plan    Ms. Taha is a 50 y.o.female with past history outlined below, currently being admitted for further management of symptomatic PAD. She has been found to have an infrarenal aortic occlusion with reconstituted bilateral external iliac arteries and intact bilateral femoral-popliteal perfusion and three-vessel  runoff. She reports low back pain with claudication, also some neuropathic symptoms. She has had blue discoloration of her toes, black ischemic zone involving her right great toe. Evaluation by Dr. Imogene Burnhen with [facility] noted. Possible aortobifemoral bypass is being considered.  1. Preoperative evaluation a 50 year old woman with ongoing tobacco abuse, family history of CAD, hypertension, and symptomatic PAD with occluded infrarenal aorta. She is being considered for probable aortobifemoral bypass (nonemergent). ECG is nonspecific.  -- Seen in Nuc Med for Lexiscan. Imaging to follow.   2. Essential hypertension by history, on lisinopril as an outpatient.  3. Ongoing tobacco abuse.  Signed, Laverda PageLindsay Roberts NP-C Pager [phone]  Patient seen and examined. Agree with assessment and plan.  Undergoing lexiscan myoview study for pre-operative  Assessment prior to possible aortobifemoral bypass.  Significant hyperlipidemia with atherogenic profile with low HDL and high TG suggestive of increased small LDL particles.  Will need statin, omega 3 FFA and possibly fenofibrate. Smoking cessation is imperative. Await imaging results.    Lennette Biharihomas A. Hessie Varone, MD, [facility] HospitalFACC 06/24/2016 9:38 AM    Addendum: Low risk nuclear study, No ischemia. EF 56% Nicki Guadalajarahomas Iyania Denne, MD  6:57 PM

## 2016-06-24 NOTE — Progress Notes (Signed)
PROGRESS NOTE    Frances Graham  ZOX:[id number] DOB: 02-Jul-1966 DOA: 06/23/2016 PCP: No PCP Per Patient    Brief Narrative: Frances Graham is a 50 y.o. female with a Past Medical History of aanxiety, ADHD, hypertension, TMJ and peripheral arterial disease who presents with nnonhealing toe ulcer. Presents complaining of back pain, shooting pain in her legs. She also has reported that she has also developed intermittent weakness in both of her legs and episodes of incontinence. She returns to the ER today complaining of more sharp pain in the right leg that worsens with activity and with increase in the discoloration.  CT angiogram of the lower extremities which reveals a chronic infrarenal aortic occlusion with reconstituted bilateral external iliac arteries and intact bilateral femoropopliteal perfusion with three-vessel runoff on both sides.   Assessment & Plan:   Principal Problem:   Aortoiliac occlusive disease (HCC) Active Problems:   Peripheral vascular disease ([place])   Acute kidney injury ([place])   HTN (hypertension)   Ischemic ulcer of foot ([place])   Cardiovascular risk factor   Leukocytosis   Tobacco abuse   Chronic low back pain   ADD (attention deficit disorder)  Aortoiliac occlusive disease/ Peripheral vascular disease  Risk factors for PVD include: ongoing tobacco abuse and hypertension-lipid status unknown. -Evaluated by cardiology for pre op clearance . Myoview stress test today.   Acute kidney injury ; Since that time patient has been started on an ACE inhibitor with a thiazide diuretic and current renal function is 41/1.35 Hold ACE inhibitor and thiazide diuretic. On admission cr 1.5-- trending down.   Ischemic ulcer of foot/Leukocytosis -No erythema or drainage; no issues with fevers -Ulcers appear consistent with dry gangrene and are quite localized -ESR; 54 -follow WBC trend.   HLD; start statins.   Chronic low back pain -Patient reports multiple years  of chronic low back pain, shooting pain in her legs -Continue preadmission Neurontin and Robaxin -will order MRI Lumbar spine.   HTN;  -start norvasc.   Tobacco abuse -Patient reports smoking 5 cigarettes per day and has been smoking for greater than 25 years -Cessation counseling initiated -Nicotine patch   Attention deficit disorder -Continue Vyvanse     DVT prophylaxis: lovenox.  Code Status: full code.  Family Communication: care discussed with patient  Disposition Plan: remain inpatient   Consultants:   Cardiology  Vascular.    Procedures:   Stress test   Antimicrobials:   none   Subjective: Still complaining of feet pain, bilateral; has discoloration of right first toe for months.  Back pain, shooting pain started 5 days ago / still with significant pain   Objective: Vitals:   06/24/16 0928 06/24/16 0930 06/24/16 0931 06/24/16 1405  BP: 109/64 114/68  (!) 172/83  Pulse: (!) 117 (!) 115 (!) 117 (!) 111  Resp:    20  Temp:    97.4 F (36.3 C)  TempSrc:    Oral  SpO2:    100%   No intake or output data in the 24 hours ending 06/24/16 1458 There were no vitals filed for this visit.  Examination:  General exam: Appears calm and comfortable  Respiratory system: Clear to auscultation. Respiratory effort normal. Cardiovascular system: S1 & S2 heard, RRR. No JVD, murmurs, rubs, gallops or clicks. No pedal edema. Gastrointestinal system: Abdomen is nondistended, soft and nontender. No organomegaly or masses felt. Normal bowel sounds heard. Central nervous system: Alert and oriented. No focal neurological deficits. Extremities: Symmetric 5 x 5 power. Skin:  right big toe with chronic black discoloration.  Psychiatry: Judgement and insight appear normal. Mood & affect appropriate.     Data Reviewed: I have personally reviewed following labs and imaging studies  CBC:  Recent Labs Lab 06/18/16 1818 06/23/16 1340 06/24/16 0348  WBC 21.5* 13.0*  13.1*  NEUTROABS 15.6* 7.0  --   HGB 13.5 12.7 12.1  HCT 38.8 38.2 36.9  MCV 92.8 95.7 94.9  PLT 344 412* 144   Basic Metabolic Panel:  Recent Labs Lab 06/18/16 1818 06/23/16 1340 06/24/16 0348  NA 137 136 136  K 4.2 4.0 3.4*  CL 105 102 105  CO2 17* 16* 19*  GLUCOSE 121* 97 118*  BUN 26* 41* 31*  CREATININE 1.52* 1.35* 0.77  CALCIUM 9.4 10.3 9.6   GFR: Estimated Creatinine Clearance: 100.5 mL/min (by C-G formula based on SCr of 0.77 mg/dL). Liver Function Tests:  Recent Labs Lab 06/24/16 0348  AST 25  ALT 26  ALKPHOS 74  BILITOT 0.6  PROT 7.0  ALBUMIN 3.4*   No results for input(s): LIPASE, AMYLASE in the last 168 hours. No results for input(s): AMMONIA in the last 168 hours. Coagulation Profile:  Recent Labs Lab 06/18/16 1818 06/23/16 1352  INR 1.00 1.08   Cardiac Enzymes: No results for input(s): CKTOTAL, CKMB, CKMBINDEX, TROPONINI in the last 168 hours. BNP (last 3 results) No results for input(s): PROBNP in the last 8760 hours. HbA1C: No results for input(s): HGBA1C in the last 72 hours. CBG: No results for input(s): GLUCAP in the last 168 hours. Lipid Profile:  Recent Labs  06/24/16 0348  CHOL 227*  HDL 27*  LDLCALC 126*  TRIG 370*  CHOLHDL 8.4   Thyroid Function Tests:  Recent Labs  06/23/16 1855 06/24/16 1227  TSH 0.295*  --   FREET4  --  0.92   Anemia Panel: No results for input(s): VITAMINB12, FOLATE, FERRITIN, TIBC, IRON, RETICCTPCT in the last 72 hours. Sepsis Labs: No results for input(s): PROCALCITON, LATICACIDVEN in the last 168 hours.  No results found for this or any previous visit (from the past 240 hour(s)).       Radiology Studies: Dg Lumbar Spine Complete  Result Date: 06/23/2016 CLINICAL DATA:  Low back pain extending into the right leg. Right leg and foot numbness. Bladder incontinence. EXAM: LUMBAR SPINE - COMPLETE 4+ VIEW COMPARISON:  CT same date. FINDINGS: Five lumbar type vertebral bodies. The  alignment is normal. No evidence acute fracture or pars defect. There is mild intervertebral spurring throughout the lumbar spine. Contrast material is present within the ureters and bladder from preceding CT. IMPRESSION: No evidence of acute lumbar spine injury.  Mild spondylosis. Electronically Signed   By: Richardean Sale M.D.   On: 06/23/2016 16:58   Ct Angio Ao+bifem W &/or Wo Contrast  Result Date: 06/23/2016 CLINICAL DATA:  50 year old female with low back pain and numbness in the right foot and neck. EXAM: CT ANGIOGRAPHY OF ABDOMINAL AORTA WITH ILIOFEMORAL RUNOFF TECHNIQUE: Multidetector CT imaging of the abdomen, pelvis and lower extremities was performed using the standard protocol during bolus administration of intravenous contrast. Multiplanar CT image reconstructions and MIPs were obtained to evaluate the vascular anatomy. CONTRAST:  100 cc Isovue 370 COMPARISON:  Abdominal CT dated 08/08/2013 FINDINGS: VASCULAR Aorta: There is intraluminal clot with high grade stenosis of the abdominal aorta starting just below the level of the renal arteries and extending distally with involvement of the infrarenal abdominal aorta and common iliac arteries. There is complete  occlusion of the distal aorta starting at the level of the IMA and extending to the common iliac arteries bilaterally. There is reconstitution of flow in the distal aspect of the common iliac arteries. There is patency of the external and internal iliac arteries bilaterally. The origins of the celiac axis, SMA appear patent. There is narrowing of the origin of the IMA. This vessel however remains patent. There is a classic celiac axis branching anatomy. The renal arteries are patent. 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 RIGHT Lower Extremity The common femoral artery, profundus femoris, superficial femoral artery, popliteal artery, posterior tibial arteries, and the peroneal artery appear patent. The plantar artery appears patent to  the level of the mid metatarsal. The plantar digital arteries are not visualized. The anterior tibial artery appears patent along the calf. There is diminished flow in the distal aspect of the anterior tibial artery with absence of flow approximately 4.4 cm above the ankle and distal to this level. No flow identified in the dorsalis pedis artery. Correlation with clinical exam and duplex ultrasound recommended. LEFT Lower Extremity The common femoral artery, deep and superficial femoral artery is, popliteal artery, posterior tibial artery, plantar artery, fibular artery appear patent. The anterior tibial artery is patent to the level of distal calf. There is diminished flow in the anterior tibial artery distally spurring approximately 10 cm above the ankle joint. There is loss of flow in the distal ATA starting approximately 6 cm above the ankle. No flow identified in the dorsalis pedis artery. Correlation with clinical exam and duplex ultrasound of the foot recommended. Review of the MIP images confirms the above findings. NON-VASCULAR Lower chest: The visualized lung bases are clear. No intra-abdominal free air or free fluid. Hepatobiliary: No focal liver abnormality is seen. No gallstones, gallbladder wall thickening, or biliary dilatation. Pancreas: Unremarkable. No pancreatic ductal dilatation or surrounding inflammatory changes. Spleen: Normal in size without focal abnormality. Adrenals/Urinary Tract: Adrenal glands are unremarkable. Kidneys are normal, without renal calculi, focal lesion, or hydronephrosis. Bladder is unremarkable. Stomach/Bowel: There is mild thickened appearance of the distal colon, likely related to underdistention. Colitis is less likely. There is no pericolonic stranding. No pneumatosis. There is no evidence of bowel obstruction. Normal appendix. Vascular/Lymphatic: The IVC appears unremarkable. No portal venous gas identified. There is no adenopathy. Reproductive: The uterus and the  ovaries are grossly unremarkable. Other: None Musculoskeletal: Mild degenerative changes. T11 compression deformity with anterior wedging, similar to prior CT. No acute fracture. IMPRESSION: VASCULAR High grade stenosis of the abdominal aorta starting just below the level of the renal arteries with complete occlusion of the aorta starting at the level of the IMA and extending to the common iliac arteries bilaterally. There is reconstitution of the flow in the distal aspect of the common iliac arteries. The origins of the celiac axis, SMA, IMA as well as the origins of the renal arteries remain patent. Diminished flow in the distal anterior tibial arteries with no detectable flow in the dorsalis pedis arteries bilaterally. Correlation with clinical exam and further evaluation with duplex ultrasound and vascular surgery consult recommended. NON-VASCULAR No acute intra-abdominal or pelvic pathology. These results were called by telephone at the time of interpretation on 06/23/2016 at 7:11 pm to nurse [facility], who verbally acknowledged these results. Electronically Signed   By: Anner Crete M.D.   On: 06/23/2016 19:26   Nm Myocar Multi W/spect W/wall Motion / Ef  Result Date: 06/24/2016 CLINICAL DATA:  Preoperative clearance needed.  Aortic  occlusion. EXAM: MYOCARDIAL IMAGING WITH SPECT (REST AND PHARMACOLOGIC-STRESS) GATED LEFT VENTRICULAR WALL MOTION STUDY LEFT VENTRICULAR EJECTION FRACTION TECHNIQUE: Standard myocardial SPECT imaging was performed after resting intravenous injection of 10 mCi Tc-44mtetrofosmin. Subsequently, intravenous infusion of Lexiscan was performed under the supervision of the Cardiology staff. At peak effect of the drug, 30 mCi Tc-971metrofosmin was injected intravenously and standard myocardial SPECT imaging was performed. Quantitative gated imaging was also performed to evaluate left ventricular wall motion, and estimate left ventricular ejection fraction. COMPARISON:  None.  FINDINGS: Perfusion: There is gut uptake on both the rest and stress images. The gut uptake causes limited evaluation of the septal and inferoseptal wall. No evidence to suggest reversibility. No clear evidence for an infarct. Wall Motion: Abnormal wall motion involving the septal and inferoseptal wall. Concern for paradoxical motion along the anteroseptal wall and akinesia in the inferoseptal wall. Normal wall motion in the anterior and lateral walls. Left Ventricular Ejection Fraction: 56 % End diastolic volume 81 ml End systolic volume 36 ml IMPRESSION: 1. No reversible ischemia. However, the study has technical limitations due to a large amount of gut uptake and limited evaluation of the septal and inferoseptal walls as described. 2. Abnormal wall motion involving the septal and inferoseptal wall as described. This abnormal wall motion may be associated with the gut uptake. Wall motion in these areas are equivocal. 3. Left ventricular ejection fraction is 56%. 4. Non invasive risk stratification*: Low *2012 Appropriate Use Criteria for Coronary Revascularization Focused Update: J Am Coll Cardiol. 206484;72(0):[email or web address] Electronically Signed   By: AdMarkus Daft.D.   On: 06/24/2016 13:52        Scheduled Meds: . docusate sodium  100 mg Oral BID  . enoxaparin (LOVENOX) injection  40 mg Subcutaneous Q24H  . gabapentin  600 mg Oral TID  . methocarbamol  500 mg Oral TID  . nicotine  21 mg Transdermal Daily  . sodium chloride flush  3 mL Intravenous Q12H   Continuous Infusions: . sodium chloride 75 mL/hr at 06/24/16 1146     LOS: 1 day    Time spent: 35 minutes.     ReElmarie ShileyMD [facility] Pager [phone] 7PM-7AM, please contact night-coverage [email or web address] Password [email or web address] 06/24/2016, 2:58 PM

## 2016-06-25 ENCOUNTER — Inpatient Hospital Stay (HOSPITAL_COMMUNITY): Payer: Medicaid Other

## 2016-06-25 ENCOUNTER — Telehealth: Payer: Self-pay | Admitting: Vascular Surgery

## 2016-06-25 DIAGNOSIS — I739 Peripheral vascular disease, unspecified: Secondary | ICD-10-CM

## 2016-06-25 DIAGNOSIS — Z01810 Encounter for preprocedural cardiovascular examination: Secondary | ICD-10-CM

## 2016-06-25 LAB — CBC
HEMATOCRIT: 35.2 % — AB (ref 36.0–46.0)
HEMOGLOBIN: 11.5 g/dL — AB (ref 12.0–15.0)
MCH: 31.5 pg (ref 26.0–34.0)
MCHC: 32.7 g/dL (ref 30.0–36.0)
MCV: 96.4 fL (ref 78.0–100.0)
Platelets: 412 10*3/uL — ABNORMAL HIGH (ref 150–400)
RBC: 3.65 MIL/uL — ABNORMAL LOW (ref 3.87–5.11)
RDW: 13.4 % (ref 11.5–15.5)
WBC: 10.1 10*3/uL (ref 4.0–10.5)

## 2016-06-25 LAB — HEMOGLOBIN A1C
HEMOGLOBIN A1C: 5.8 % — AB (ref 4.8–5.6)
MEAN PLASMA GLUCOSE: 120 mg/dL

## 2016-06-25 LAB — BASIC METABOLIC PANEL
ANION GAP: 11 (ref 5–15)
BUN: 19 mg/dL (ref 6–20)
CHLORIDE: 103 mmol/L (ref 101–111)
CO2: 23 mmol/L (ref 22–32)
Calcium: 9.2 mg/dL (ref 8.9–10.3)
Creatinine, Ser: 0.64 mg/dL (ref 0.44–1.00)
GFR calc non Af Amer: >60 mL/min (ref >60)
Glucose, Bld: 115 mg/dL — ABNORMAL HIGH (ref 65–99)
Potassium: 3.4 mmol/L — ABNORMAL LOW (ref 3.5–5.1)
Sodium: 137 mmol/L (ref 135–145)

## 2016-06-25 LAB — T3, FREE: T3 FREE: 3.4 pg/mL (ref 2.0–4.4)

## 2016-06-25 LAB — ECHOCARDIOGRAM COMPLETE

## 2016-06-25 MED ORDER — NICOTINE 7 MG/24HR TD PT24
7.0000 mg | MEDICATED_PATCH | Freq: Every day | TRANSDERMAL | Status: DC
  Administered 2016-06-26 – 2016-07-03 (×7): 7 mg via TRANSDERMAL
  Filled 2016-06-25 (×7): qty 1

## 2016-06-25 MED ORDER — ATORVASTATIN CALCIUM 40 MG PO TABS
40.0000 mg | ORAL_TABLET | Freq: Every day | ORAL | Status: DC
  Administered 2016-06-25 – 2016-06-27 (×3): 40 mg via ORAL
  Filled 2016-06-25 (×3): qty 1

## 2016-06-25 MED ORDER — LORAZEPAM 1 MG PO TABS
1.0000 mg | ORAL_TABLET | Freq: Once | ORAL | Status: AC
  Administered 2016-06-25: 1 mg via ORAL
  Filled 2016-06-25: qty 1

## 2016-06-25 MED ORDER — POTASSIUM CHLORIDE CRYS ER 20 MEQ PO TBCR
40.0000 meq | EXTENDED_RELEASE_TABLET | Freq: Once | ORAL | Status: AC
  Administered 2016-06-25: 40 meq via ORAL

## 2016-06-25 MED ORDER — FENOFIBRATE 160 MG PO TABS
160.0000 mg | ORAL_TABLET | Freq: Every day | ORAL | Status: DC
  Administered 2016-06-25 – 2016-06-27 (×3): 160 mg via ORAL
  Filled 2016-06-25 (×2): qty 1

## 2016-06-25 MED ORDER — METOPROLOL TARTRATE 25 MG PO TABS
25.0000 mg | ORAL_TABLET | Freq: Two times a day (BID) | ORAL | Status: DC
  Administered 2016-06-25 – 2016-06-29 (×8): 25 mg via ORAL
  Filled 2016-06-25 (×8): qty 1

## 2016-06-25 NOTE — Progress Notes (Signed)
Patient Name: Frances Graham Date of Encounter: 06/25/2016  Primary Cardiologist: New (Dr. Diona BrownerMcDowell)  [facility]     Principal Problem:   Aortoiliac occlusive disease (HCC) Active Problems:   Peripheral vascular disease (HCC)   Acute kidney injury (HCC)   HTN (hypertension)   Ischemic ulcer of foot (HCC)   Cardiovascular risk factor   Leukocytosis   Tobacco abuse   Chronic low back pain   ADD (attention deficit disorder)     Subjective   No chest pain or sob. Poor insight of health condition.   Inpatient Medications    Scheduled Meds: . amLODipine  2.5 mg Oral Daily  . docusate sodium  100 mg Oral BID  . enoxaparin (LOVENOX) injection  40 mg Subcutaneous Q24H  . gabapentin  600 mg Oral TID  . methocarbamol  500 mg Oral TID  . nicotine  21 mg Transdermal Daily  . simvastatin  20 mg Oral q1800  . sodium chloride flush  3 mL Intravenous Q12H   Continuous Infusions: . sodium chloride 75 mL/hr at 06/25/16 0215   PRN Meds:.acetaminophen **OR** acetaminophen, HYDROmorphone (DILAUDID) injection, lisdexamfetamine, ondansetron **OR** ondansetron (ZOFRAN) IV, oxyCODONE, traMADol   Vital Signs    Vitals:   06/24/16 1405 06/24/16 1955 06/25/16 0543 06/25/16 1001  BP: (!) 172/83 (!) 146/63 (!) 161/82 129/75  Pulse: (!) 111 (!) 114 (!) 110   Resp: 20 18 20    Temp: 97.4 F (36.3 C) 98.8 F (37.1 C) 98.6 F (37 C)   TempSrc: Oral Oral Oral   SpO2: 100% 100% 98%     Intake/Output Summary (Last 24 hours) at 06/25/16 1227 Last data filed at 06/25/16 0900  Gross per 24 hour  Intake              240 ml  Output                0 ml  Net              240 ml   There were no vitals filed for this visit.  Physical Exam   GEN: Well nourished, well developed, in no acute distress.  HEENT: Grossly normal.  Neck: Supple, no JVD, carotid bruits, or masses. Cardiac: RRR, no murmurs, rubs, or gallops.  Extremities: Black ischemic zone right great toe, blue  discoloration of other toes on the left to lesser degree. Significantly diminished distal pulses Respiratory:  Respirations regular and unlabored, clear to auscultation bilaterally. GI: Soft, nontender, nondistended, BS + x 4. Neuro:  Strength and sensation are intact. Psych: AAOx3.  Normal affect.  Labs    CBC  Recent Labs  06/23/16 1340 06/24/16 0348 06/25/16 0215  WBC 13.0* 13.1* 10.1  NEUTROABS 7.0  --   --   HGB 12.7 12.1 11.5*  HCT 38.2 36.9 35.2*  MCV 95.7 94.9 96.4  PLT 412* 382 412*   Basic Metabolic Panel  Recent Labs  06/24/16 0348 06/25/16 0215  NA 136 137  K 3.4* 3.4*  CL 105 103  CO2 19* 23  GLUCOSE 118* 115*  BUN 31* 19  CREATININE 0.77 0.64  CALCIUM 9.6 9.2   Liver Function Tests  Recent Labs  06/24/16 0348  AST 25  ALT 26  ALKPHOS 74  BILITOT 0.6  PROT 7.0  ALBUMIN 3.4*   No results for input(s): LIPASE, AMYLASE in the last 72 hours. Cardiac Enzymes No results for input(s): CKTOTAL, CKMB, CKMBINDEX, TROPONINI in the last 72 hours. BNP Invalid input(s):  POCBNP D-Dimer No results for input(s): DDIMER in the last 72 hours. Hemoglobin A1C  Recent Labs  06/23/16 1855  HGBA1C 5.8*   Fasting Lipid Panel  Recent Labs  06/24/16 0348  CHOL 227*  HDL 27*  LDLCALC 126*  TRIG 370*  CHOLHDL 8.4   Thyroid Function Tests  Recent Labs  06/23/16 1855 06/24/16 1227  TSH 0.295*  --   T3FREE  --  3.4    Telemetry    Sinus tachycardia at rate of 110-120s  ECG    N/A  Radiology    Dg Lumbar Spine Complete  Result Date: 06/23/2016 CLINICAL DATA:  Low back pain extending into the right leg. Right leg and foot numbness. Bladder incontinence. EXAM: LUMBAR SPINE - COMPLETE 4+ VIEW COMPARISON:  CT same date. FINDINGS: Five lumbar type vertebral bodies. The alignment is normal. No evidence acute fracture or pars defect. There is mild intervertebral spurring throughout the lumbar spine. Contrast material is present within the ureters  and bladder from preceding CT. IMPRESSION: No evidence of acute lumbar spine injury.  Mild spondylosis. Electronically Signed   By: Carey Bullocks M.D.   On: 06/23/2016 16:58   Mr Lumbar Spine Wo Contrast  Result Date: 06/24/2016 CLINICAL DATA:  Low back pain and bilateral groin pain. Necrotic RIGHT great toe. Recent diagnosis of infrarenal aortic occlusion. EXAM: MRI LUMBAR SPINE WITHOUT CONTRAST TECHNIQUE: Multiplanar, multisequence MR imaging of the lumbar spine was performed. No intravenous contrast was administered. COMPARISON:  CT angiogram runoff June 23, 2016 FINDINGS: Multiple sequences are moderately motion degraded. SEGMENTATION: For the purposes of this report, the last well-formed intervertebral disc will be described as L5-S1. ALIGNMENT: No malalignment.  Maintenance of the lumbar lordosis. VERTEBRAE:Lumbar vertebral bodies are intact. Mild L3-4 and moderate L5-S1 disc height loss, with decreased T2 signal within the L3-4 and L5-S1 disc compatible with desiccation. Multilevel mild subacute on chronic discogenic endplate changes. Moderate acute to subacute discogenic endplate changes T11-12. No suspicious bone marrow signal. CONUS MEDULLARIS: Conus medullaris terminates at L1-2 and demonstrates normal morphology and signal characteristics. Limited assessment of cauda equina due to moderately motion degraded axial sequences. PARASPINAL AND SOFT TISSUES: Included prevertebral and paraspinal soft tissues are normal. Due to motion, axial sequences are do not reliably coregister to the sagittal sequences and, are nondiagnostic. At L5-S1 is a small broad-based disc bulge. Mild canal stenosis suspected at L3-4 and L4-5. Mild to moderate L5-S1 neural foraminal narrowing. Moderate lower lumbar facet arthropathy better seen on yesterday's CT. IMPRESSION: Motion degraded examination, nondiagnostic axial sequences. No acute fracture or malalignment. Mild canal stenosis suspected at L3-4 and L4-5. Mild to  moderate L5-S1 neural foraminal narrowing. Electronically Signed   By: Awilda Metro M.D.   On: 06/24/2016 23:00   Ct Angio Ao+bifem W &/or Wo Contrast  Result Date: 06/23/2016 CLINICAL DATA:  50 year old female with low back pain and numbness in the right foot and neck. EXAM: CT ANGIOGRAPHY OF ABDOMINAL AORTA WITH ILIOFEMORAL RUNOFF TECHNIQUE: Multidetector CT imaging of the abdomen, pelvis and lower extremities was performed using the standard protocol during bolus administration of intravenous contrast. Multiplanar CT image reconstructions and MIPs were obtained to evaluate the vascular anatomy. CONTRAST:  100 cc Isovue 370 COMPARISON:  Abdominal CT dated 08/08/2013 FINDINGS: VASCULAR Aorta: There is intraluminal clot with high grade stenosis of the abdominal aorta starting just below the level of the renal arteries and extending distally with involvement of the infrarenal abdominal aorta and common iliac arteries. There is complete occlusion  of the distal aorta starting at the level of the IMA and extending to the common iliac arteries bilaterally. There is reconstitution of flow in the distal aspect of the common iliac arteries. There is patency of the external and internal iliac arteries bilaterally. The origins of the celiac axis, SMA appear patent. There is narrowing of the origin of the IMA. This vessel however remains patent. There is a classic celiac axis branching anatomy. The renal arteries are patent. 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 RIGHT Lower Extremity The common femoral artery, profundus femoris, superficial femoral artery, popliteal artery, posterior tibial arteries, and the peroneal artery appear patent. The plantar artery appears patent to the level of the mid metatarsal. The plantar digital arteries are not visualized. The anterior tibial artery appears patent along the calf. There is diminished flow in the distal aspect of the anterior tibial artery with absence of flow  approximately 4.4 cm above the ankle and distal to this level. No flow identified in the dorsalis pedis artery. Correlation with clinical exam and duplex ultrasound recommended. LEFT Lower Extremity The common femoral artery, deep and superficial femoral artery is, popliteal artery, posterior tibial artery, plantar artery, fibular artery appear patent. The anterior tibial artery is patent to the level of distal calf. There is diminished flow in the anterior tibial artery distally spurring approximately 10 cm above the ankle joint. There is loss of flow in the distal ATA starting approximately 6 cm above the ankle. No flow identified in the dorsalis pedis artery. Correlation with clinical exam and duplex ultrasound of the foot recommended. Review of the MIP images confirms the above findings. NON-VASCULAR Lower chest: The visualized lung bases are clear. No intra-abdominal free air or free fluid. Hepatobiliary: No focal liver abnormality is seen. No gallstones, gallbladder wall thickening, or biliary dilatation. Pancreas: Unremarkable. No pancreatic ductal dilatation or surrounding inflammatory changes. Spleen: Normal in size without focal abnormality. Adrenals/Urinary Tract: Adrenal glands are unremarkable. Kidneys are normal, without renal calculi, focal lesion, or hydronephrosis. Bladder is unremarkable. Stomach/Bowel: There is mild thickened appearance of the distal colon, likely related to underdistention. Colitis is less likely. There is no pericolonic stranding. No pneumatosis. There is no evidence of bowel obstruction. Normal appendix. Vascular/Lymphatic: The IVC appears unremarkable. No portal venous gas identified. There is no adenopathy. Reproductive: The uterus and the ovaries are grossly unremarkable. Other: None Musculoskeletal: Mild degenerative changes. T11 compression deformity with anterior wedging, similar to prior CT. No acute fracture. IMPRESSION: VASCULAR High grade stenosis of the abdominal  aorta starting just below the level of the renal arteries with complete occlusion of the aorta starting at the level of the IMA and extending to the common iliac arteries bilaterally. There is reconstitution of the flow in the distal aspect of the common iliac arteries. The origins of the celiac axis, SMA, IMA as well as the origins of the renal arteries remain patent. Diminished flow in the distal anterior tibial arteries with no detectable flow in the dorsalis pedis arteries bilaterally. Correlation with clinical exam and further evaluation with duplex ultrasound and vascular surgery consult recommended. NON-VASCULAR No acute intra-abdominal or pelvic pathology. These results were called by telephone at the time of interpretation on 06/23/2016 at 7:11 pm to nurse [facility], who verbally acknowledged these results. Electronically Signed   By: Elgie Collard M.D.   On: 06/23/2016 19:26   Nm Myocar Multi W/spect W/wall Motion / Ef  Result Date: 06/24/2016 CLINICAL DATA:  Preoperative clearance needed.  Aortic occlusion.  EXAM: MYOCARDIAL IMAGING WITH SPECT (REST AND PHARMACOLOGIC-STRESS) GATED LEFT VENTRICULAR WALL MOTION STUDY LEFT VENTRICULAR EJECTION FRACTION TECHNIQUE: Standard myocardial SPECT imaging was performed after resting intravenous injection of 10 mCi Tc-31m tetrofosmin. Subsequently, intravenous infusion of Lexiscan was performed under the supervision of the Cardiology staff. At peak effect of the drug, 30 mCi Tc-33m tetrofosmin was injected intravenously and standard myocardial SPECT imaging was performed. Quantitative gated imaging was also performed to evaluate left ventricular wall motion, and estimate left ventricular ejection fraction. COMPARISON:  None. FINDINGS: Perfusion: There is gut uptake on both the rest and stress images. The gut uptake causes limited evaluation of the septal and inferoseptal wall. No evidence to suggest reversibility. No clear evidence for an infarct. Wall Motion:  Abnormal wall motion involving the septal and inferoseptal wall. Concern for paradoxical motion along the anteroseptal wall and akinesia in the inferoseptal wall. Normal wall motion in the anterior and lateral walls. Left Ventricular Ejection Fraction: 56 % End diastolic volume 81 ml End systolic volume 36 ml IMPRESSION: 1. No reversible ischemia. However, the study has technical limitations due to a large amount of gut uptake and limited evaluation of the septal and inferoseptal walls as described. 2. Abnormal wall motion involving the septal and inferoseptal wall as described. This abnormal wall motion may be associated with the gut uptake. Wall motion in these areas are equivocal. 3. Left ventricular ejection fraction is 56%. 4. Non invasive risk stratification*: Low *2012 Appropriate Use Criteria for Coronary Revascularization Focused Update: J Am Coll Cardiol. 2012;59(9):857-881. http://content.dementiazones.com.aspx?articleid=[email or web address] Electronically Signed   By: Richarda Overlie M.D.   On: 06/24/2016 13:52     Cardiac Studies   Stress test  IMPRESSION: 1. No reversible ischemia. However, the study has technical limitations due to a large amount of gut uptake and limited evaluation of the septal and inferoseptal walls as described.  2. Abnormal wall motion involving the septal and inferoseptal wall as described. This abnormal wall motion may be associated with the gut uptake. Wall motion in these areas are equivocal.  3. Left ventricular ejection fraction is 56%.  4. Non invasive risk stratification*: Low  Patient Profile     Frances Graham a 50 y.o.femalewith past history outlined below, currently being admitted for further management of symptomatic PAD. She has been found to have an infrarenal aortic occlusion with reconstituted bilateral external iliac arteries and intact bilateral femoral-popliteal perfusion and three-vessel runoff. She reports low back pain with claudication, also  some neuropathic symptoms. She has had blue discoloration of her toes, black ischemic zone involving her right great toe. Evaluation by Dr. Imogene Burn with [facility] noted. Possible aortobifemoral bypass is being considered.  Assessment & Plan    1. Preoperative evaluation a 50 year old woman with ongoing tobacco abuse, family history of CAD, hypertension, and symptomatic PAD with occluded infrarenal aorta. She is being considered for probable aortobifemoral bypass on Friday.  ECG is nonspecific.  -- Myoview low risk without reversible ischemia. Abnormal WM --> likely due to gut uptake. Pending echo for further evaluation.  - 06/24/2016: Cholesterol 227; HDL 27; LDL Cholesterol 126; Triglycerides 370; VLDL 74  - Statin added to regimen.   2. Essential hypertension by history - BP elevated at time. On Norvasc 2.5mg .   3. Ongoing tobacco abuse - Advised smoking cessation.  4. Sinus tachycardia - Rate of 110-120s. TSH was low, however normal free T3 & T4. Will add low dose BB.   5. Aortic occlusion with B LE ischemia - Plan for ABF by Dr.  [facility] Friday Signed, [place], [place]  06/25/2016, 12:27 PM   Patient seen and examined. Agree with assessment and plan. No chest pain. Will change statin to atorvastatin since maximum recommended dose of simvastatin with concomitant amlodipine is 20md, and add fenofibrate 160 mg with significant TG and VLDL increase. Agree with BB with resting tachycardia.    Lennette Bihari, MD, [facility] 06/25/2016 1:34 PM

## 2016-06-25 NOTE — Progress Notes (Addendum)
[facility]  VASCULAR SURGERY ASSESSMENT AND PLAN:  If cleared by cardiology, I plan aortofemoral bypass grafting on Friday. 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 She is at increased risk for infection because of her obesity. I have discussed the indications for surgery and the potential complications, including, not limited to, bleeding, wound healing problems, wound infection, graft infection, MI, renal insufficiency, limb loss, or other undetectable medical problems. Her risk of mortality or major morbidity is 5-10%.  Frances Ferrarihristopher Dickson, MD, FACS [address] [phone] Office: [phone]  Subjective  - She is very worried about her condition and doesn't understand everything going on.  She has a lot of pain in the left foot/great toe.  She is asking for pain medication, then she wants to know if she can go home prior to the surgery.  Not sure if she really understands the seriousness of her situation.    Objective (!) 161/82 (!) 110 98.6 F (37 C) (Oral) 20 98%  Intake/Output Summary (Last 24 hours) at 06/25/16 0944 Last data filed at 06/25/16 0900  Gross per 24 hour  Intake              240 ml  Output                0 ml  Net              240 ml   NM stress test 06/24/2016  IMPRESSION: 1. No reversible ischemia. However, the study has technical limitations due to a large amount of gut uptake and limited evaluation of the septal and inferoseptal walls as described.  2. Abnormal wall motion involving the septal and inferoseptal wall as described. This abnormal wall motion may be associated with the gut uptake. Wall motion in these areas are equivocal.  3. Left ventricular ejection fraction is 56%.  4. 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 Bobetta Limeickson  COLLINS, [facility] 06/25/2016 9:44 AM --  Laboratory Lab Results:  Recent Labs  06/24/16 0348 06/25/16 0215  WBC 13.1* 10.1  HGB 12.1 11.5*  HCT 36.9 35.2*  PLT 382 412*   BMET  Recent Labs  06/24/16 0348 06/25/16 0215  NA 136 137  K 3.4* 3.4*  CL 105 103  CO2 19* 23  GLUCOSE 118* 115*  BUN 31* 19  CREATININE 0.77 0.64  CALCIUM 9.6 9.2

## 2016-06-25 NOTE — Telephone Encounter (Signed)
-----   Message from Sharee PimpleMarilyn K McChesney, RN sent at 06/19/2016  9:25 AM EDT ----- Regarding: f/u  Get renal appt first and then schedule CTA / CSD after ----- Message ----- From: Chuck Hinthristopher S Dickson, MD Sent: 06/18/2016   8:18 PM To: [facility] Charge Pool Subject: charge and f/u                                 [facility] Needs apt with Renal GFR = 40 and needs CT angio of aorta with runoff after seen be renal then apt with me.  CD

## 2016-06-25 NOTE — Telephone Encounter (Signed)
Per Judeth CornfieldStephanie, patient is inpatient @ [facility]. Will assess her while she is there. Referral to [facility] on hold.

## 2016-06-25 NOTE — Progress Notes (Addendum)
PROGRESS NOTE    Frances Graham  QWQ:[id number] DOB: [birth year] DOA: 06/23/2016 PCP: No PCP Per Patient    Brief Narrative: Frances Graham is a 50 y.o. female with a Past Medical History of aanxiety, ADHD, hypertension, TMJ and peripheral arterial disease who presents with nnonhealing toe ulcer. Presents complaining of back pain, shooting pain in her legs. She also has reported that she has also developed intermittent weakness in both of her legs and episodes of incontinence. She returns to the ER today complaining of more sharp pain in the right leg that worsens with activity and with increase in the discoloration.  CT angiogram of the lower extremities which reveals a chronic infrarenal aortic occlusion with reconstituted bilateral external iliac arteries and intact bilateral femoropopliteal perfusion with three-vessel runoff on both sides.   Assessment & Plan:   Principal Problem:   Aortoiliac occlusive disease (HCC) Active Problems:   Peripheral vascular disease ([place])   Acute kidney injury ([place])   HTN (hypertension)   Ischemic ulcer of foot ([place])   Cardiovascular risk factor   Leukocytosis   Tobacco abuse   Chronic low back pain   ADD (attention deficit disorder)  Aortoiliac occlusive disease/ Peripheral vascular disease  Risk factors for PVD include: ongoing tobacco abuse and hypertension-lipid status unknown. -Evaluated by cardiology for pre op clearance . Myoview stress test low risk, wall motion abnormalities.  -plan to do ECHO.  -tachycardia start low does BB.  -for possible surgery on Friday/   Acute kidney injury ; Since that time patient has been started on an ACE inhibitor with a thiazide diuretic and current renal function is 41/1.35 Hold ACE inhibitor and thiazide diuretic. On admission cr 1.5-- trending down.  Resolved, cr at 0.64.   Ischemic ulcer of foot/Leukocytosis -No erythema or drainage; no issues with fevers -Ulcers appear consistent with dry  gangrene and are quite localized -ESR; 54 -follow WBC trend.   HLD; started statins.   Chronic low back pain -Patient reports multiple years of chronic low back pain, shooting pain in her legs -Continue preadmission Neurontin and Robaxin -MRI with Mild canal stenosis suspected at L3-4 and L4-5. Mild to moderate L5-S1 neural foraminal narrowing. -Discussed MRI finding with neurosurgery Dr Cyndy Freeze, finding not impressive. Mild degeneration disk diseases. No surgical intervention needed for these finding. [name] likely would explain patient legs pain.   HTN;  -started low dose  norvasc.   Tobacco abuse -Patient reports smoking 5 cigarettes per day and has been smoking for greater than 25 years -Cessation counseling initiated -Nicotine patch   Attention deficit disorder -Continue Vyvanse   Low TSH, normal Free T 3 and Free T 4; needs repeat labs in 4 weeks.     DVT prophylaxis: lovenox.  Code Status: full code.  Family Communication: care discussed with patient  Disposition Plan: remain inpatient   Consultants:   Cardiology  Vascular.    Procedures:   Stress test   Antimicrobials:   none   Subjective: Complaining of legs pain.  She relates back pain, radiates to either leg, more frequently to tight leg, shooting pain, tingling pain.   Objective: Vitals:   06/24/16 1405 06/24/16 1955 06/25/16 0543 06/25/16 1001  BP: (!) 172/83 (!) 146/63 (!) 161/82 129/75  Pulse: (!) 111 (!) 114 (!) 110   Resp: _0 Temp: 97.4 F (36.3 C) 98.8 F (37.1 C) 98.6 F (37 C)   TempSrc: Oral Oral Oral   SpO2: 100% 100% 98%  Intake/Output Summary (Last 24 hours) at 06/25/16 1306 Last data filed at 06/25/16 1257  Gross per 24 hour  Intake              462 ml  Output                0 ml  Net              462 ml   There were no vitals filed for this visit.  Examination:  General exam: Appears calm and comfortable  Respiratory system: Clear to auscultation.  Respiratory effort normal. Cardiovascular system: S1 & S2 heard, RRR. No JVD, murmurs, rubs, gallops or clicks. No pedal edema. Gastrointestinal system: Abdomen is nondistended, soft and nontender. No organomegaly or masses felt. Normal bowel sounds heard. Central nervous system: Alert and oriented. No focal neurological deficits. Extremities: Symmetric 5 x 5 power. Skin: right big toe with chronic black discoloration.  Psychiatry: anxious.     Data Reviewed: I have personally reviewed following labs and imaging studies  CBC:  Recent Labs Lab 06/18/16 1818 06/23/16 1340 06/24/16 0348 06/25/16 0215  WBC 21.5* 13.0* 13.1* 10.1  NEUTROABS 15.6* 7.0  --   --   HGB 13.5 12.7 12.1 11.5*  HCT 38.8 38.2 36.9 35.2*  MCV 92.8 95.7 94.9 96.4  PLT 344 412* 382 159*   Basic Metabolic Panel:  Recent Labs Lab 06/18/16 1818 06/23/16 1340 06/24/16 0348 06/25/16 0215  NA 137 136 136 137  K 4.2 4.0 3.4* 3.4*  CL 105 102 105 103  CO2 17* 16* 19* 23  GLUCOSE 121* 97 118* 115*  BUN 26* 41* 31* 19  CREATININE 1.52* 1.35* 0.77 0.64  CALCIUM 9.4 10.3 9.6 9.2   GFR: Estimated Creatinine Clearance: 100.5 mL/min (by C-G formula based on SCr of 0.64 mg/dL). Liver Function Tests:  Recent Labs Lab 06/24/16 0348  AST 25  ALT 26  ALKPHOS 74  BILITOT 0.6  PROT 7.0  ALBUMIN 3.4*   No results for input(s): LIPASE, AMYLASE in the last 168 hours. No results for input(s): AMMONIA in the last 168 hours. Coagulation Profile:  Recent Labs Lab 06/18/16 1818 06/23/16 1352  INR 1.00 1.08   Cardiac Enzymes: No results for input(s): CKTOTAL, CKMB, CKMBINDEX, TROPONINI in the last 168 hours. BNP (last 3 results) No results for input(s): PROBNP in the last 8760 hours. HbA1C:  Recent Labs  06/23/16 1855  HGBA1C 5.8*   CBG: No results for input(s): GLUCAP in the last 168 hours. Lipid Profile:  Recent Labs  06/24/16 0348  CHOL 227*  HDL 27*  LDLCALC 126*  TRIG 370*  CHOLHDL 8.4     Thyroid Function Tests:  Recent Labs  06/23/16 1855 06/24/16 1227  TSH 0.295*  --   FREET4  --  0.92  T3FREE  --  3.4   Anemia Panel: No results for input(s): VITAMINB12, FOLATE, FERRITIN, TIBC, IRON, RETICCTPCT in the last 72 hours. Sepsis Labs: No results for input(s): PROCALCITON, LATICACIDVEN in the last 168 hours.  Recent Results (from the past 240 hour(s))  Culture, blood (Routine X 2) w Reflex to ID Panel     Status: None (Preliminary result)   Collection Time: 06/23/16  6:56 PM  Result Value Ref Range Status   Specimen Description BLOOD RIGHT ANTECUBITAL  Final   Special Requests BOTTLES DRAWN AEROBIC AND ANAEROBIC 6CC  Final   Culture NO GROWTH < 24 HOURS  Final   Report Status PENDING  Incomplete  Culture,  blood (Routine X 2) w Reflex to ID Panel     Status: None (Preliminary result)   Collection Time: 06/23/16  7:00 PM  Result Value Ref Range Status   Specimen Description BLOOD LEFT HAND  Final   Special Requests BOTTLES DRAWN AEROBIC AND ANAEROBIC 5CC  Final   Culture NO GROWTH < 24 HOURS  Final   Report Status PENDING  Incomplete         Radiology Studies: Dg Lumbar Spine Complete  Result Date: 06/23/2016 CLINICAL DATA:  Low back pain extending into the right leg. Right leg and foot numbness. Bladder incontinence. EXAM: LUMBAR SPINE - COMPLETE 4+ VIEW COMPARISON:  CT same date. FINDINGS: Five lumbar type vertebral bodies. The alignment is normal. No evidence acute fracture or pars defect. There is mild intervertebral spurring throughout the lumbar spine. Contrast material is present within the ureters and bladder from preceding CT. IMPRESSION: No evidence of acute lumbar spine injury.  Mild spondylosis. Electronically Signed   By: Richardean Sale M.D.   On: 06/23/2016 16:58   Mr Lumbar Spine Wo Contrast  Result Date: 06/24/2016 CLINICAL DATA:  Low back pain and bilateral groin pain. Necrotic RIGHT great toe. Recent diagnosis of infrarenal aortic occlusion.  EXAM: MRI LUMBAR SPINE WITHOUT CONTRAST TECHNIQUE: Multiplanar, multisequence MR imaging of the lumbar spine was performed. No intravenous contrast was administered. COMPARISON:  CT angiogram runoff June 23, 2016 FINDINGS: Multiple sequences are moderately motion degraded. SEGMENTATION: For the purposes of this report, the last well-formed intervertebral disc will be described as L5-S1. ALIGNMENT: No malalignment.  Maintenance of the lumbar lordosis. VERTEBRAE:Lumbar vertebral bodies are intact. Mild L3-4 and moderate L5-S1 disc height loss, with decreased T2 signal within the L3-4 and L5-S1 disc compatible with desiccation. Multilevel mild subacute on chronic discogenic endplate changes. Moderate acute to subacute discogenic endplate changes [id number]. No suspicious bone marrow signal. CONUS MEDULLARIS: Conus medullaris terminates at L1-2 and demonstrates normal morphology and signal characteristics. Limited assessment of cauda equina due to moderately motion degraded axial sequences. PARASPINAL AND SOFT TISSUES: Included prevertebral and paraspinal soft tissues are normal. Due to motion, axial sequences are do not reliably coregister to the sagittal sequences and, are nondiagnostic. At L5-S1 is a small broad-based disc bulge. Mild canal stenosis suspected at L3-4 and L4-5. Mild to moderate L5-S1 neural foraminal narrowing. Moderate lower lumbar facet arthropathy better seen on yesterday's CT. IMPRESSION: Motion degraded examination, nondiagnostic axial sequences. No acute fracture or malalignment. Mild canal stenosis suspected at L3-4 and L4-5. Mild to moderate L5-S1 neural foraminal narrowing. Electronically Signed   By: Elon Alas M.D.   On: 06/24/2016 23:00   Ct Angio Ao+bifem W &/or Wo Contrast  Result Date: 06/23/2016 CLINICAL DATA:  50 year old female with low back pain and numbness in the right foot and neck. EXAM: CT ANGIOGRAPHY OF ABDOMINAL AORTA WITH ILIOFEMORAL RUNOFF TECHNIQUE: Multidetector  CT imaging of the abdomen, pelvis and lower extremities was performed using the standard protocol during bolus administration of intravenous contrast. Multiplanar CT image reconstructions and MIPs were obtained to evaluate the vascular anatomy. CONTRAST:  100 cc Isovue 370 COMPARISON:  Abdominal CT dated 08/08/2013 FINDINGS: VASCULAR Aorta: There is intraluminal clot with high grade stenosis of the abdominal aorta starting just below the level of the renal arteries and extending distally with involvement of the infrarenal abdominal aorta and common iliac arteries. There is complete occlusion of the distal aorta starting at the level of the IMA and extending to the common iliac arteries bilaterally.  There is reconstitution of flow in the distal aspect of the common iliac arteries. There is patency of the external and internal iliac arteries bilaterally. The origins of the celiac axis, SMA appear patent. There is narrowing of the origin of the IMA. This vessel however remains patent. There is a classic celiac axis branching anatomy. The renal arteries are patent. 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 RIGHT Lower Extremity The common femoral artery, profundus femoris, superficial femoral artery, popliteal artery, posterior tibial arteries, and the peroneal artery appear patent. The plantar artery appears patent to the level of the mid metatarsal. The plantar digital arteries are not visualized. The anterior tibial artery appears patent along the calf. There is diminished flow in the distal aspect of the anterior tibial artery with absence of flow approximately 4.4 cm above the ankle and distal to this level. No flow identified in the dorsalis pedis artery. Correlation with clinical exam and duplex ultrasound recommended. LEFT Lower Extremity The common femoral artery, deep and superficial femoral artery is, popliteal artery, posterior tibial artery, plantar artery, fibular artery appear patent. The anterior  tibial artery is patent to the level of distal calf. There is diminished flow in the anterior tibial artery distally spurring approximately 10 cm above the ankle joint. There is loss of flow in the distal ATA starting approximately 6 cm above the ankle. No flow identified in the dorsalis pedis artery. Correlation with clinical exam and duplex ultrasound of the foot recommended. Review of the MIP images confirms the above findings. NON-VASCULAR Lower chest: The visualized lung bases are clear. No intra-abdominal free air or free fluid. Hepatobiliary: No focal liver abnormality is seen. No gallstones, gallbladder wall thickening, or biliary dilatation. Pancreas: Unremarkable. No pancreatic ductal dilatation or surrounding inflammatory changes. Spleen: Normal in size without focal abnormality. Adrenals/Urinary Tract: Adrenal glands are unremarkable. Kidneys are normal, without renal calculi, focal lesion, or hydronephrosis. Bladder is unremarkable. Stomach/Bowel: There is mild thickened appearance of the distal colon, likely related to underdistention. Colitis is less likely. There is no pericolonic stranding. No pneumatosis. There is no evidence of bowel obstruction. Normal appendix. Vascular/Lymphatic: The IVC appears unremarkable. No portal venous gas identified. There is no adenopathy. Reproductive: The uterus and the ovaries are grossly unremarkable. Other: None Musculoskeletal: Mild degenerative changes. T11 compression deformity with anterior wedging, similar to prior CT. No acute fracture. IMPRESSION: VASCULAR High grade stenosis of the abdominal aorta starting just below the level of the renal arteries with complete occlusion of the aorta starting at the level of the IMA and extending to the common iliac arteries bilaterally. There is reconstitution of the flow in the distal aspect of the common iliac arteries. The origins of the celiac axis, SMA, IMA as well as the origins of the renal arteries remain patent.  Diminished flow in the distal anterior tibial arteries with no detectable flow in the dorsalis pedis arteries bilaterally. Correlation with clinical exam and further evaluation with duplex ultrasound and vascular surgery consult recommended. NON-VASCULAR No acute intra-abdominal or pelvic pathology. These results were called by telephone at the time of interpretation on 06/23/2016 at 7:11 pm to nurse [facility], who verbally acknowledged these results. Electronically Signed   By: Anner Crete M.D.   On: 06/23/2016 19:26   Nm Myocar Multi W/spect W/wall Motion / Ef  Result Date: 06/24/2016 CLINICAL DATA:  Preoperative clearance needed.  Aortic occlusion. EXAM: MYOCARDIAL IMAGING WITH SPECT (REST AND PHARMACOLOGIC-STRESS) GATED LEFT VENTRICULAR WALL MOTION STUDY LEFT VENTRICULAR EJECTION FRACTION TECHNIQUE:  Standard myocardial SPECT imaging was performed after resting intravenous injection of 10 mCi Tc-31mtetrofosmin. Subsequently, intravenous infusion of Lexiscan was performed under the supervision of the Cardiology staff. At peak effect of the drug, 30 mCi Tc-950metrofosmin was injected intravenously and standard myocardial SPECT imaging was performed. Quantitative gated imaging was also performed to evaluate left ventricular wall motion, and estimate left ventricular ejection fraction. COMPARISON:  None. FINDINGS: Perfusion: There is gut uptake on both the rest and stress images. The gut uptake causes limited evaluation of the septal and inferoseptal wall. No evidence to suggest reversibility. No clear evidence for an infarct. Wall Motion: Abnormal wall motion involving the septal and inferoseptal wall. Concern for paradoxical motion along the anteroseptal wall and akinesia in the inferoseptal wall. Normal wall motion in the anterior and lateral walls. Left Ventricular Ejection Fraction: 56 % End diastolic volume 81 ml End systolic volume 36 ml IMPRESSION: 1. No reversible ischemia. However, the study has  technical limitations due to a large amount of gut uptake and limited evaluation of the septal and inferoseptal walls as described. 2. Abnormal wall motion involving the septal and inferoseptal wall as described. This abnormal wall motion may be associated with the gut uptake. Wall motion in these areas are equivocal. 3. Left ventricular ejection fraction is 56%. 4. Non invasive risk stratification*: Low *2012 Appropriate Use Criteria for Coronary Revascularization Focused Update: J Am Coll Cardiol. 200051;10(2):[email or web address] Electronically Signed   By: AdMarkus Daft.D.   On: 06/24/2016 13:52        Scheduled Meds: . amLODipine  2.5 mg Oral Daily  . docusate sodium  100 mg Oral BID  . enoxaparin (LOVENOX) injection  40 mg Subcutaneous Q24H  . gabapentin  600 mg Oral TID  . methocarbamol  500 mg Oral TID  . metoprolol tartrate  25 mg Oral BID  . nicotine  21 mg Transdermal Daily  . simvastatin  20 mg Oral q1800  . sodium chloride flush  3 mL Intravenous Q12H   Continuous Infusions: . sodium chloride 75 mL/hr at 06/25/16 0215     LOS: 2 days    Time spent: 35 minutes.     ReElmarie ShileyMD [facility] Pager [phone] 7PM-7AM, please contact night-coverage [email or web address] Password [email or web address] 06/25/2016, 1:06 PM

## 2016-06-25 NOTE — Progress Notes (Signed)
Echocardiogram 2D Echocardiogram has been performed.  Prestina Raigoza N Jaylia Pettus 06/25/2016, 2:11 PM 

## 2016-06-26 ENCOUNTER — Encounter (HOSPITAL_COMMUNITY): Payer: Self-pay | Admitting: General Practice

## 2016-06-26 DIAGNOSIS — N179 Acute kidney failure, unspecified: Secondary | ICD-10-CM

## 2016-06-26 DIAGNOSIS — Z01818 Encounter for other preprocedural examination: Secondary | ICD-10-CM

## 2016-06-26 DIAGNOSIS — E782 Mixed hyperlipidemia: Secondary | ICD-10-CM

## 2016-06-26 LAB — BASIC METABOLIC PANEL
ANION GAP: 8 (ref 5–15)
BUN: 17 mg/dL (ref 6–20)
CALCIUM: 8.9 mg/dL (ref 8.9–10.3)
CO2: 24 mmol/L (ref 22–32)
CREATININE: 0.6 mg/dL (ref 0.44–1.00)
Chloride: 103 mmol/L (ref 101–111)
GFR calc non Af Amer: >60 mL/min (ref >60)
Glucose, Bld: 126 mg/dL — ABNORMAL HIGH (ref 65–99)
Potassium: 3.7 mmol/L (ref 3.5–5.1)
SODIUM: 135 mmol/L (ref 135–145)

## 2016-06-26 MED ORDER — ZOLPIDEM TARTRATE 5 MG PO TABS
5.0000 mg | ORAL_TABLET | Freq: Every evening | ORAL | Status: DC | PRN
  Administered 2016-06-26 – 2016-07-02 (×5): 5 mg via ORAL
  Filled 2016-06-26 (×5): qty 1

## 2016-06-26 NOTE — Progress Notes (Addendum)
Patient Name: Frances Graham Date of Encounter: 06/26/2016  Primary Cardiologist: New (Dr. Diona Browner)  [facility] Problem List     Principal Problem:   Aortoiliac occlusive disease (HCC) Active Problems:   Peripheral vascular disease (HCC)   Acute kidney injury (HCC)   Essential hypertension   Ischemic ulcer of foot (HCC)   Cardiovascular risk factor   Leukocytosis   Tobacco abuse   Chronic low back pain   ADD (attention deficit disorder)     Subjective   No chest pain or sob. Poor insight of health condition.   Inpatient Medications    Scheduled Meds: . amLODipine  2.5 mg Oral Daily  . atorvastatin  40 mg Oral q1800  . docusate sodium  100 mg Oral BID  . enoxaparin (LOVENOX) injection  40 mg Subcutaneous Q24H  . fenofibrate  160 mg Oral Daily  . gabapentin  600 mg Oral TID  . methocarbamol  500 mg Oral TID  . metoprolol tartrate  25 mg Oral BID  . nicotine  7 mg Transdermal Daily  . sodium chloride flush  3 mL Intravenous Q12H   Continuous Infusions: . sodium chloride 75 mL/hr at 06/26/16 1043   PRN Meds:.acetaminophen **OR** acetaminophen, HYDROmorphone (DILAUDID) injection, lisdexamfetamine, ondansetron **OR** ondansetron (ZOFRAN) IV, oxyCODONE, traMADol   Vital Signs    Vitals:   06/25/16 1001 06/25/16 1300 06/25/16 2053 06/26/16 0456  BP: 129/75 116/77 134/68 139/62  Pulse:  (!) 109 (!) 116 100  Resp:  20 20 20   Temp:  98.4 F (36.9 C) 98.2 F (36.8 C) 97.9 F (36.6 C)  TempSrc:  Oral Oral Oral  SpO2:  99% 99% 100%    Intake/Output Summary (Last 24 hours) at 06/26/16 1312 Last data filed at 06/25/16 2204  Gross per 24 hour  Intake                0 ml  Output              820 ml  Net             -820 ml   There were no vitals filed for this visit.  Physical Exam   GEN: Well nourished, well developed, in no acute distress.  HEENT: Grossly normal.  Neck: Supple, no JVD, carotid bruits, or masses. Cardiac: RRR, no murmurs, rubs, or gallops.    Extremities: Black ischemic zone right great toe, blue discoloration of other toes on the left to lesser degree. Significantly diminished distal pulses Respiratory:  Respirations regular and unlabored, clear to auscultation bilaterally. GI: Soft, nontender, nondistended, BS + x 4. Neuro:  Strength and sensation are intact. Psych: AAOx3.  Normal affect.  Labs    CBC  Recent Labs  06/23/16 1340 06/24/16 0348 06/25/16 0215  WBC 13.0* 13.1* 10.1  NEUTROABS 7.0  --   --   HGB 12.7 12.1 11.5*  HCT 38.2 36.9 35.2*  MCV 95.7 94.9 96.4  PLT 412* 382 412*   Basic Metabolic Panel  Recent Labs  06/25/16 0215 06/26/16 0242  NA 137 135  K 3.4* 3.7  CL 103 103  CO2 23 24  GLUCOSE 115* 126*  BUN 19 17  CREATININE 0.64 0.60  CALCIUM 9.2 8.9   Liver Function Tests  Recent Labs  06/24/16 0348  AST 25  ALT 26  ALKPHOS 74  BILITOT 0.6  PROT 7.0  ALBUMIN 3.4*   No results for input(s): LIPASE, AMYLASE in the last 72 hours. Cardiac Enzymes No results for  input(s): CKTOTAL, CKMB, CKMBINDEX, TROPONINI in the last 72 hours. BNP Invalid input(s): POCBNP D-Dimer No results for input(s): DDIMER in the last 72 hours. Hemoglobin A1C  Recent Labs  06/23/16 1855  HGBA1C 5.8*   Fasting Lipid Panel  Recent Labs  06/24/16 0348  CHOL 227*  HDL 27*  LDLCALC 126*  TRIG 370*  CHOLHDL 8.4   Thyroid Function Tests  Recent Labs  06/23/16 1855 06/24/16 1227  TSH 0.295*  --   T3FREE  --  3.4    Telemetry    Sinus tachycardia at rate of 110-120s  ECG    N/A  Radiology    Mr Lumbar Spine Wo Contrast  Result Date: 06/24/2016 CLINICAL DATA:  Low back pain and bilateral groin pain. Necrotic RIGHT great toe. Recent diagnosis of infrarenal aortic occlusion. EXAM: MRI LUMBAR SPINE WITHOUT CONTRAST TECHNIQUE: Multiplanar, multisequence MR imaging of the lumbar spine was performed. No intravenous contrast was administered. COMPARISON:  CT angiogram runoff June 23, 2016  FINDINGS: Multiple sequences are moderately motion degraded. SEGMENTATION: For the purposes of this report, the last well-formed intervertebral disc will be described as L5-S1. ALIGNMENT: No malalignment.  Maintenance of the lumbar lordosis. VERTEBRAE:Lumbar vertebral bodies are intact. Mild L3-4 and moderate L5-S1 disc height loss, with decreased T2 signal within the L3-4 and L5-S1 disc compatible with desiccation. Multilevel mild subacute on chronic discogenic endplate changes. Moderate acute to subacute discogenic endplate changes T11-12. No suspicious bone marrow signal. CONUS MEDULLARIS: Conus medullaris terminates at L1-2 and demonstrates normal morphology and signal characteristics. Limited assessment of cauda equina due to moderately motion degraded axial sequences. PARASPINAL AND SOFT TISSUES: Included prevertebral and paraspinal soft tissues are normal. Due to motion, axial sequences are do not reliably coregister to the sagittal sequences and, are nondiagnostic. At L5-S1 is a small broad-based disc bulge. Mild canal stenosis suspected at L3-4 and L4-5. Mild to moderate L5-S1 neural foraminal narrowing. Moderate lower lumbar facet arthropathy better seen on yesterday's CT. IMPRESSION: Motion degraded examination, nondiagnostic axial sequences. No acute fracture or malalignment. Mild canal stenosis suspected at L3-4 and L4-5. Mild to moderate L5-S1 neural foraminal narrowing. Electronically Signed   By: Awilda Metro M.D.   On: 06/24/2016 23:00     Cardiac Studies   Stress test  IMPRESSION: 1. No reversible ischemia. However, the study has technical limitations due to a large amount of gut uptake and limited evaluation of the septal and inferoseptal walls as described.  2. Abnormal wall motion involving the septal and inferoseptal wall as described. This abnormal wall motion may be associated with the gut uptake. Wall motion in these areas are equivocal.  3. Left ventricular ejection  fraction is 56%.  4. Non invasive risk stratification*: Low  Echo 06/25/16 ------------------------------------------------------------------- LV EF: 60% -   65%  ------------------------------------------------------------------- Indications:      V728.1 Pre-op evaluation.  ------------------------------------------------------------------- History:   PMH:  No prior cardiac history.  Risk factors: Hypertension.  ------------------------------------------------------------------- Study Conclusions  - Left ventricle: The cavity size was normal. Systolic function was   normal. The estimated ejection fraction was in the range of 60%   to 65%. Wall motion was normal; there were no regional wall   motion abnormalities. Left ventricular diastolic function   parameters were normal. - Aortic valve: Trileaflet; normal thickness, mildly calcified   leaflets.  Patient Profile     Ms.Whitmoreis a 50 y.o.femalewith past history outlined below, currently being admitted for further management of symptomatic PAD. She has been found to  have an infrarenal aortic occlusion with reconstituted bilateral external iliac arteries and intact bilateral femoral-popliteal perfusion and three-vessel runoff. She reports low back pain with claudication, also some neuropathic symptoms. She has had blue discoloration of her toes, black ischemic zone involving her right great toe. Evaluation by Dr. Imogene Burnhen with [facility] noted. Possible aortobifemoral bypass is being considered.  Assessment & Plan    1. Preoperative evaluation a 50 year old woman with ongoing tobacco abuse, family history of CAD, hypertension, and symptomatic PAD with occluded infrarenal aorta. She is being considered for probable aortobifemoral bypass on Friday.  ECG is nonspecific.  Myoview low risk without reversible ischemia, abnormal WM --> likely due to gut uptake. Echo showed LV EF of 60-65%, no wm abnormality.  - She is cleared for surgery.    2. Essential hypertension by history -BP improved. Continue amlodipine and BB.   3. Ongoing tobacco abuse - Advised smoking cessation.  4. Sinus tachycardia - TSH was low, however normal free T3 & T4. Rate improved on metoprolol 25mg  BID. Up-titrate as needed.   5. Aortic occlusion with B LE ischemia - Plan for ABF by Dr. Edilia Boickson Friday  6. Dyslipidemia - 06/24/2016: Cholesterol 227; HDL 27; LDL Cholesterol 126; Triglycerides 370; VLDL 74  - Continue lipitor and finofibrate.  Will need LFT and lipid panel in 6 weeks.   Will sign off. Call with questions. MD to see.    Signed, Manson PasseyBhagat,Bhavinkumar, PA  06/26/2016, 1:12 PM    Patient seen and examined. Agree with assessment and plan. Pt has metabolic syndrome. Echo and Nuclear data reviewed with patients. She is given cardiac clearance for surgery. Long discussion with patient concerning aggressive lipid management with her mixed hyperlipidemia. She would also benefit from omega 3 FFA (lovaza or vascepa 2 cap bid) but can start at dc. Absolute tobacco cessation is essential.    Lennette Biharihomas A. Sakari Raisanen, MD, [facility] IncFACC 06/26/2016 3:31 PM

## 2016-06-26 NOTE — Progress Notes (Signed)
   VASCULAR SURGERY ASSESSMENT & PLAN:  Plan is to proceed with aortofemoral bypass grafting Friday if she is cleared from a cardiac standpoint. I have had multiple discussions with her about the procedure with potential complications and she is agreeable to proceed.  SUBJECTIVE: Having a hard time getting comfortable this morning but no specific complaints.  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 Ischemic changes to multiple toes on both feet.  LABS: Lab Results  Component Value Date   WBC 10.1 06/25/2016   HGB 11.5 (L) 06/25/2016   HCT 35.2 (L) 06/25/2016   MCV 96.4 06/25/2016   PLT 412 (H) 06/25/2016   Lab Results  Component Value Date   CREATININE 0.60 06/26/2016   Lab Results  Component Value Date   INR 1.08 06/23/2016   CBG (last 3)  No results for input(s): GLUCAP in the last 72 hours.  Principal Problem:   Aortoiliac occlusive disease (HCC) Active Problems:   Peripheral vascular disease (HCC)   Acute kidney injury (HCC)   Essential hypertension   Ischemic ulcer of foot (HCC)   Cardiovascular risk factor   Leukocytosis   Tobacco abuse   Chronic low back pain   ADD (attention deficit disorder)    Cari CarawayChris Shevelle Smither Beeper: [phone]: [phone] 06/26/2016

## 2016-06-26 NOTE — Progress Notes (Addendum)
PROGRESS NOTE  Frances Graham  OMV:[id number] DOB: [birth year]  DOA: 06/23/2016 PCP: No PCP Per Patient   Brief Narrative:  50 y.o.femalewith a Past Medical History of anxiety, ADHD, hypertension, TMJ and peripheral arterial disease who presents with nnonhealing toe ulcer. Presents complaining of back pain, shooting pain in her legs. She also has reported that she has also developed intermittent weakness in both of her legs and episodes of incontinence. She returns to the ER today complaining of more sharp pain in the right leg that worsens with activity and with increase in the discoloration.  CT angiogram of the lower extremities which reveals a chronic infrarenal aortic occlusion with reconstituted bilateral external iliac arteries and intact bilateral femoropopliteal perfusion with three-vessel runoff on both sides   Assessment & Plan:   Principal Problem:   Aortoiliac occlusive disease (HCC) Active Problems:   Peripheral vascular disease ([place])   Acute kidney injury ([place])   Essential hypertension   Ischemic ulcer of foot ([place])   Cardiovascular risk factor   Leukocytosis   Tobacco abuse   Chronic low back pain   ADD (attention deficit disorder)   Pre-operative clearance   Mixed hyperlipidemia   Aortoiliac occlusive disease/Peripheral vascular disease  - Risk factors for PVD include:ongoing tobacco abuse and hypertension. LDL 126. -Evaluated by cardiology for pre op clearance . Myoview stress test low risk, wall motion abnormalities. As per cardiology follow-up 10/4: EKG nonspecific. Myoview low risk without reversible ischemia, abnormal wall motion >likely due to gut uptake, echo showed LVEF 60-65 percent without wall motion abnormality. She has been cleared for surgery. - As per vascular surgery follow-up, probable aorto bifemoral bypass on 06/28/16.  Acute kidney injury ; While on ACE inhibitor with a thiazide diuretic and current renal function is 41/1.35 on 10/1 Hold  ACE inhibitor and thiazide diuretic. On admission cr 1.5-- trending down.  Resolved, cr at 0.60.   Ischemic ulcer of foot/Leukocytosis -No erythema or drainage;no issues with fevers -Ulcers appear consistent with dry gangrene and are quite localized -ESR; 54 -follow WBC trend > leukocytosis resolved. As stated above, plans for vascular surgery on 10/6.   HLD;  - started statins and fenofibrate. Will need repeat LFT and lipid panel in 6 weeks. As per cardiology, she would also benefit from omega 3 FFA (lovaza or vascepa 2 cap bid) but can start at dc  Chronic low back pain -Patient reports multiple years of chronic low back pain, shooting pain in her legs -Continue preadmission Neurontin and Robaxin -MRI with Mild canal stenosis suspected at L3-4 and L4-5. Mild to moderate L5-S1 neural foraminal narrowing. -Dr. Tyrell Antonio discussed MRI finding with neurosurgery Dr Cyndy Freeze, finding not impressive. Mild degeneration disk diseases. No surgical intervention needed for these finding. [name] likely would explain patient legs pain.   HTN;  -started low dose norvasc and on metoprolol. Better controlled.   Tobacco abuse -Patient reports smoking 5 cigarettes per day and has been smoking for greater than 25 years -Cessation counseling initiated -Nicotine patch  Attention deficit disorder -Continue Vyvanse  Low TSH, normal Free T 3 and Free T 4;  - needs repeat labs in 4 weeks.   Mild sinus tachycardia - Continue beta blockers. Thyroid test results as above  Anemia - Follow CBCs  DVT prophylaxis: Lovenox Code Status: Full Family Communication: None at bedside Disposition Plan: DC when medically improved post op.   Consultants:   Vascular Surgery  Cardiology  Procedures:   None  Antimicrobials:   None  Subjective: Anxious and tearful this morning. Patient's teenage daughter apparently was concerned about patient and hence patient seemed upset. Intermittent foot  pain. No chest pain or dizziness.  Objective:  Vitals:   06/25/16 1300 06/25/16 2053 06/26/16 0456 06/26/16 1430  BP: 116/77 134/68 139/62 139/65  Pulse: (!) 109 (!) 116 100 91  Resp: _0 Temp: 98.4 F (36.9 C) 98.2 F (36.8 C) 97.9 F (36.6 C) 98.7 F (37.1 C)  TempSrc: Oral Oral Oral Oral  SpO2: 99% 99% 100% 99%    Intake/Output Summary (Last 24 hours) at 06/26/16 1614 Last data filed at 06/25/16 2204  Gross per 24 hour  Intake                0 ml  Output              820 ml  Net             -820 ml   There were no vitals filed for this visit.  Examination:  General exam: Pleasant young female sitting up comfortably in bed. Respiratory system: Clear to auscultation. Respiratory effort normal. Cardiovascular system: S1 & S2 heard, RRR. No JVD, murmurs, rubs, gallops or clicks. No pedal edema. Telemetry: Sinus tachycardia in the 110s. Gastrointestinal system: Abdomen is nondistended, soft and nontender. No organomegaly or masses felt. Normal bowel sounds heard. Central nervous system: Alert and oriented. No focal neurological deficits. Extremities: Symmetric 5 x 5 power. 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 Ischemic changes to multiple toes on both feet.Diminished dorsalis pedis and posterior tibial pulses bilaterally and right leg/foot feels cooler than left. Skin: No rashes, lesions or ulcers Psychiatry: Judgement and insight appear normal. Mood & affect anxious.     Data Reviewed: I have personally reviewed following labs and imaging studies  CBC:  Recent Labs Lab 06/23/16 1340 06/24/16 0348 06/25/16 0215  WBC 13.0* 13.1* 10.1  NEUTROABS 7.0  --   --   HGB 12.7 12.1 11.5*  HCT 38.2 36.9 35.2*  MCV 95.7 94.9 96.4  PLT 412* 382 032*   Basic Metabolic Panel:  Recent Labs Lab 06/23/16 1340 06/24/16 0348 06/25/16 0215 06/26/16 0242  NA 136 136 137 135  K 4.0 3.4* 3.4* 3.7  CL 102 105 103 103  CO2 16* 19* 23 24  GLUCOSE 97 118* 115* 126*    BUN 41* 31* 19 17  CREATININE 1.35* 0.77 0.64 0.60  CALCIUM 10.3 9.6 9.2 8.9   GFR: CrCl cannot be calculated (Unknown ideal weight.). Liver Function Tests:  Recent Labs Lab 06/24/16 0348  AST 25  ALT 26  ALKPHOS 74  BILITOT 0.6  PROT 7.0  ALBUMIN 3.4*   No results for input(s): LIPASE, AMYLASE in the last 168 hours. No results for input(s): AMMONIA in the last 168 hours. Coagulation Profile:  Recent Labs Lab 06/23/16 1352  INR 1.08   Cardiac Enzymes: No results for input(s): CKTOTAL, CKMB, CKMBINDEX, TROPONINI in the last 168 hours. BNP (last 3 results) No results for input(s): PROBNP in the last 8760 hours. HbA1C:  Recent Labs  06/23/16 1855  HGBA1C 5.8*   CBG: No results for input(s): GLUCAP in the last 168 hours. Lipid Profile:  Recent Labs  06/24/16 0348  CHOL 227*  HDL 27*  LDLCALC 126*  TRIG 370*  CHOLHDL 8.4   Thyroid Function Tests:  Recent Labs  06/23/16 1855 06/24/16 1227  TSH 0.295*  --   FREET4  --  0.92  T3FREE  --  3.4   Anemia Panel: No results for input(s): VITAMINB12, FOLATE, FERRITIN, TIBC, IRON, RETICCTPCT in the last 72 hours.  Sepsis Labs: No results for input(s): PROCALCITON, LATICACIDVEN in the last 168 hours.  Recent Results (from the past 240 hour(s))  Culture, blood (Routine X 2) w Reflex to ID Panel     Status: None (Preliminary result)   Collection Time: 06/23/16  6:56 PM  Result Value Ref Range Status   Specimen Description BLOOD RIGHT ANTECUBITAL  Final   Special Requests BOTTLES DRAWN AEROBIC AND ANAEROBIC 6CC  Final   Culture NO GROWTH 3 DAYS  Final   Report Status PENDING  Incomplete  Culture, blood (Routine X 2) w Reflex to ID Panel     Status: None (Preliminary result)   Collection Time: 06/23/16  7:00 PM  Result Value Ref Range Status   Specimen Description BLOOD LEFT HAND  Final   Special Requests BOTTLES DRAWN AEROBIC AND ANAEROBIC 5CC  Final   Culture NO GROWTH 3 DAYS  Final   Report Status  PENDING  Incomplete         Radiology Studies: Mr Lumbar Spine Wo Contrast  Result Date: 06/24/2016 CLINICAL DATA:  Low back pain and bilateral groin pain. Necrotic RIGHT great toe. Recent diagnosis of infrarenal aortic occlusion. EXAM: MRI LUMBAR SPINE WITHOUT CONTRAST TECHNIQUE: Multiplanar, multisequence MR imaging of the lumbar spine was performed. No intravenous contrast was administered. COMPARISON:  CT angiogram runoff June 23, 2016 FINDINGS: Multiple sequences are moderately motion degraded. SEGMENTATION: For the purposes of this report, the last well-formed intervertebral disc will be described as L5-S1. ALIGNMENT: No malalignment.  Maintenance of the lumbar lordosis. VERTEBRAE:Lumbar vertebral bodies are intact. Mild L3-4 and moderate L5-S1 disc height loss, with decreased T2 signal within the L3-4 and L5-S1 disc compatible with desiccation. Multilevel mild subacute on chronic discogenic endplate changes. Moderate acute to subacute discogenic endplate changes H88-50. No suspicious bone marrow signal. CONUS MEDULLARIS: Conus medullaris terminates at L1-2 and demonstrates normal morphology and signal characteristics. Limited assessment of cauda equina due to moderately motion degraded axial sequences. PARASPINAL AND SOFT TISSUES: Included prevertebral and paraspinal soft tissues are normal. Due to motion, axial sequences are do not reliably coregister to the sagittal sequences and, are nondiagnostic. At L5-S1 is a small broad-based disc bulge. Mild canal stenosis suspected at L3-4 and L4-5. Mild to moderate L5-S1 neural foraminal narrowing. Moderate lower lumbar facet arthropathy better seen on yesterday's CT. IMPRESSION: Motion degraded examination, nondiagnostic axial sequences. No acute fracture or malalignment. Mild canal stenosis suspected at L3-4 and L4-5. Mild to moderate L5-S1 neural foraminal narrowing. Electronically Signed   By: Elon Alas M.D.   On: 06/24/2016 23:00         Scheduled Meds: . amLODipine  2.5 mg Oral Daily  . atorvastatin  40 mg Oral q1800  . docusate sodium  100 mg Oral BID  . enoxaparin (LOVENOX) injection  40 mg Subcutaneous Q24H  . fenofibrate  160 mg Oral Daily  . gabapentin  600 mg Oral TID  . methocarbamol  500 mg Oral TID  . metoprolol tartrate  25 mg Oral BID  . nicotine  7 mg Transdermal Daily  . sodium chloride flush  3 mL Intravenous Q12H   Continuous Infusions: . sodium chloride 75 mL/hr at 06/26/16 1043     LOS: 3 days      [facility], MD [facility] Pager [phone]  If 7PM-7AM, please contact night-coverage [email or web address] Password [email or web address]  06/26/2016, 4:14 PM

## 2016-06-27 MED ORDER — HYDROCODONE-ACETAMINOPHEN 5-325 MG PO TABS: 2.0000 | ORAL_TABLET | Freq: Once | ORAL | Status: DC

## 2016-06-27 MED ORDER — VANCOMYCIN HCL IN DEXTROSE 1-5 GM/200ML-% IV SOLN
1000.0000 mg | INTRAVENOUS | Status: AC
  Administered 2016-06-28: 1000 mg via INTRAVENOUS
  Filled 2016-06-27: qty 200

## 2016-06-27 MED ORDER — OXYCODONE HCL 5 MG PO TABS
5.0000 mg | ORAL_TABLET | Freq: Once | ORAL | Status: AC
  Administered 2016-06-27: 5 mg via ORAL
  Filled 2016-06-27: qty 1

## 2016-06-27 MED ORDER — ENOXAPARIN SODIUM 40 MG/0.4ML ~~LOC~~ SOLN: 40.0000 mg | SUBCUTANEOUS | Status: DC

## 2016-06-27 NOTE — Care Management Note (Addendum)
Case Management Note Donn PieriniKristi Katelen Luepke RN, BSN Unit 2W-Case Manager [id number]  Patient Details  Name: Denna Haggardngela Dickens MRN: [id number] Date of Birth: 12/01/1965  Subjective/Objective:  Pt admitted with  PAD and non healing ulcer                 Action/Plan: PTA pt lived at home - plan for OR on Friday 10/6- for aortobifemoral bypass - CM to follow post op for d/c needs- pt has Medicaid benefits- PCP- Kaleen MaskELKINS, WILSON OLIVER    Expected Discharge Date:                  Expected Discharge Plan:  Home/Self Care  In-House Referral:     Discharge planning Services  CM Consult  Post Acute Care Choice:    Choice offered to:     DME Arranged:    DME Agency:     HH Arranged:    HH Agency:     Status of Service:  In process, will continue to follow  If discussed at Long Length of Stay Meetings, dates discussed:    Additional Comments:  Darrold SpanWebster, Laretta Pyatt Hall, RN 06/27/2016, 10:23 AM

## 2016-06-27 NOTE — Anesthesia Preprocedure Evaluation (Addendum)
Anesthesia Evaluation  Patient identified by MRN, date of birth, ID band Patient awake    Reviewed: Allergy & Precautions, H&P , NPO status , Patient's Chart, lab work & pertinent test results  Airway Mallampati: I  TM Distance: >3 FB Neck ROM: Full    Dental no notable dental hx. (+) Dental Advisory Given, Poor Dentition   Pulmonary neg pulmonary ROS, former smoker,    Pulmonary exam normal breath sounds clear to auscultation       Cardiovascular hypertension, Pt. on medications + Peripheral Vascular Disease   Rhythm:Regular Rate:Normal     Neuro/Psych Anxiety negative neurological ROS  negative psych ROS   GI/Hepatic negative GI ROS, Neg liver ROS,   Endo/Other  negative endocrine ROS  Renal/GU negative Renal ROS  negative genitourinary   Musculoskeletal   Abdominal   Peds  (+) ADHD Hematology negative hematology ROS (+)   Anesthesia Other Findings   Reproductive/Obstetrics negative OB ROS                           Anesthesia Physical Anesthesia Plan  ASA: III  Anesthesia Plan: General   Post-op Pain Management:    Induction: Intravenous  Airway Management Planned: Oral ETT  Additional Equipment: Arterial line, CVP, Ultrasound Guidance Line Placement and PA Cath  Intra-op Plan:   Post-operative Plan: Extubation in OR and Possible Post-op intubation/ventilation  Informed Consent: I have reviewed the patients History and Physical, chart, labs and discussed the procedure including the risks, benefits and alternatives for the proposed anesthesia with the patient or authorized representative who has indicated his/her understanding and acceptance.   Dental advisory given  Plan Discussed with: CRNA, Anesthesiologist and Surgeon  Anesthesia Plan Comments:        Anesthesia Quick Evaluation

## 2016-06-27 NOTE — Progress Notes (Addendum)
  Progress Note  06/27/2016 8:32 AM Hospital Day 4  Subjective:  Just ready to get her surgery over with and feel better  Afebrile 90's-110's NSR 100's-130's systolic 100% RA  Vitals:   06/27/16 0137 06/27/16 0423  BP: (!) 105/55 (!) 125/58  Pulse: (!) 112 93  Resp:  (!) 22  Temp:  98.2 F (36.8 C)    Physical Exam: Lungs:  Non labored Extremities:  Dark right great toe; no palpable pulses  CBC    Component Value Date/Time   WBC 10.1 06/25/2016 0215   RBC 3.65 (L) 06/25/2016 0215   HGB 11.5 (L) 06/25/2016 0215   HCT 35.2 (L) 06/25/2016 0215   PLT 412 (H) 06/25/2016 0215   MCV 96.4 06/25/2016 0215   MCH 31.5 06/25/2016 0215   MCHC 32.7 06/25/2016 0215   RDW 13.4 06/25/2016 0215   LYMPHSABS 4.4 (H) 06/23/2016 1340   MONOABS 1.4 (H) 06/23/2016 1340   EOSABS 0.2 06/23/2016 1340   BASOSABS 0.0 06/23/2016 1340    BMET    Component Value Date/Time   NA 135 06/26/2016 0242   K 3.7 06/26/2016 0242   CL 103 06/26/2016 0242   CO2 24 06/26/2016 0242   GLUCOSE 126 (H) 06/26/2016 0242   BUN 17 06/26/2016 0242   CREATININE 0.60 06/26/2016 0242   CALCIUM 8.9 06/26/2016 0242   GFRNONAA >60 06/26/2016 0242   GFRAA >60 06/26/2016 0242    INR    Component Value Date/Time   INR 1.08 06/23/2016 1352    No intake or output data in the 24 hours ending 06/27/16 [id number]   Assessment/Plan:  50 y.o. female with occluded infrarenal aorta Hospital Day 4  -pt has been cleared by cardiology -she had some numbness of there RLE but some better this morning.  Hopefully with surgery, this will help with her pain/numbness -will pre op for aortobifemoral bypass tomorrow by Dr. Edilia Boickson -Dr. Edilia Boickson to see pt later today  Doreatha MassedSamantha Rhyne, PA-C [facility] [phone] 06/27/2016 8:32 AM  I have interviewed the patient and examined the patient. I agree with the findings by the PA. I have again discussed the plans to proceed with AFBG tomorrow. She is agreeable.    Cari Carawayhris Mabry Santarelli, MD [phone]

## 2016-06-27 NOTE — Progress Notes (Signed)
PROGRESS NOTE  Frances Graham  KYH:[id number] DOB: 09/14/1966  DOA: 06/23/2016 PCP: Leonard Downing, MD   Brief Narrative:  50 y.o.femalewith a Past Medical History of anxiety, ADHD, hypertension, TMJ and peripheral arterial disease who presents with nnonhealing toe ulcer. Presents complaining of back pain, shooting pain in her legs. She also has reported that she has also developed intermittent weakness in both of her legs and episodes of incontinence. She returns to the ER today complaining of more sharp pain in the right leg that worsens with activity and with increase in the discoloration.  CT angiogram of the lower extremities which reveals a chronic infrarenal aortic occlusion with reconstituted bilateral external iliac arteries and intact bilateral femoropopliteal perfusion with three-vessel runoff on both sides   Assessment & Plan:   Principal Problem:   Aortoiliac occlusive disease (HCC) Active Problems:   Peripheral vascular disease ([place])   Acute kidney injury ([place])   Essential hypertension   Ischemic ulcer of foot ([place])   Cardiovascular risk factor   Leukocytosis   Tobacco abuse   Chronic low back pain   ADD (attention deficit disorder)   Pre-operative clearance   Mixed hyperlipidemia   Aortoiliac occlusive disease/Peripheral vascular disease  - Risk factors for PVD include:ongoing tobacco abuse and hypertension. LDL 126. -Evaluated by cardiology for pre op clearance . Myoview stress test low risk, wall motion abnormalities. As per cardiology follow-up 10/4: EKG nonspecific. Myoview low risk without reversible ischemia, abnormal wall motion >likely due to gut uptake, echo showed LVEF 60-65 percent without wall motion abnormality. She has been cleared for surgery. - As per vascular surgery follow-up, probable aorto bifemoral bypass on 06/28/16.  Acute kidney injury ; While on ACE inhibitor with a thiazide diuretic and current renal function is 41/1.35 on  10/1 Hold ACE inhibitor and thiazide diuretic. On admission cr 1.5-- trending down.  Resolved, cr at 0.60.   Ischemic ulcer of foot/Leukocytosis -No erythema or drainage;no issues with fevers -Ulcers appear consistent with dry gangrene and are quite localized -ESR; 54 -follow WBC trend > leukocytosis resolved. As stated above, plans for vascular surgery on 10/6.   HLD;  - started statins and fenofibrate. Will need repeat LFT and lipid panel in 6 weeks. As per cardiology, she would also benefit from omega 3 FFA (lovaza or vascepa 2 cap bid) but can start at dc  Chronic low back pain -Patient reports multiple years of chronic low back pain, shooting pain in her legs -Continue preadmission Neurontin and Robaxin -MRI with Mild canal stenosis suspected at L3-4 and L4-5. Mild to moderate L5-S1 neural foraminal narrowing. -Dr. Tyrell Antonio discussed MRI finding with neurosurgery Dr Cyndy Freeze, finding not impressive. Mild degeneration disk diseases. No surgical intervention needed for these finding. [name] likely would explain patient legs pain.   HTN;  -started low dose norvasc and on metoprolol. Better controlled.   Tobacco abuse -Patient reports smoking 5 cigarettes per day and has been smoking for greater than 25 years -Cessation counseling initiated -Nicotine patch  Attention deficit disorder -Continue Vyvanse  Low TSH, normal Free T 3 and Free T 4;  - needs repeat labs in 4 weeks.   Mild sinus tachycardia - Continue beta blockers. Thyroid test results as above. Resolved.  Anemia - Follow CBCs  DVT prophylaxis: Lovenox Code Status: Full Family Communication: None at bedside Disposition Plan: DC when medically improved post op.   Consultants:   Vascular Surgery  Cardiology  Procedures:   None  Antimicrobials:   None  Subjective: Ongoing issues with bilateral feet pain, controlled with current pain regimen. Denies any other complaints. Aware of surgical plans  tomorrow at 8:30 AM.  Objective:  Vitals:   06/26/16 1430 06/26/16 1943 06/27/16 0137 06/27/16 0423  BP: 139/65 (!) 128/101 (!) 105/55 (!) 125/58  Pulse: 91 100 (!) 112 93  Resp: 18 18  (!) 22  Temp: 98.7 F (37.1 C) 98.3 F (36.8 C)  98.2 F (36.8 C)  TempSrc: Oral Oral  Oral  SpO2: 99% 96%  100%    Intake/Output Summary (Last 24 hours) at 06/27/16 1329 Last data filed at 06/27/16 0854  Gross per 24 hour  Intake               75 ml  Output                0 ml  Net               75 ml   There were no vitals filed for this visit.  Examination:  General exam: Pleasant young female sitting up comfortably in bed. Respiratory system: Clear to auscultation. Respiratory effort normal. Cardiovascular system: S1 & S2 heard, RRR. No JVD, murmurs, rubs, gallops or clicks. No pedal edema. Telemetry: Sinus Rhythm. Gastrointestinal system: Abdomen is nondistended, soft and nontender. No organomegaly or masses felt. Normal bowel sounds heard. Central nervous system: Alert and oriented. No focal neurological deficits. Extremities: Symmetric 5 x 5 power. 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 Ischemic changes to multiple toes on both feet.Diminished dorsalis pedis and posterior tibial pulses bilaterally and right leg/foot feels cooler than left. Skin: No rashes, lesions or ulcers Psychiatry: Judgement and insight appear normal. Mood & affect anxious.     Data Reviewed: I have personally reviewed following labs and imaging studies  CBC:  Recent Labs Lab 06/23/16 1340 06/24/16 0348 06/25/16 0215  WBC 13.0* 13.1* 10.1  NEUTROABS 7.0  --   --   HGB 12.7 12.1 11.5*  HCT 38.2 36.9 35.2*  MCV 95.7 94.9 96.4  PLT 412* 382 809*   Basic Metabolic Panel:  Recent Labs Lab 06/23/16 1340 06/24/16 0348 06/25/16 0215 06/26/16 0242  NA 136 136 137 135  K 4.0 3.4* 3.4* 3.7  CL 102 105 103 103  CO2 16* 19* 23 24  GLUCOSE 97 118* 115* 126*  BUN 41* 31* 19 17  CREATININE 1.35* 0.77 0.64  0.60  CALCIUM 10.3 9.6 9.2 8.9   GFR: CrCl cannot be calculated (Unknown ideal weight.). Liver Function Tests:  Recent Labs Lab 06/24/16 0348  AST 25  ALT 26  ALKPHOS 74  BILITOT 0.6  PROT 7.0  ALBUMIN 3.4*   No results for input(s): LIPASE, AMYLASE in the last 168 hours. No results for input(s): AMMONIA in the last 168 hours. Coagulation Profile:  Recent Labs Lab 06/23/16 1352  INR 1.08   Cardiac Enzymes: No results for input(s): CKTOTAL, CKMB, CKMBINDEX, TROPONINI in the last 168 hours. BNP (last 3 results) No results for input(s): PROBNP in the last 8760 hours. HbA1C: No results for input(s): HGBA1C in the last 72 hours. CBG: No results for input(s): GLUCAP in the last 168 hours. Lipid Profile: No results for input(s): CHOL, HDL, LDLCALC, TRIG, CHOLHDL, LDLDIRECT in the last 72 hours. Thyroid Function Tests: No results for input(s): TSH, T4TOTAL, FREET4, T3FREE, THYROIDAB in the last 72 hours. Anemia Panel: No results for input(s): VITAMINB12, FOLATE, FERRITIN, TIBC, IRON, RETICCTPCT in the last 72 hours.  Sepsis Labs:  No results for input(s): PROCALCITON, LATICACIDVEN in the last 168 hours.  Recent Results (from the past 240 hour(s))  Culture, blood (Routine X 2) w Reflex to ID Panel     Status: None (Preliminary result)   Collection Time: 06/23/16  6:56 PM  Result Value Ref Range Status   Specimen Description BLOOD RIGHT ANTECUBITAL  Final   Special Requests BOTTLES DRAWN AEROBIC AND ANAEROBIC 6CC  Final   Culture NO GROWTH 3 DAYS  Final   Report Status PENDING  Incomplete  Culture, blood (Routine X 2) w Reflex to ID Panel     Status: None (Preliminary result)   Collection Time: 06/23/16  7:00 PM  Result Value Ref Range Status   Specimen Description BLOOD LEFT HAND  Final   Special Requests BOTTLES DRAWN AEROBIC AND ANAEROBIC 5CC  Final   Culture NO GROWTH 3 DAYS  Final   Report Status PENDING  Incomplete         Radiology Studies: No results  found.      Scheduled Meds: . amLODipine  2.5 mg Oral Daily  . atorvastatin  40 mg Oral q1800  . docusate sodium  100 mg Oral BID  . enoxaparin (LOVENOX) injection  40 mg Subcutaneous Q24H  . fenofibrate  160 mg Oral Daily  . gabapentin  600 mg Oral TID  . methocarbamol  500 mg Oral TID  . metoprolol tartrate  25 mg Oral BID  . nicotine  7 mg Transdermal Daily  . sodium chloride flush  3 mL Intravenous Q12H  . [START ON 06/28/2016] vancomycin  1,000 mg Intravenous To SS-Surg   Continuous Infusions:     LOS: 4 days      [facility], MD [facility] Pager [phone]  If 7PM-7AM, please contact night-coverage [email or web address] Password [email or web address] 06/27/2016, 1:29 PM

## 2016-06-28 ENCOUNTER — Encounter (HOSPITAL_COMMUNITY)
Admission: EM | Disposition: A | Discharge status: Home / Self Care | Payer: Self-pay | Source: Home / Self Care | Attending: Vascular Surgery

## 2016-06-28 ENCOUNTER — Inpatient Hospital Stay (HOSPITAL_COMMUNITY): Payer: Medicaid Other

## 2016-06-28 ENCOUNTER — Encounter (HOSPITAL_COMMUNITY): Payer: Self-pay | Admitting: Certified Registered Nurse Anesthetist

## 2016-06-28 ENCOUNTER — Inpatient Hospital Stay (HOSPITAL_COMMUNITY): Payer: Medicaid Other | Admitting: Anesthesiology

## 2016-06-28 HISTORY — PX: AORTA - BILATERAL FEMORAL ARTERY BYPASS GRAFT: SHX1175

## 2016-06-28 LAB — POCT I-STAT 4, (NA,K, GLUC, HGB,HCT)
Glucose, Bld: 112 mg/dL — ABNORMAL HIGH (ref 65–99)
HCT: 25 % — ABNORMAL LOW (ref 36.0–46.0)
Hemoglobin: 8.5 g/dL — ABNORMAL LOW (ref 12.0–15.0)
Potassium: 3.4 mmol/L — ABNORMAL LOW (ref 3.5–5.1)
SODIUM: 138 mmol/L (ref 135–145)

## 2016-06-28 LAB — CBC
HCT: 29.1 % — ABNORMAL LOW (ref 36.0–46.0)
HEMATOCRIT: 30.9 % — AB (ref 36.0–46.0)
HEMOGLOBIN: 9.4 g/dL — AB (ref 12.0–15.0)
Hemoglobin: 9.8 g/dL — ABNORMAL LOW (ref 12.0–15.0)
MCH: 30.9 pg (ref 26.0–34.0)
MCH: 31.1 pg (ref 26.0–34.0)
MCHC: 31.7 g/dL (ref 30.0–36.0)
MCHC: 32.3 g/dL (ref 30.0–36.0)
MCV: 97.5 fL (ref 78.0–100.0)
MCV: 96.4 fL (ref 78.0–100.0)
PLATELETS: 444 10*3/uL — AB (ref 150–400)
Platelets: 295 10*3/uL (ref 150–400)
RBC: 3.17 MIL/uL — ABNORMAL LOW (ref 3.87–5.11)
RBC: 3.02 MIL/uL — AB (ref 3.87–5.11)
RDW: 13.3 % (ref 11.5–15.5)
RDW: 13.3 % (ref 11.5–15.5)
WBC: 11.5 10*3/uL — AB (ref 4.0–10.5)
WBC: 12.5 10*3/uL — AB (ref 4.0–10.5)

## 2016-06-28 LAB — BASIC METABOLIC PANEL
Anion gap: 10 (ref 5–15)
Anion gap: 10 (ref 5–15)
BUN: 9 mg/dL (ref 6–20)
BUN: 10 mg/dL (ref 6–20)
CHLORIDE: 102 mmol/L (ref 101–111)
CHLORIDE: 102 mmol/L (ref 101–111)
CO2: 25 mmol/L (ref 22–32)
CO2: 24 mmol/L (ref 22–32)
CREATININE: 0.54 mg/dL (ref 0.44–1.00)
CREATININE: 0.62 mg/dL (ref 0.44–1.00)
Calcium: 9 mg/dL (ref 8.9–10.3)
Calcium: 8.5 mg/dL — ABNORMAL LOW (ref 8.9–10.3)
GFR calc Af Amer: >60 mL/min (ref >60)
GFR calc Af Amer: >60 mL/min (ref >60)
GFR calc non Af Amer: >60 mL/min (ref >60)
GFR calc non Af Amer: >60 mL/min (ref >60)
GLUCOSE: 101 mg/dL — AB (ref 65–99)
GLUCOSE: 131 mg/dL — AB (ref 65–99)
POTASSIUM: 3.6 mmol/L (ref 3.5–5.1)
Potassium: 3.5 mmol/L (ref 3.5–5.1)
SODIUM: 137 mmol/L (ref 135–145)
SODIUM: 136 mmol/L (ref 135–145)

## 2016-06-28 LAB — POCT I-STAT 7, (LYTES, BLD GAS, ICA,H+H)
ACID-BASE EXCESS: 2 mmol/L (ref 0.0–2.0)
BICARBONATE: 26 mmol/L (ref 20.0–28.0)
Bicarbonate: 27.7 mmol/L (ref 20.0–28.0)
CALCIUM ION: 1.19 mmol/L (ref 1.15–1.40)
Calcium, Ion: 1.11 mmol/L — ABNORMAL LOW (ref 1.15–1.40)
HCT: 24 % — ABNORMAL LOW (ref 36.0–46.0)
HEMATOCRIT: 25 % — AB (ref 36.0–46.0)
HEMOGLOBIN: 8.5 g/dL — AB (ref 12.0–15.0)
HEMOGLOBIN: 8.2 g/dL — AB (ref 12.0–15.0)
O2 SAT: 100 %
O2 Saturation: 100 %
PH ART: 7.373 (ref 7.350–7.450)
PO2 ART: 224 mmHg — AB (ref 83.0–108.0)
POTASSIUM: 3.5 mmol/L (ref 3.5–5.1)
Potassium: 3.4 mmol/L — ABNORMAL LOW (ref 3.5–5.1)
SODIUM: 137 mmol/L (ref 135–145)
Sodium: 137 mmol/L (ref 135–145)
TCO2: 29 mmol/L (ref 0–100)
TCO2: 27 mmol/L (ref 0–100)
pCO2 arterial: 47.6 mmHg (ref 32.0–48.0)
pCO2 arterial: 47.1 mmHg (ref 32.0–48.0)
pH, Arterial: 7.35 (ref 7.350–7.450)
pO2, Arterial: 218 mmHg — ABNORMAL HIGH (ref 83.0–108.0)

## 2016-06-28 LAB — CULTURE, BLOOD (ROUTINE X 2)
CULTURE: NO GROWTH
CULTURE: NO GROWTH

## 2016-06-28 LAB — PREPARE RBC (CROSSMATCH)

## 2016-06-28 LAB — PROTIME-INR
INR: 1.15
Prothrombin Time: 14.8 seconds (ref 11.4–15.2)

## 2016-06-28 LAB — SURGICAL PCR SCREEN
MRSA, PCR: NEGATIVE
Staphylococcus aureus: POSITIVE — AB

## 2016-06-28 LAB — MAGNESIUM: Magnesium: 1.6 mg/dL — ABNORMAL LOW (ref 1.7–2.4)

## 2016-06-28 LAB — APTT: aPTT: 30 seconds (ref 24–36)

## 2016-06-28 LAB — ABO/RH: ABO/RH(D): A POS

## 2016-06-28 SURGERY — CREATION, BYPASS, ARTERIAL, AORTA TO FEMORAL, BILATERAL, USING GRAFT
Anesthesia: General | Site: Abdomen | Laterality: Bilateral

## 2016-06-28 MED ORDER — HYDROMORPHONE HCL 1 MG/ML IJ SOLN
0.2500 mg | INTRAMUSCULAR | Status: DC | PRN
  Administered 2016-06-28 (×4): 0.5 mg via INTRAVENOUS

## 2016-06-28 MED ORDER — GABAPENTIN 250 MG/5ML PO SOLN
600.0000 mg | Freq: Three times a day (TID) | ORAL | Status: DC
  Administered 2016-06-30: 600 mg via ORAL
  Filled 2016-06-28 (×5): qty 12

## 2016-06-28 MED ORDER — ONDANSETRON HCL 4 MG/2ML IJ SOLN: 4.0000 mg | Freq: Once | INTRAMUSCULAR | Status: DC | PRN

## 2016-06-28 MED ORDER — LABETALOL HCL 5 MG/ML IV SOLN: 10.0000 mg | INTRAVENOUS | Status: DC | PRN

## 2016-06-28 MED ORDER — CHLORHEXIDINE GLUCONATE CLOTH 2 % EX PADS: 6.0000 | MEDICATED_PAD | Freq: Every day | CUTANEOUS | Status: DC

## 2016-06-28 MED ORDER — MIDAZOLAM HCL 2 MG/2ML IJ SOLN
INTRAMUSCULAR | Status: AC
  Filled 2016-06-28: qty 2

## 2016-06-28 MED ORDER — ALBUMIN HUMAN 5 % IV SOLN
INTRAVENOUS | Status: DC | PRN
  Administered 2016-06-28 (×3): via INTRAVENOUS

## 2016-06-28 MED ORDER — SODIUM CHLORIDE 0.9 % IV SOLN
INTRAVENOUS | Status: DC
  Administered 2016-06-29: 125 mL/h via INTRAVENOUS

## 2016-06-28 MED ORDER — FENTANYL CITRATE (PF) 100 MCG/2ML IJ SOLN
INTRAMUSCULAR | Status: DC | PRN
  Administered 2016-06-28 (×2): 100 ug via INTRAVENOUS
  Administered 2016-06-28 (×2): 50 ug via INTRAVENOUS
  Administered 2016-06-28: 100 ug via INTRAVENOUS
  Administered 2016-06-28 (×4): 50 ug via INTRAVENOUS
  Administered 2016-06-28: 100 ug via INTRAVENOUS

## 2016-06-28 MED ORDER — PHENYLEPHRINE HCL 10 MG/ML IJ SOLN
INTRAMUSCULAR | Status: DC | PRN
  Administered 2016-06-28 (×3): 40 ug via INTRAVENOUS

## 2016-06-28 MED ORDER — SUGAMMADEX SODIUM 200 MG/2ML IV SOLN
INTRAVENOUS | Status: DC | PRN
  Administered 2016-06-28: 200 mg via INTRAVENOUS

## 2016-06-28 MED ORDER — FENTANYL CITRATE (PF) 100 MCG/2ML IJ SOLN
INTRAMUSCULAR | Status: AC
  Filled 2016-06-28: qty 4

## 2016-06-28 MED ORDER — DEXAMETHASONE SODIUM PHOSPHATE 10 MG/ML IJ SOLN
INTRAMUSCULAR | Status: AC
  Filled 2016-06-28: qty 1

## 2016-06-28 MED ORDER — ORAL CARE MOUTH RINSE
15.0000 mL | Freq: Two times a day (BID) | OROMUCOSAL | Status: DC
  Administered 2016-06-28 – 2016-07-03 (×5): 15 mL via OROMUCOSAL

## 2016-06-28 MED ORDER — GLYCOPYRROLATE 0.2 MG/ML IJ SOLN
INTRAMUSCULAR | Status: DC | PRN
  Administered 2016-06-28: .2 mg via INTRAVENOUS

## 2016-06-28 MED ORDER — OXYCODONE HCL 5 MG/5ML PO SOLN
5.0000 mg | Freq: Once | ORAL | Status: AC | PRN
  Administered 2016-06-28: 5 mg via ORAL

## 2016-06-28 MED ORDER — PANTOPRAZOLE SODIUM 40 MG PO PACK
40.0000 mg | PACK | Freq: Every day | ORAL | Status: DC
  Administered 2016-07-02: 40 mg via ORAL
  Filled 2016-06-28: qty 20

## 2016-06-28 MED ORDER — LIDOCAINE 2% (20 MG/ML) 5 ML SYRINGE
INTRAMUSCULAR | Status: AC
  Filled 2016-06-28: qty 5

## 2016-06-28 MED ORDER — SODIUM CHLORIDE 0.9 % IV SOLN
INTRAVENOUS | Status: DC | PRN
  Administered 2016-06-28: 500 mL

## 2016-06-28 MED ORDER — ROCURONIUM BROMIDE 100 MG/10ML IV SOLN
INTRAVENOUS | Status: DC | PRN
  Administered 2016-06-28: 40 mg via INTRAVENOUS
  Administered 2016-06-28: 60 mg via INTRAVENOUS
  Administered 2016-06-28: 20 mg via INTRAVENOUS
  Administered 2016-06-28: 30 mg via INTRAVENOUS

## 2016-06-28 MED ORDER — SUCCINYLCHOLINE CHLORIDE 200 MG/10ML IV SOSY
PREFILLED_SYRINGE | INTRAVENOUS | Status: AC
  Filled 2016-06-28: qty 10

## 2016-06-28 MED ORDER — GUAIFENESIN-DM 100-10 MG/5ML PO SYRP: 15.0000 mL | ORAL_SOLUTION | ORAL | Status: DC | PRN

## 2016-06-28 MED ORDER — HYDROMORPHONE HCL 1 MG/ML IJ SOLN
INTRAMUSCULAR | Status: AC
  Filled 2016-06-28: qty 2

## 2016-06-28 MED ORDER — GLYCOPYRROLATE 0.2 MG/ML IV SOSY
PREFILLED_SYRINGE | INTRAVENOUS | Status: AC
  Filled 2016-06-28: qty 3

## 2016-06-28 MED ORDER — PHENOL 1.4 % MT LIQD
1.0000 | OROMUCOSAL | Status: DC | PRN
Start: 2016-06-28 — End: 2016-07-03
  Administered 2016-07-02: 1 via OROMUCOSAL
  Filled 2016-06-28 (×2): qty 177

## 2016-06-28 MED ORDER — PHENYLEPHRINE HCL 10 MG/ML IJ SOLN
INTRAVENOUS | Status: DC | PRN
  Administered 2016-06-28: 20 ug/min via INTRAVENOUS

## 2016-06-28 MED ORDER — LACTATED RINGERS IV SOLN
INTRAVENOUS | Status: DC | PRN
  Administered 2016-06-28 (×2): via INTRAVENOUS

## 2016-06-28 MED ORDER — ROCURONIUM BROMIDE 10 MG/ML (PF) SYRINGE
PREFILLED_SYRINGE | INTRAVENOUS | Status: AC
  Filled 2016-06-28: qty 20

## 2016-06-28 MED ORDER — MUPIROCIN 2 % EX OINT
1.0000 "application " | TOPICAL_OINTMENT | Freq: Two times a day (BID) | CUTANEOUS | Status: DC
  Administered 2016-06-28 – 2016-07-02 (×4): 1 via NASAL
  Filled 2016-06-28 (×2): qty 22

## 2016-06-28 MED ORDER — ALUM & MAG HYDROXIDE-SIMETH 200-200-20 MG/5ML PO SUSP: 15.0000 mL | ORAL | Status: DC | PRN

## 2016-06-28 MED ORDER — CHLORHEXIDINE GLUCONATE 0.12 % MT SOLN
15.0000 mL | Freq: Two times a day (BID) | OROMUCOSAL | Status: DC
  Administered 2016-06-28 – 2016-07-03 (×9): 15 mL via OROMUCOSAL
  Filled 2016-06-28 (×6): qty 15

## 2016-06-28 MED ORDER — HEPARIN SODIUM (PORCINE) 1000 UNIT/ML IJ SOLN
INTRAMUSCULAR | Status: DC | PRN
  Administered 2016-06-28: 9000 [IU] via INTRAVENOUS

## 2016-06-28 MED ORDER — ACETAMINOPHEN 10 MG/ML IV SOLN
INTRAVENOUS | Status: AC
  Filled 2016-06-28: qty 100

## 2016-06-28 MED ORDER — SODIUM CHLORIDE 0.9 % IV SOLN
0.2500 mg/kg/h | INTRAVENOUS | Status: AC
  Administered 2016-06-28: .3 mg/kg/h via INTRAVENOUS
  Filled 2016-06-28: qty 2

## 2016-06-28 MED ORDER — PANTOPRAZOLE SODIUM 40 MG PO TBEC
40.0000 mg | DELAYED_RELEASE_TABLET | Freq: Every day | ORAL | Status: DC
  Filled 2016-06-28: qty 1

## 2016-06-28 MED ORDER — SUCCINYLCHOLINE CHLORIDE 20 MG/ML IJ SOLN
INTRAMUSCULAR | Status: DC | PRN
  Administered 2016-06-28: 100 mg via INTRAVENOUS

## 2016-06-28 MED ORDER — SODIUM CHLORIDE 0.9 % IV SOLN: 500.0000 mL | Freq: Once | INTRAVENOUS | Status: DC | PRN

## 2016-06-28 MED ORDER — PROPOFOL 10 MG/ML IV BOLUS
INTRAVENOUS | Status: DC | PRN
  Administered 2016-06-28 (×2): 100 mg via INTRAVENOUS
  Administered 2016-06-28: 30 mg via INTRAVENOUS

## 2016-06-28 MED ORDER — MANNITOL 25 % IV SOLN
50.0000 g | INTRAVENOUS | Status: AC
  Administered 2016-06-28: 25 g via INTRAVENOUS
  Filled 2016-06-28: qty 200

## 2016-06-28 MED ORDER — VANCOMYCIN HCL IN DEXTROSE 1-5 GM/200ML-% IV SOLN
INTRAVENOUS | Status: AC
  Filled 2016-06-28: qty 200

## 2016-06-28 MED ORDER — LACTATED RINGERS IV SOLN
INTRAVENOUS | Status: DC | PRN
  Administered 2016-06-28: 07:00:00 via INTRAVENOUS

## 2016-06-28 MED ORDER — ONDANSETRON HCL 4 MG/2ML IJ SOLN
INTRAMUSCULAR | Status: AC
  Filled 2016-06-28: qty 2

## 2016-06-28 MED ORDER — MAGNESIUM SULFATE 2 GM/50ML IV SOLN
2.0000 g | Freq: Every day | INTRAVENOUS | Status: DC | PRN
  Filled 2016-06-28: qty 50

## 2016-06-28 MED ORDER — ROCURONIUM BROMIDE 10 MG/ML (PF) SYRINGE
PREFILLED_SYRINGE | INTRAVENOUS | Status: AC
  Filled 2016-06-28: qty 10

## 2016-06-28 MED ORDER — PHENYLEPHRINE HCL 10 MG/ML IJ SOLN: INTRAMUSCULAR | Status: DC | PRN

## 2016-06-28 MED ORDER — ENOXAPARIN SODIUM 30 MG/0.3ML ~~LOC~~ SOLN
30.0000 mg | SUBCUTANEOUS | Status: DC
  Administered 2016-06-29 – 2016-07-02 (×4): 30 mg via SUBCUTANEOUS
  Filled 2016-06-28 (×4): qty 0.3

## 2016-06-28 MED ORDER — HYDROMORPHONE HCL 1 MG/ML IJ SOLN: 0.2500 mg | INTRAMUSCULAR | Status: DC | PRN

## 2016-06-28 MED ORDER — DEXAMETHASONE SODIUM PHOSPHATE 10 MG/ML IJ SOLN
INTRAMUSCULAR | Status: DC | PRN
  Administered 2016-06-28: 4 mg via INTRAVENOUS

## 2016-06-28 MED ORDER — OXYCODONE HCL 5 MG/5ML PO SOLN
ORAL | Status: AC
  Filled 2016-06-28: qty 5

## 2016-06-28 MED ORDER — LIDOCAINE HCL (CARDIAC) 20 MG/ML IV SOLN
INTRAVENOUS | Status: DC | PRN
  Administered 2016-06-28: 60 mg via INTRAVENOUS

## 2016-06-28 MED ORDER — HYDRALAZINE HCL 20 MG/ML IJ SOLN: 5.0000 mg | INTRAMUSCULAR | Status: DC | PRN

## 2016-06-28 MED ORDER — 0.9 % SODIUM CHLORIDE (POUR BTL) OPTIME
TOPICAL | Status: DC | PRN
Start: 2016-06-28 — End: 2016-06-28
  Administered 2016-06-28: 3000 mL

## 2016-06-28 MED ORDER — MIDAZOLAM HCL 2 MG/2ML IJ SOLN
0.5000 mg | INTRAMUSCULAR | Status: AC | PRN
  Administered 2016-06-28 (×4): 0.5 mg via INTRAVENOUS

## 2016-06-28 MED ORDER — VANCOMYCIN HCL IN DEXTROSE 1-5 GM/200ML-% IV SOLN
1000.0000 mg | Freq: Two times a day (BID) | INTRAVENOUS | Status: AC
  Administered 2016-06-28 – 2016-06-29 (×2): 1000 mg via INTRAVENOUS
  Filled 2016-06-28 (×2): qty 200

## 2016-06-28 MED ORDER — PROTAMINE SULFATE 10 MG/ML IV SOLN
INTRAVENOUS | Status: DC | PRN
  Administered 2016-06-28 (×5): 10 mg via INTRAVENOUS

## 2016-06-28 MED ORDER — POTASSIUM CHLORIDE CRYS ER 20 MEQ PO TBCR: 20.0000 meq | EXTENDED_RELEASE_TABLET | Freq: Every day | ORAL | Status: DC | PRN

## 2016-06-28 MED ORDER — HYDROMORPHONE HCL 1 MG/ML IJ SOLN
INTRAMUSCULAR | Status: DC | PRN
  Administered 2016-06-28 (×4): .5 mg via INTRAVENOUS

## 2016-06-28 MED ORDER — DOCUSATE SODIUM 50 MG/5ML PO LIQD
100.0000 mg | Freq: Two times a day (BID) | ORAL | Status: DC
  Administered 2016-06-28 – 2016-06-29 (×2): 100 mg via ORAL
  Filled 2016-06-28 (×2): qty 10

## 2016-06-28 MED ORDER — ORAL CARE MOUTH RINSE: 15.0000 mL | Freq: Two times a day (BID) | OROMUCOSAL | Status: DC

## 2016-06-28 MED ORDER — ONDANSETRON HCL 4 MG/2ML IJ SOLN
INTRAMUSCULAR | Status: DC | PRN
  Administered 2016-06-28: 4 mg via INTRAVENOUS

## 2016-06-28 MED ORDER — ACETAMINOPHEN 10 MG/ML IV SOLN
INTRAVENOUS | Status: DC | PRN
  Administered 2016-06-28: 1000 mg via INTRAVENOUS

## 2016-06-28 MED ORDER — PROPOFOL 10 MG/ML IV BOLUS
INTRAVENOUS | Status: AC
  Filled 2016-06-28: qty 20

## 2016-06-28 MED ORDER — METOPROLOL TARTRATE 5 MG/5ML IV SOLN
2.0000 mg | INTRAVENOUS | Status: DC | PRN
Start: 2016-06-28 — End: 2016-07-03

## 2016-06-28 MED ORDER — OXYCODONE HCL 5 MG PO TABS: 5.0000 mg | ORAL_TABLET | Freq: Once | ORAL | Status: AC | PRN

## 2016-06-28 MED ORDER — SUGAMMADEX SODIUM 200 MG/2ML IV SOLN
INTRAVENOUS | Status: AC
  Filled 2016-06-28: qty 2

## 2016-06-28 MED ORDER — FENTANYL CITRATE (PF) 100 MCG/2ML IJ SOLN
INTRAMUSCULAR | Status: AC
  Filled 2016-06-28: qty 2

## 2016-06-28 MED ORDER — MORPHINE SULFATE (PF) 2 MG/ML IV SOLN
2.0000 mg | INTRAVENOUS | Status: DC | PRN
  Administered 2016-06-28 – 2016-06-29 (×4): 4 mg via INTRAVENOUS
  Administered 2016-06-30 (×2): 2 mg via INTRAVENOUS
  Filled 2016-06-28 (×4): qty 2
  Filled 2016-06-28 (×2): qty 1

## 2016-06-28 MED ORDER — MEPERIDINE HCL 25 MG/ML IJ SOLN: 6.2500 mg | INTRAMUSCULAR | Status: DC | PRN

## 2016-06-28 MED ORDER — METOPROLOL TARTRATE 5 MG/5ML IV SOLN
INTRAVENOUS | Status: DC | PRN
  Administered 2016-06-28 (×3): 1 mg via INTRAVENOUS
  Administered 2016-06-28: 2 mg via INTRAVENOUS

## 2016-06-28 MED ORDER — PHENYLEPHRINE 40 MCG/ML (10ML) SYRINGE FOR IV PUSH (FOR BLOOD PRESSURE SUPPORT)
PREFILLED_SYRINGE | INTRAVENOUS | Status: AC
  Filled 2016-06-28: qty 10

## 2016-06-28 MED ORDER — MIDAZOLAM HCL 5 MG/5ML IJ SOLN
INTRAMUSCULAR | Status: DC | PRN
  Administered 2016-06-28 (×2): 2 mg via INTRAVENOUS

## 2016-06-28 MED ORDER — KETAMINE PEDS BOLUS VIA INFUSION
0.5000 mg/kg | INTRAVENOUS | Status: AC
  Administered 2016-06-28: 45.35 mg via INTRAVENOUS
  Filled 2016-06-28: qty 50

## 2016-06-28 MED ORDER — LORAZEPAM 2 MG/ML IJ SOLN
1.0000 mg | INTRAMUSCULAR | Status: DC | PRN
  Administered 2016-06-28 – 2016-07-02 (×6): 1 mg via INTRAVENOUS
  Filled 2016-06-28 (×6): qty 1

## 2016-06-28 MED FILL — Sodium Chloride IV Soln 0.9%: INTRAVENOUS | Qty: 1000 | Status: AC

## 2016-06-28 MED FILL — Heparin Sodium (Porcine) Inj 1000 Unit/ML: INTRAMUSCULAR | Qty: 30 | Status: AC

## 2016-06-28 SURGICAL SUPPLY — 61 items
ADH SKN CLS APL DERMABOND .7 (GAUZE/BANDAGES/DRESSINGS) ×3
CANISTER SUCTION 2500CC (MISCELLANEOUS) ×3 IMPLANT
CANNULA VESSEL 3MM 2 BLNT TIP (CANNULA) ×5 IMPLANT
CLIP TI MEDIUM 24 (CLIP) ×3 IMPLANT
CLIP TI WIDE RED SMALL 24 (CLIP) ×3 IMPLANT
DERMABOND ADVANCED (GAUZE/BANDAGES/DRESSINGS) ×6
DERMABOND ADVANCED .7 DNX12 (GAUZE/BANDAGES/DRESSINGS) IMPLANT
ELECT BLADE 4.0 EZ CLEAN MEGAD (MISCELLANEOUS) ×3
ELECT BLADE 6.5 EXT (BLADE) ×2 IMPLANT
ELECT REM PT RETURN 9FT ADLT (ELECTROSURGICAL) ×6
ELECTRODE BLDE 4.0 EZ CLN MEGD (MISCELLANEOUS) ×1 IMPLANT
ELECTRODE REM PT RTRN 9FT ADLT (ELECTROSURGICAL) ×1 IMPLANT
FELT TEFLON 1X6 (MISCELLANEOUS) ×2 IMPLANT
GLOVE BIO SURGEON STRL SZ7.5 (GLOVE) ×3 IMPLANT
GLOVE BIOGEL PI IND STRL 6.5 (GLOVE) IMPLANT
GLOVE BIOGEL PI IND STRL 7.0 (GLOVE) IMPLANT
GLOVE BIOGEL PI IND STRL 7.5 (GLOVE) IMPLANT
GLOVE BIOGEL PI IND STRL 8 (GLOVE) ×1 IMPLANT
GLOVE BIOGEL PI INDICATOR 6.5 (GLOVE) ×8
GLOVE BIOGEL PI INDICATOR 7.0 (GLOVE) ×2
GLOVE BIOGEL PI INDICATOR 7.5 (GLOVE) ×2
GLOVE BIOGEL PI INDICATOR 8 (GLOVE) ×2
GLOVE ECLIPSE 6.5 STRL STRAW (GLOVE) ×4 IMPLANT
GLOVE ECLIPSE 7.0 STRL STRAW (GLOVE) ×2 IMPLANT
GLOVE SURG SS PI 6.5 STRL IVOR (GLOVE) ×4 IMPLANT
GOWN STRL REUS W/ TWL LRG LVL3 (GOWN DISPOSABLE) ×3 IMPLANT
GOWN STRL REUS W/TWL LRG LVL3 (GOWN DISPOSABLE) ×24
GRAFT HEMASHIELD 14X7 (Vascular Products) ×2 IMPLANT
INSERT FOGARTY 61MM (MISCELLANEOUS) ×3 IMPLANT
INSERT FOGARTY SM (MISCELLANEOUS) ×6 IMPLANT
KIT BASIN OR (CUSTOM PROCEDURE TRAY) ×3 IMPLANT
KIT PREVENA INCISION MGT 13 (CANNISTER) ×4 IMPLANT
KIT ROOM TURNOVER OR (KITS) ×3 IMPLANT
NS IRRIG 1000ML POUR BTL (IV SOLUTION) ×8 IMPLANT
PACK AORTA (CUSTOM PROCEDURE TRAY) ×3 IMPLANT
PAD ARMBOARD 7.5X6 YLW CONV (MISCELLANEOUS) ×6 IMPLANT
RETAINER VISCERA MED (MISCELLANEOUS) ×3 IMPLANT
SPONGE SURGIFOAM ABS GEL 100 (HEMOSTASIS) IMPLANT
SUT ETHIBOND 5 LR DA (SUTURE) IMPLANT
SUT PDS AB 1 TP1 54 (SUTURE) ×6 IMPLANT
SUT PROLENE 2 0 MH 48 (SUTURE) IMPLANT
SUT PROLENE 3 0 SH 48 (SUTURE) IMPLANT
SUT PROLENE 3 0 SH1 36 (SUTURE) IMPLANT
SUT PROLENE 5 0 C 1 24 (SUTURE) ×4 IMPLANT
SUT PROLENE 5 0 C 1 36 (SUTURE) IMPLANT
SUT SILK 2 0 (SUTURE) ×6
SUT SILK 2 0 SH CR/8 (SUTURE) ×3 IMPLANT
SUT SILK 2 0 TIES 17X18 (SUTURE) ×3
SUT SILK 2-0 18XBRD TIE 12 (SUTURE) ×1 IMPLANT
SUT SILK 2-0 18XBRD TIE BLK (SUTURE) ×1 IMPLANT
SUT SILK 3 0 (SUTURE) ×3
SUT SILK 3 0 TIES 17X18 (SUTURE) ×3
SUT SILK 3-0 18XBRD TIE 12 (SUTURE) ×1 IMPLANT
SUT SILK 3-0 18XBRD TIE BLK (SUTURE) ×1 IMPLANT
SUT VIC AB 2-0 CTB1 (SUTURE) ×6 IMPLANT
SUT VIC AB 3-0 SH 27 (SUTURE)
SUT VIC AB 3-0 SH 27X BRD (SUTURE) IMPLANT
SUT VICRYL 4-0 PS2 18IN ABS (SUTURE) IMPLANT
TOWEL BLUE STERILE X RAY DET (MISCELLANEOUS) ×6 IMPLANT
TRAY FOLEY W/METER SILVER 16FR (SET/KITS/TRAYS/PACK) ×3 IMPLANT
WATER STERILE IRR 1000ML POUR (IV SOLUTION) ×6 IMPLANT

## 2016-06-28 NOTE — Anesthesia Procedure Notes (Addendum)
Procedure Name: Intubation Date/Time: 06/28/2016 7:54 AM Performed by: Faustino CongressWHITE, Nailyn Dearinger TENA Jaielle Dlouhy Pre-anesthesia Checklist: Patient identified, Emergency Drugs available, Suction available and Patient being monitored Patient Re-evaluated:Patient Re-evaluated prior to inductionOxygen Delivery Method: Circle System Utilized Preoxygenation: Pre-oxygenation with 100% oxygen Intubation Type: IV induction Ventilation: Mask ventilation with difficulty, Two handed mask ventilation required and Oral airway inserted - appropriate to patient size Laryngoscope Size: Glidescope and 4 Grade View: Grade II Tube type: Oral Tube size: 7.0 mm Number of attempts: 1 Airway Equipment and Method: Stylet and Oral airway Placement Confirmation: ETT inserted through vocal cords under direct vision,  positive ETCO2 and breath sounds checked- equal and bilateral Secured at: 23 cm Tube secured with: Tape Dental Injury: Teeth and Oropharynx as per pre-operative assessment  Comments: Smooth IV induction, difficult mask ventilation with OPA with minimal airway movement achieved, no paralytics given, despite patient in pre-op denying loose dentition it is noted that the front right tooth has significant gum recession and is very loose. DL X1 by SRNA with MAC 3 - grade 3 view, copious thick white secretions, vocal cords closed therefore unable to pass ETT. Pateint back breathing, assisted ventilation between attempts, additional propofol, inhalational agent, and succinylcholine given, VL x2 with Glidescope 4 - grade 2 view, copious think white secretions suctioned, atraumatic intubation with 7.0 ETT. BBS=, +ETCO2, tube secured.

## 2016-06-28 NOTE — Interval H&P Note (Signed)
History and Physical Interval Note:  06/28/2016 7:26 AM  Frances HaggardAngela Dukeman  has presented today for surgery, with the diagnosis of Peripheral vascular disease with right great toe gangrene I70.261  The various methods of treatment have been discussed with the patient and family. After consideration of risks, benefits and other options for treatment, the patient has consented to  Procedure(s): AORTOBIFEMORAL BYPASS GRAFT (Bilateral) as a surgical intervention .  The patient's history has been reviewed, patient examined, no change in status, stable for surgery.  I have reviewed the patient's chart and labs.  Questions were answered to the patient's satisfaction.     Waverly Ferrariickson, Christopher

## 2016-06-28 NOTE — H&P (View-Only) (Signed)
  Progress Note  06/27/2016 8:32 AM Hospital Day 4  Subjective:  Just ready to get her surgery over with and feel better  Afebrile 90's-110's NSR 100's-130's systolic 100% RA  Vitals:   06/27/16 0137 06/27/16 0423  BP: (!) 105/55 (!) 125/58  Pulse: (!) 112 93  Resp:  (!) 22  Temp:  98.2 F (36.8 C)    Physical Exam: Lungs:  Non labored Extremities:  Dark right great toe; no palpable pulses  CBC    Component Value Date/Time   WBC 10.1 06/25/2016 0215   RBC 3.65 (L) 06/25/2016 0215   HGB 11.5 (L) 06/25/2016 0215   HCT 35.2 (L) 06/25/2016 0215   PLT 412 (H) 06/25/2016 0215   MCV 96.4 06/25/2016 0215   MCH 31.5 06/25/2016 0215   MCHC 32.7 06/25/2016 0215   RDW 13.4 06/25/2016 0215   LYMPHSABS 4.4 (H) 06/23/2016 1340   MONOABS 1.4 (H) 06/23/2016 1340   EOSABS 0.2 06/23/2016 1340   BASOSABS 0.0 06/23/2016 1340    BMET    Component Value Date/Time   NA 135 06/26/2016 0242   K 3.7 06/26/2016 0242   CL 103 06/26/2016 0242   CO2 24 06/26/2016 0242   GLUCOSE 126 (H) 06/26/2016 0242   BUN 17 06/26/2016 0242   CREATININE 0.60 06/26/2016 0242   CALCIUM 8.9 06/26/2016 0242   GFRNONAA >60 06/26/2016 0242   GFRAA >60 06/26/2016 0242    INR    Component Value Date/Time   INR 1.08 06/23/2016 1352    No intake or output data in the 24 hours ending 06/27/16 0832   Assessment/Plan:  49 y.o. female with occluded infrarenal aorta Hospital Day 4  -pt has been cleared by cardiology -she had some numbness of there RLE but some better this morning.  Hopefully with surgery, this will help with her pain/numbness -will pre op for aortobifemoral bypass tomorrow by Dr. Dickson -Dr. Dickson to see pt later today  Samantha Rhyne, PA-C [facility] [phone] 06/27/2016 8:32 AM  I have interviewed the patient and examined the patient. I agree with the findings by the PA. I have again discussed the plans to proceed with AFBG tomorrow. She is agreeable.    Chris Dickson, MD [phone]  

## 2016-06-28 NOTE — Progress Notes (Signed)
Pt noted to have pulled out NGT after multiple explanations of its purpose and necessity for post-op management. NGT appeared to be intact. Dr Jettie BoozeField contacted r/t this occurence and advised to leave NGT out for now.

## 2016-06-28 NOTE — Progress Notes (Signed)
Pharmacy Antibiotic Note  Frances Graham is a 50 y.o. female admitted on 06/23/2016.  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  Now s/p Aortobifemoral bypass graft. Pharmacy has been consulted: may adjust antibiotic dose based on patient's renal function.   Vanc 1gm IV x1 given today 10/6 ~ 0800AM, then vanc 1gm IV q12h x 2 doses post op per intra-abd vascular surgery orders Scr 0.62, CrCl ~ 100 ml/min, age 50 Vanc 1gm q12h x2 doses post op- dose okay for surgical prophylaxis.  Plan: Continue Vancomycin 1gm q12h x2 doses for surgical prophylaxis.   Weight: 200 lb (90.7 kg)  Temp (24hrs), Avg:98.2 F (36.8 C), Min:97.5 F (36.4 C), Max:98.6 F (37 C)   Recent Labs Lab 06/23/16 1340 06/24/16 0348 06/25/16 0215 06/26/16 0242 06/28/16 0313 06/28/16 1228 06/28/16 1350  WBC 13.0* 13.1* 10.1  --  11.5*  --  12.5*  CREATININE 1.35* 0.77 0.64 0.60 0.54 0.62  --     Estimated Creatinine Clearance: 100.5 mL/min (by C-G formula based on SCr of 0.62 mg/dL).    Allergies  Allergen Reactions  . Penicillins Anaphylaxis    Has patient had a PCN reaction causing immediate rash, facial/tongue/throat swelling, SOB or lightheadedness with hypotension: Yes Has patient had a PCN reaction causing severe rash involving mucus membranes or skin necrosis: No Has patient had a PCN reaction that required hospitalization Yes Has patient had a PCN reaction occurring within the last 10 years: No If all of the above answers are "NO", then may proceed with Cephalosporin use.   . Benadryl [Diphenhydramine Hcl] Other (See Comments)    syncope  . Codeine     Reflux. Pt states not allergic to hydrocodone and oxycodone .  Marland Kitchen. Other Other (See Comments)    "I can't take anything with "cillins" on the end of it" - Anaphylaxis  . Celebrex [Celecoxib] Nausea And Vomiting and Rash  . Keflex [Cephalexin] Nausea And Vomiting and Rash     Projectile vomiting, thrush, fever blisters, ED visit  . Lidocaine Swelling and Rash    Foot swelled up like a balloon    Antimicrobials this admission: 10/6 Vanc x 2 doses post op Dose adjustments this admission: n/a  Microbiology results: 10/1 BCx: x2 : negative 10/6 MRSA PCR: positive  Thank you for allowing pharmacy to be a part of this patient's care.  Noah Delaineuth Piccola [place], RPh Clinical Pharmacist Pager: [phone] 06/28/2016 6:46 PM

## 2016-06-28 NOTE — Transfer of Care (Signed)
Immediate Anesthesia Transfer of Care Note  Patient: Frances Graham  Procedure(s) Performed: Procedure(s): AORTOBIFEMORAL BYPASS GRAFT (Bilateral)  Patient Location: PACU  Anesthesia Type:General  Level of Consciousness: awake, alert , oriented and patient cooperative  Airway & Oxygen Therapy: Patient Spontanous Breathing and Patient connected to face mask oxygen  Post-op Assessment: Report given to RN and Post -op Vital signs reviewed and stable  Post vital signs: Reviewed and stable  Last Vitals:  Vitals:   06/27/16 2008 06/28/16 0425  BP: 137/78 125/79  Pulse: 100 100  Resp: 18 19  Temp: 36.9 C 37 C    Last Pain:  Vitals:   06/28/16 0429  TempSrc:   PainSc: 8       Patients Stated Pain Goal: 0 (06/27/16 0851)  Complications: No apparent anesthesia complications

## 2016-06-28 NOTE — Anesthesia Postprocedure Evaluation (Signed)
Anesthesia Post Note  Patient: Frances Graham  Procedure(s) Performed: Procedure(s) (LRB): AORTOBIFEMORAL BYPASS GRAFT (Bilateral)  Patient location during evaluation: PACU Anesthesia Type: General Level of consciousness: awake and alert Pain management: pain level controlled Vital Signs Assessment: post-procedure vital signs reviewed and stable Respiratory status: spontaneous breathing, nonlabored ventilation, respiratory function stable and patient connected to nasal cannula oxygen Cardiovascular status: blood pressure returned to baseline and stable Postop Assessment: no signs of nausea or vomiting Anesthetic complications: no    Last Vitals:  Vitals:   06/28/16 1330 06/28/16 1345  BP:  120/73  Pulse: (!) 102 (!) 101  Resp: 17 18  Temp:      Last Pain:  Vitals:   06/28/16 0429  TempSrc:   PainSc: 8                  Ether Wolters,W. EDMOND

## 2016-06-28 NOTE — Anesthesia Procedure Notes (Signed)
Anesthesia Procedure Image      RIJ vein for Swan insertion

## 2016-06-28 NOTE — Progress Notes (Signed)
   VASCULAR SURGERY POSTOP NOTE:  Pain adequately controlled.   Stable post op.  SUBJECTIVE: Moderate discomfort.  PHYSICAL EXAM: Vitals:   06/28/16 1345 06/28/16 1400 06/28/16 1415 06/28/16 1430  BP: 120/73 116/64 133/68   Pulse: (!) 101 100 100 99  Resp: 18 17 14 16   Temp:      TempSrc:      SpO2: 100% 100% 100% 100%  Weight:       Brisk doppler signals DP and PT bilat.   LABS: Lab Results  Component Value Date   WBC 12.5 (H) 06/28/2016   HGB 9.4 (L) 06/28/2016   HCT 29.1 (L) 06/28/2016   MCV 96.4 06/28/2016   PLT 295 06/28/2016   Lab Results  Component Value Date   CREATININE 0.62 06/28/2016   Lab Results  Component Value Date   INR 1.15 06/28/2016   Principal Problem:   Aortoiliac occlusive disease (HCC) Active Problems:   Peripheral vascular disease (HCC)   Acute kidney injury (HCC)   Essential hypertension   Ischemic ulcer of foot (HCC)   Cardiovascular risk factor   Leukocytosis   Tobacco abuse   Chronic low back pain   ADD (attention deficit disorder)   Pre-operative clearance   Mixed hyperlipidemia  Cari CarawayChris Ravynn Hogate Beeper: [phone] 06/28/2016

## 2016-06-28 NOTE — Op Note (Signed)
NAME: Frances Graham   MRN: [id number] DOB: [birth year]    DATE OF OPERATION: 06/28/2016  PREOP DIAGNOSIS: Leriche's syndrome with infrarenal aortic occlusion  POSTOP DIAGNOSIS: Same  PROCEDURE: Aortobifemoral bypass graft (14 x 7 mm Dacron graft)  SURGEON: Di Kindle. Edilia Bo, MD, FACS  ASSIST: Lianne Cure PA  ANESTHESIA: Gen.   EBL: 200 cc  INDICATIONS: Frances Graham is a 50 y.o. female who presented with a chronic aortic occlusion but progressive ischemic changes to both feet. It was felt that she had limb threatening ischemia bilaterally and her only option for limb salvage was attempted revascularization. She underwent preoperative cardiac clearance.  FINDINGS: Good Doppler signals in both feet at the completion of the procedure.  TECHNIQUE: The patient was taken to the operating room after monitoring lines were placed by anesthesia. The abdomen and both groins were prepped and draped in usual sterile fashion after she received a general anesthetic. An oblique incision was made in the left groin above the inguinal crease. This was done in order to facilitate wound healing given her body habitus and obesity. The dissection was carried down to the common femoral artery which was somewhat small but soft. The deep femoral, superficial femoral, and common femoral arteries were controlled with vessel loops. A retroperitoneal tunnel was started.  Attention was then turned to the right groin. Again an oblique incision was made above the inguinal crease and dissection carried down to the common femoral artery. The artery was soft. A retroperitoneal tunnel was started on the right side. The wounds were packed with moist gauze.  Next, the abdomen was entered through a midline incision. Upon careful expiration no other intra-abdominal pathology was noted. The NG tube was in good position. The transverse colon was reflected superiorly and the small bowel reflected to the patient's  right. The retroperitoneal tissue was divided and the aorta exposed up to the level of the renal vein. There was an inferior left renal artery and this was identified and it looked like I could place a clamp just below this. There was some large posterior veins which were ligated where they entered the inferior vena cava. The inferior mesenteric artery was controlled with a vessel loop. The patient was then heparinized.  A 14 x 7 mm Dacron graft was selected. The infrarenal aorta was clamped just below the accessory left renal artery. The aorta was clamped just above the IMA. A segment of aorta was excised. The distal aspect was oversewn with running 3-0 Prolene suture. The clamp was released and there was good hemostasis. Proximally there was some organized clot which was endarterectomized. A 14 x 7 mm Dacron graft was selected. This was cut the appropriate length and then sewn end to end to the infrarenal aorta using a felt cuff with running 3-0 Prolene suture. At the completion the anastomosis was tested and was hemostatic. Each of the limbs of the graft within pulled down to the previously created tunnels for anastomosis to the femoral arteries. On the right side, the common femoral artery was clamped proximally and distally and a longitudinal arteriotomy was made. The right limb of the graft was the appropriate length, spatulated, and sewn end-to-side to the common femoral artery using continuous 5-0 Prolene suture. Prior to completing this anastomosis the artery was backbled and flushed appropriately and the anastomosis completed. Flow was reestablished to the right leg which the patient tolerated from a hemodynamic standpoint.   Attention was then turned to the left groin. The common femoral,  superficial femoral, and deep femoral arteries were controlled. The left limb of the graft was cut the appropriate length, spatulated, and sewn end-to-side to the left common femoral artery using continuous 5-0  Prolene suture. Prior to completing this anastomosis the artery was backbled and flushed properly and the anastomosis completed. Flow was reestablished to the left leg which the patient tolerated from urinary standpoint. Next the heparin was partially reversed with protamine. Both groins were irrigated and hemostasis obtained.  The left groin incision was closed with a deep layer of 2-0 Vicryl. The subcutaneous layer was closed with 3-0 Vicryl and the skin closed with 4-0 Vicryl. Likewise, on the right side, the fascial layer over the artery was close the deeper 2-0 Vicryl. The subcutaneous layer was closed with 3-0 Vicryl and the skin closed with 4-0 Vicryl.  Attention was then turned to the abdomen. Hemostasis was obtained and the wound was irrigated. The colon appeared well-perfused. The ridge perineal tissue was closed with running 2-0 Vicryl. The small bowel was run in the abdominal contents returned to their normal position. The omentum was returned to its normal position in the fascial layer was closed with 2 running #1 PDS sutures. The abdominal wound which was quite deep was then closed with 2 deep layers of 2-0 Vicryl, a subcutaneous layer of 3-0 Vicryl and the skin closure staples. At the completion was excellent Doppler flow in both feet. The patient tolerated the procedure well and was transferred to the recovery room in stable condition. All needle and sponge counts were correct.    Waverly Ferrarihristopher Brocha Gilliam, MD, FACS [facility]  DATE OF DICTATION:   06/28/2016

## 2016-06-28 NOTE — Progress Notes (Signed)
PROGRESS NOTE  Frances Graham  CHE:[id number] DOB: 1966/05/13  DOA: 06/23/2016 PCP: Frances Downing, MD   Brief Narrative:  50 y.o.femalewith a Past Medical History of anxiety, ADHD, hypertension, TMJ and peripheral arterial disease who presents with nnonhealing toe ulcer. Presents complaining of back pain, shooting pain in her legs. She also has reported that she has also developed intermittent weakness in both of her legs and episodes of incontinence. She returns to the ER today complaining of more sharp pain in the right leg that worsens with activity and with increase in the discoloration.  CT angiogram of the lower extremities which reveals a chronic infrarenal aortic occlusion with reconstituted bilateral external iliac arteries and intact bilateral femoropopliteal perfusion with three-vessel runoff on both sides  Status post aortobifemoral bypass graft.   Assessment & Plan:   Principal Problem:   Aortoiliac occlusive disease (HCC) Active Problems:   Peripheral vascular disease ([place])   Acute kidney injury ([place])   Essential hypertension   Ischemic ulcer of foot ([place])   Cardiovascular risk factor   Leukocytosis   Tobacco abuse   Chronic low back pain   ADD (attention deficit disorder)   Pre-operative clearance   Mixed hyperlipidemia   Aortoiliac occlusive disease/Peripheral vascular disease  - Risk factors for PVD include:ongoing tobacco abuse and hypertension. LDL 126. -Evaluated by cardiology for pre op clearance . Myoview stress test low risk, wall motion abnormalities. As per cardiology follow-up 10/4: EKG nonspecific. Myoview low risk without reversible ischemia, abnormal wall motion >likely due to gut uptake, echo showed LVEF 60-65 percent without wall motion abnormality. She has been cleared for surgery. - S/P aorto bifemoral bypass on 06/28/16. Patient was seen in PACU and will be going to surgical ICU. Management per vascular surgery.  Acute kidney injury  ; While on ACE inhibitor with a thiazide diuretic and current renal function is 41/1.35 on 10/1 Hold ACE inhibitor and thiazide diuretic. On admission cr 1.5-- trending down.  Resolved, cr at 0.60.   Ischemic ulcer of foot/Leukocytosis -No erythema or drainage;no issues with fevers -Ulcers appear consistent with dry gangrene and are quite localized -ESR; 54 -follow WBC trend > leukocytosis resolved. Status post surgery as above. Monitor for improvement.  HLD;  - started statins and fenofibrate. Will need repeat LFT and lipid panel in 6 weeks. As per cardiology, she would also benefit from omega 3 FFA (lovaza or vascepa 2 cap bid) but can start at dc  Chronic low back pain -Patient reports multiple years of chronic low back pain, shooting pain in her legs -Continue preadmission Neurontin and Robaxin -MRI with Mild canal stenosis suspected at L3-4 and L4-5. Mild to moderate L5-S1 neural foraminal narrowing. -Dr. Tyrell Graham discussed MRI finding with neurosurgery Dr Frances Graham, finding not impressive. Mild degeneration disk diseases. No surgical intervention needed for these finding. Frances Graham likely would explain patient legs pain.   HTN;  -started low dose norvasc and on metoprolol. Better controlled.   Tobacco abuse -Patient reports smoking 5 cigarettes per day and has been smoking for greater than 25 years -Cessation counseling initiated -Nicotine patch  Attention deficit disorder -Continue Vyvanse  Low TSH, normal Free T 3 and Free T 4;  - needs repeat labs in 4 weeks.   Mild sinus tachycardia - Continue beta blockers. Thyroid test results as above. Resolved.  Anemia - Follow CBCs. Hemoglobin has dropped from 11.5 a couple days ago to 9.8 preoperatively. Follow CBC in a.m.  DVT prophylaxis: Lovenox-was on this preoperatively. Postop will defer to  vascular surgery. Code Status: Full Family Communication: None at bedside Disposition Plan: DC when medically improved post  op.   Consultants:   Vascular Surgery  Cardiology  Procedures:   Status post aorto bifemoral bypass 06/28/16  Antimicrobials:   None    Subjective: Patient was seen in the PACU postoperatively. She was in obvious surgical pain but denied any other complaints. As per nursing, did well operatively and will be going to surgical ICU.  Objective:  Vitals:   06/27/16 0423 06/27/16 1610 06/27/16 2008 06/28/16 0425  BP: (!) 125/58 (!) 178/80 137/78 125/79  Pulse: 93 98 100 100  Resp: (!) '22  18 19  '$ Temp: 98.2 F (36.8 C)  98.5 F (36.9 C) 98.6 F (37 C)  TempSrc: Oral  Oral Oral  SpO2: 100% 100% 96% 100%    Intake/Output Summary (Last 24 hours) at 06/28/16 0744 Last data filed at 06/28/16 0424  Gross per 24 hour  Intake              575 ml  Output              200 ml  Net              375 ml   There were no vitals filed for this visit.  Examination:  General exam: Pleasant young female Lying comfortably on the gurney. Respiratory system: Clear to auscultation. Respiratory effort normal. Cardiovascular system: S1 & S2 heard, RRR. No JVD, murmurs, rubs, gallops or clicks. No pedal edema.  Gastrointestinal system: Abdomen is nondistended, soft and nontender. No organomegaly or masses felt. Normal bowel sounds heard. Surgical site intact. Central nervous system: Alert and oriented. No focal neurological deficits. Extremities: Symmetric 5 x 5 power. 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 Ischemic changes to multiple toes on both feet.Diminished dorsalis pedis and posterior tibial pulses bilaterally and right leg/foot feels cooler than left. Skin: No rashes, lesions or ulcers Psychiatry: Judgement and insight appear normal. Mood & affect anxious.     Data Reviewed: I have personally reviewed following labs and imaging studies  CBC:  Recent Labs Lab 06/23/16 1340 06/24/16 0348 06/25/16 0215 06/28/16 0313  WBC 13.0* 13.1* 10.1 11.5*  NEUTROABS 7.0  --   --   --    HGB 12.7 12.1 11.5* 9.8*  HCT 38.2 36.9 35.2* 30.9*  MCV 95.7 94.9 96.4 97.5  PLT 412* 382 412* 888*   Basic Metabolic Panel:  Recent Labs Lab 06/23/16 1340 06/24/16 0348 06/25/16 0215 06/26/16 0242 06/28/16 0313  NA 136 136 137 135 137  K 4.0 3.4* 3.4* 3.7 3.6  CL 102 105 103 103 102  CO2 16* 19* '23 24 25  '$ GLUCOSE 97 118* 115* 126* 101*  BUN 41* 31* '19 17 9  '$ CREATININE 1.35* 0.77 0.64 0.60 0.54  CALCIUM 10.3 9.6 9.2 8.9 9.0   GFR: CrCl cannot be calculated (Unknown ideal weight.). Liver Function Tests:  Recent Labs Lab 06/24/16 0348  AST 25  ALT 26  ALKPHOS 74  BILITOT 0.6  PROT 7.0  ALBUMIN 3.4*   No results for input(s): LIPASE, AMYLASE in the last 168 hours. No results for input(s): AMMONIA in the last 168 hours. Coagulation Profile:  Recent Labs Lab 06/23/16 1352  INR 1.08   Cardiac Enzymes: No results for input(s): CKTOTAL, CKMB, CKMBINDEX, TROPONINI in the last 168 hours. BNP (last 3 results) No results for input(s): PROBNP in the last 8760 hours. HbA1C: No results for input(s): HGBA1C in the  last 72 hours. CBG: No results for input(s): GLUCAP in the last 168 hours. Lipid Profile: No results for input(s): CHOL, HDL, LDLCALC, TRIG, CHOLHDL, LDLDIRECT in the last 72 hours. Thyroid Function Tests: No results for input(s): TSH, T4TOTAL, FREET4, T3FREE, THYROIDAB in the last 72 hours. Anemia Panel: No results for input(s): VITAMINB12, FOLATE, FERRITIN, TIBC, IRON, RETICCTPCT in the last 72 hours.  Sepsis Labs: No results for input(s): PROCALCITON, LATICACIDVEN in the last 168 hours.  Recent Results (from the past 240 hour(s))  Culture, blood (Routine X 2) w Reflex to ID Panel     Status: None (Preliminary result)   Collection Time: 06/23/16  6:56 PM  Result Value Ref Range Status   Specimen Description BLOOD RIGHT ANTECUBITAL  Final   Special Requests BOTTLES DRAWN AEROBIC AND ANAEROBIC 6CC  Final   Culture NO GROWTH 4 DAYS  Final   Report  Status PENDING  Incomplete  Culture, blood (Routine X 2) w Reflex to ID Panel     Status: None (Preliminary result)   Collection Time: 06/23/16  7:00 PM  Result Value Ref Range Status   Specimen Description BLOOD LEFT HAND  Final   Special Requests BOTTLES DRAWN AEROBIC AND ANAEROBIC 5CC  Final   Culture NO GROWTH 4 DAYS  Final   Report Status PENDING  Incomplete  Surgical pcr screen     Status: Abnormal   Collection Time: 06/28/16  1:49 AM  Result Value Ref Range Status   MRSA, PCR NEGATIVE NEGATIVE Final   Staphylococcus aureus POSITIVE (A) NEGATIVE Final    Comment:        The Xpert SA Assay (FDA approved for NASAL specimens in patients over 76 years of age), is one component of a comprehensive surveillance program.  Test performance has been validated by [facility] for patients greater than or equal to 48 year old. It is not intended to diagnose infection nor to guide or monitor treatment.          Radiology Studies: No results found.      Scheduled Meds: . [MAR Hold] amLODipine  2.5 mg Oral Daily  . [MAR Hold] atorvastatin  40 mg Oral q1800  . [MAR Hold] docusate sodium  100 mg Oral BID  . [MAR Hold] enoxaparin (LOVENOX) injection  40 mg Subcutaneous Q24H  . [MAR Hold] fenofibrate  160 mg Oral Daily  . [MAR Hold] gabapentin  600 mg Oral TID  . [MAR Hold] methocarbamol  500 mg Oral TID  . [MAR Hold] metoprolol tartrate  25 mg Oral BID  . [MAR Hold] nicotine  7 mg Transdermal Daily  . [MAR Hold] sodium chloride flush  3 mL Intravenous Q12H  . [MAR Hold] vancomycin  1,000 mg Intravenous To SS-Surg   Continuous Infusions:     LOS: 5 days      [facility], MD [facility] Pager [phone]  If 7PM-7AM, please contact night-coverage [email or web address] Password [email or web address] 06/28/2016, 7:44 AM

## 2016-06-29 ENCOUNTER — Inpatient Hospital Stay (HOSPITAL_COMMUNITY): Payer: Medicaid Other

## 2016-06-29 LAB — COMPREHENSIVE METABOLIC PANEL
ALT: 33 U/L (ref 14–54)
AST: 62 U/L — ABNORMAL HIGH (ref 15–41)
Albumin: 2.8 g/dL — ABNORMAL LOW (ref 3.5–5.0)
Alkaline Phosphatase: 68 U/L (ref 38–126)
Anion gap: 9 (ref 5–15)
BILIRUBIN TOTAL: 0.7 mg/dL (ref 0.3–1.2)
BUN: 6 mg/dL (ref 6–20)
CHLORIDE: 102 mmol/L (ref 101–111)
CO2: 28 mmol/L (ref 22–32)
CREATININE: 0.67 mg/dL (ref 0.44–1.00)
Calcium: 8.3 mg/dL — ABNORMAL LOW (ref 8.9–10.3)
GFR calc Af Amer: >60 mL/min (ref >60)
GLUCOSE: 109 mg/dL — AB (ref 65–99)
Potassium: 3.2 mmol/L — ABNORMAL LOW (ref 3.5–5.1)
Sodium: 139 mmol/L (ref 135–145)
TOTAL PROTEIN: 6 g/dL — AB (ref 6.5–8.1)

## 2016-06-29 LAB — CBC
HEMATOCRIT: 30.9 % — AB (ref 36.0–46.0)
Hemoglobin: 9.7 g/dL — ABNORMAL LOW (ref 12.0–15.0)
MCH: 30.7 pg (ref 26.0–34.0)
MCHC: 31.4 g/dL (ref 30.0–36.0)
MCV: 97.8 fL (ref 78.0–100.0)
Platelets: 272 10*3/uL (ref 150–400)
RBC: 3.16 MIL/uL — AB (ref 3.87–5.11)
RDW: 13.3 % (ref 11.5–15.5)
WBC: 11.9 10*3/uL — AB (ref 4.0–10.5)

## 2016-06-29 LAB — AMYLASE: Amylase: 20 U/L — ABNORMAL LOW (ref 28–100)

## 2016-06-29 LAB — MAGNESIUM: MAGNESIUM: 1.7 mg/dL (ref 1.7–2.4)

## 2016-06-29 MED ORDER — CHLORHEXIDINE GLUCONATE CLOTH 2 % EX PADS
6.0000 | MEDICATED_PAD | Freq: Every day | CUTANEOUS | Status: DC
  Administered 2016-06-29 – 2016-07-02 (×3): 6 via TOPICAL

## 2016-06-29 MED ORDER — POTASSIUM CHLORIDE IN NACL 20-0.45 MEQ/L-% IV SOLN
INTRAVENOUS | Status: DC
  Administered 2016-06-29 – 2016-07-02 (×7): via INTRAVENOUS
  Filled 2016-06-29 (×12): qty 1000

## 2016-06-29 NOTE — Progress Notes (Addendum)
[facility] [place]  Subjective  - Confused over night pulled IV and NG out.    Objective 114/60 (!) 112 99.5 F (37.5 C) (Oral) (!) 28 97%  Intake/Output Summary (Last 24 hours) at 06/29/16 0855 Last data filed at 06/29/16 0700  Gross per 24 hour  Intake           6122.5 ml  Output             1960 ml  Net           4162.5 ml    Alert and oriented this am x 3 Incisional Wound vac in place Palpable DP pulses B Abdomin soft, incision intact without drainage, no BS Heart S tach 110 bpm Lungs non labored breathing   Assessment/Planning: POD # 1  Aortobifemoral bypass graft (14 x 7 mm Dacron graft) Will maintain NPO Replace K with IV fluids HGB stable 9.7 Cr stable 0.67, UO 1960 cc last 24 hours Transfer to 2w   Thomasena EdisCOLLINS, EMMA [facility] 06/29/2016 8:55 AM -- Feet warm bilaterally dry gangrene right first toe Some mild confusion A and O to person place but not really time or situation Agree with transfer to 2w as may help confusion Replete hypokalemia Keep NPO for now while confused and minimal bowel function  Fabienne Brunsharles Destry Bezdek, MD [facility] [place] Office: [phone] Pager: [phone]  Laboratory Lab Results:  Recent Labs  06/28/16 1350 06/29/16 0508  WBC 12.5* 11.9*  HGB 9.4* 9.7*  HCT 29.1* 30.9*  PLT 295 272   BMET  Recent Labs  06/28/16 1228 06/29/16 0508  NA 136 139  K 3.5 3.2*  CL 102 102  CO2 24 28  GLUCOSE 131* 109*  BUN 10 6  CREATININE 0.62 0.67  CALCIUM 8.5* 8.3*    COAG Lab Results  Component Value Date   INR 1.15 06/28/2016   INR 1.08 06/23/2016   INR 1.00 06/18/2016   No results found for: PTT

## 2016-06-29 NOTE — Evaluation (Signed)
Physical Therapy Evaluation Patient Details Name: Frances Graham MRN: [id number] DOB: [birth year] Today's Date: 06/29/2016   History of Present Illness  50 yo admitted with aortoiliac occlusive dz s/p aortobifem BPG. PMhx: PVD, AKI, HTN, ADD  Clinical Impression  Pt pleasant but confused and distracted throughout session. Pt aware of surgery but not specifics, disoriented to place and difficulty following more than single step commands. Pt with decreased cognition, mobility, balance and strength who will benefit from acute therapy to maximize independence and function to decrease burden of care.   HR 122 sats 93% on RA    Follow Up Recommendations Home health PT;Supervision/Assistance - 24 hour    Equipment Recommendations  Rolling walker with 5" wheels    Recommendations for Other Services OT consult     Precautions / Restrictions Precautions Precautions: Fall Precaution Comments: bil groin wound VAC      Mobility  Bed Mobility Overal bed mobility: Needs Assistance Bed Mobility: Supine to Sit     Supine to sit: Min assist     General bed mobility comments: cues for sequence with increased time and assist to manage lines and scoot to EOB  Transfers Overall transfer level: Needs assistance   Transfers: Sit to/from Stand Sit to Stand: Min assist         General transfer comment: cues for hand placement, sequence and safety  Ambulation/Gait Ambulation/Gait assistance: Min assist Ambulation Distance (Feet): 150 Feet Assistive device: Rolling walker (2 wheeled) Gait Pattern/deviations: Step-through pattern;Decreased stride length;Trunk flexed   Gait velocity interpretation: Below normal speed for age/gender General Gait Details: mod cues for posture, position in RW and safety due to pt easily distracted and not attending to task  Stairs            Wheelchair Mobility    Modified Rankin (Stroke Patients Only)       Balance Overall balance  assessment: Needs assistance   Sitting balance-Leahy Scale: Fair       Standing balance-Leahy Scale: Poor                               Pertinent Vitals/Pain Pain Assessment: Faces Faces Pain Scale: Hurts little more Pain Location: pt reports pain in left groin unable to rate or describe Pain Intervention(s): Limited activity within patient's tolerance;Monitored during session;Premedicated before session;Repositioned    Home Living Family/patient expects to be discharged to:: Private residence Living Arrangements: Children Available Help at Discharge: Family Type of Home: House       Home Layout: One level Home Equipment: None      Prior Function Level of Independence: Independent         Comments: pt confused and unsure how accurate pt report of home and PLOF are. Pt reports 18yo daughter at home and that she was independent     Hand Dominance        Extremity/Trunk Assessment   Upper Extremity Assessment: Generalized weakness           Lower Extremity Assessment: Generalized weakness      Cervical / Trunk Assessment: Normal  Communication   Communication: No difficulties  Cognition Arousal/Alertness: Awake/alert Behavior During Therapy: Restless Overall Cognitive Status: Impaired/Different from baseline Area of Impairment: Orientation;Memory;Following commands;Safety/judgement;Attention Orientation Level: Disoriented to;Place Current Attention Level: Sustained Memory: Decreased short-term memory Following Commands: Follows one step commands consistently Safety/Judgement: Decreased awareness of deficits;Decreased awareness of safety     General Comments: Pt internally and externally distracted  throughout, pt with tangential conversation    General Comments      Exercises General Exercises - Lower Extremity Long Arc Quad: AROM;Both;10 reps;Seated Hip Flexion/Marching: AROM;Seated;Both;10 reps   Assessment/Plan    PT  Assessment Patient needs continued PT services  PT Problem List Decreased strength;Decreased safety awareness;Decreased mobility;Decreased activity tolerance;Decreased cognition;Decreased knowledge of use of DME;Decreased balance;Pain;Decreased skin integrity          PT Treatment Interventions DME instruction;Gait training;Functional mobility training;Therapeutic exercise;Therapeutic activities;Patient/family education;Stair training    PT Goals (Current goals can be found in the Care Plan section)  Acute Rehab PT Goals Patient Stated Goal: return to daughter PT Goal Formulation: With patient Time For Goal Achievement: 07/13/16 Potential to Achieve Goals: Good    Frequency Min 3X/week   Barriers to discharge Decreased caregiver support unclear what support is available due to pt confusion    Co-evaluation               End of Session Equipment Utilized During Treatment: Gait belt Activity Tolerance: Patient tolerated treatment well Patient left: in chair;with call bell/phone within reach;with chair alarm set;with restraints reapplied Nurse Communication: Mobility status         Time: 8119-1478 PT Time Calculation (min) (ACUTE ONLY): 24 min   Charges:   PT Evaluation $PT Eval Moderate Complexity: 1 Procedure     PT G CodesDelorse Lek 06/29/2016, 12:32 PM Delaney Meigs, PT [phone]

## 2016-06-29 NOTE — Progress Notes (Signed)
PROGRESS NOTE  Frances Graham  DBZ:[id number] DOB: 1966/06/18  DOA: 06/23/2016 PCP: Leonard Downing, MD   Brief Narrative:  50 y.o.femalewith a Past Medical History of anxiety, ADHD, hypertension, TMJ and peripheral arterial disease who presents with nnonhealing toe ulcer. Presents complaining of back pain, shooting pain in her legs. She also has reported that she has also developed intermittent weakness in both of her legs and episodes of incontinence. She returns to the ER today complaining of more sharp pain in the right leg that worsens with activity and with increase in the discoloration.  CT angiogram of the lower extremities which reveals a chronic infrarenal aortic occlusion with reconstituted bilateral external iliac arteries and intact bilateral femoropopliteal perfusion with three-vessel runoff on both sides  Status post aortobifemoral bypass graft. She was transferred to ICU under the primary care of Dr. Joylene Igo. TRH signed off 06/29/16. Please reconsult as needed.   Assessment & Plan:   Principal Problem:   Aortoiliac occlusive disease (HCC) Active Problems:   Peripheral vascular disease ([place])   Acute kidney injury ([place])   Essential hypertension   Ischemic ulcer of foot ([place])   Cardiovascular risk factor   Leukocytosis   Tobacco abuse   Chronic low back pain   ADD (attention deficit disorder)   Pre-operative clearance   Mixed hyperlipidemia   Aortoiliac occlusive disease/Peripheral vascular disease  - Risk factors for PVD include:ongoing tobacco abuse and hypertension. LDL 126. -Evaluated by cardiology for pre op clearance . Myoview stress test low risk, wall motion abnormalities. As per cardiology follow-up 10/4: EKG nonspecific. Myoview low risk without reversible ischemia, abnormal wall motion >likely due to gut uptake, echo showed LVEF 60-65 percent without wall motion abnormality. She has been cleared for surgery. - S/P aorto bifemoral bypass on  06/28/16. Management per vascular surgery. - Discussed with Dr. Oneida Alar on 10/7 and TRH will sign off. Please call us for further assistance.  Acute kidney injury ; While on ACE inhibitor with a thiazide diuretic and current renal function is 41/1.35 on 10/1 Hold ACE inhibitor and thiazide diuretic. On admission cr 1.5-- trending down.  Resolved, cr at 0.60.   Ischemic ulcer of foot/Leukocytosis -No erythema or drainage;no issues with fevers -Ulcers appear consistent with dry gangrene and are quite localized -ESR; 54 -follow WBC trend > leukocytosis resolved. Status post surgery as above. Monitor for improvement.  HLD;  - started statins and fenofibrate. Will need repeat LFT and lipid panel in 6 weeks. As per cardiology, she would also benefit from omega 3 FFA (lovaza or vascepa 2 cap bid) but can start at dc  Chronic low back pain -Patient reports multiple years of chronic low back pain, shooting pain in her legs -Continue preadmission Neurontin and Robaxin -MRI with Mild canal stenosis suspected at L3-4 and L4-5. Mild to moderate L5-S1 neural foraminal narrowing. -Dr. Tyrell Antonio discussed MRI finding with neurosurgery Dr Cyndy Freeze, finding not impressive. Mild degeneration disk diseases. No surgical intervention needed for these finding. [name] likely would explain patient legs pain.   HTN;  -started low dose norvasc and on metoprolol. Better controlled.   Tobacco abuse -Patient reports smoking 5 cigarettes per day and has been smoking for greater than 25 years -Cessation counseling initiated -Nicotine patch  Attention deficit disorder -Continue Vyvanse  Low TSH, normal Free T 3 and Free T 4;  - needs repeat labs in 4 weeks.   Mild sinus tachycardia - Continue beta blockers. Thyroid test results as above. Resolved.  Anemia - Follow CBCs.  Hemoglobin has dropped from 11.5 a couple days ago to 9.8 preoperatively. Stable.  Hypokalemia - replace and follow.    DVT  prophylaxis: Lovenox Code Status: Full Family Communication: None at bedside Disposition Plan: DC when medically improved post op.   Consultants:   Vascular Surgery  Cardiology  Procedures:   Status post aorto bifemoral bypass 06/28/16  Antimicrobials:   None    Subjective: Patient complained of appropriate postop pain. No new complaints.  Objective:  Vitals:   06/29/16 1136 06/29/16 1200 06/29/16 1225 06/29/16 1415  BP:    140/70  Pulse:  (!) 113 (!) 122 (!) 107  Resp:  (!) 24  18  Temp: 98.4 F (36.9 C)   98.7 F (37.1 C)  TempSrc: Oral   Oral  SpO2:  92%  96%  Weight:        Intake/Output Summary (Last 24 hours) at 06/29/16 1804 Last data filed at 06/29/16 1200  Gross per 24 hour  Intake           2582.5 ml  Output              950 ml  Net           1632.5 ml   Filed Weights   06/28/16 0800  Weight: 90.7 kg (200 lb)    Examination:  General exam: Pleasant young female Lying comfortably in bed Respiratory system: Clear to auscultation. Respiratory effort normal. Cardiovascular system: S1 & S2 heard, RRR. No JVD, murmurs, rubs, gallops or clicks. No pedal edema.  Gastrointestinal system: Abdomen is nondistended, soft and nontender. No organomegaly or masses felt. Normal bowel sounds heard. Laparotomy surgical site intact. Bilateral groin wound vacs. Central nervous system: Alert and oriented. No focal neurological deficits. Extremities: Symmetric 5 x 5 power. 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 Ischemic changes to multiple toes on both feet.Diminished dorsalis pedis and posterior tibial pulses bilaterally and right leg/foot feels cooler than left. Skin: No rashes, lesions or ulcers Psychiatry: Judgement and insight appear normal. Mood & affect anxious.     Data Reviewed: I have personally reviewed following labs and imaging studies  CBC:  Recent Labs Lab 06/23/16 1340 06/24/16 0348 06/25/16 0215 06/28/16 0313 06/28/16 0815 06/28/16 0822  06/28/16 1114 06/28/16 1350 06/29/16 0508  WBC 13.0* 13.1* 10.1 11.5*  --   --   --  12.5* 11.9*  NEUTROABS 7.0  --   --   --   --   --   --   --   --   HGB 12.7 12.1 11.5* 9.8* 8.5* 8.5* 8.2* 9.4* 9.7*  HCT 38.2 36.9 35.2* 30.9* 25.0* 25.0* 24.0* 29.1* 30.9*  MCV 95.7 94.9 96.4 97.5  --   --   --  96.4 97.8  PLT 412* 382 412* 444*  --   --   --  295 643   Basic Metabolic Panel:  Recent Labs Lab 06/25/16 0215 06/26/16 0242 06/28/16 0313 06/28/16 0815 06/28/16 0822 06/28/16 1114 06/28/16 1228 06/29/16 0508  NA 137 135 137 137 138 137 136 139  K 3.4* 3.7 3.6 3.4* 3.4* 3.5 3.5 3.2*  CL 103 103 102  --   --   --  102 102  CO2 _0 --   --   --  24 28  GLUCOSE 115* 126* 101*  --  112*  --  131* 109*  BUN _1 --   --   --  10 6  CREATININE 0.64 0.60 0.54  --   --   --  0.62 0.67  CALCIUM 9.2 8.9 9.0  --   --   --  8.5* 8.3*  MG  --   --   --   --   --   --  1.6* 1.7   GFR: Estimated Creatinine Clearance: 100.5 mL/min (by C-G formula based on SCr of 0.67 mg/dL). Liver Function Tests:  Recent Labs Lab 06/24/16 0348 06/29/16 0508  AST 25 62*  ALT 26 33  ALKPHOS 74 68  BILITOT 0.6 0.7  PROT 7.0 6.0*  ALBUMIN 3.4* 2.8*    Recent Labs Lab 06/29/16 0508  AMYLASE 20*   No results for input(s): AMMONIA in the last 168 hours. Coagulation Profile:  Recent Labs Lab 06/23/16 1352 06/28/16 1228  INR 1.08 1.15   Cardiac Enzymes: No results for input(s): CKTOTAL, CKMB, CKMBINDEX, TROPONINI in the last 168 hours. BNP (last 3 results) No results for input(s): PROBNP in the last 8760 hours. HbA1C: No results for input(s): HGBA1C in the last 72 hours. CBG: No results for input(s): GLUCAP in the last 168 hours. Lipid Profile: No results for input(s): CHOL, HDL, LDLCALC, TRIG, CHOLHDL, LDLDIRECT in the last 72 hours. Thyroid Function Tests: No results for input(s): TSH, T4TOTAL, FREET4, T3FREE, THYROIDAB in the last 72 hours. Anemia Panel: No results for  input(s): VITAMINB12, FOLATE, FERRITIN, TIBC, IRON, RETICCTPCT in the last 72 hours.  Sepsis Labs: No results for input(s): PROCALCITON, LATICACIDVEN in the last 168 hours.  Recent Results (from the past 240 hour(s))  Culture, blood (Routine X 2) w Reflex to ID Panel     Status: None   Collection Time: 06/23/16  6:56 PM  Result Value Ref Range Status   Specimen Description BLOOD RIGHT ANTECUBITAL  Final   Special Requests BOTTLES DRAWN AEROBIC AND ANAEROBIC 6CC  Final   Culture NO GROWTH 5 DAYS  Final   Report Status 06/28/2016 FINAL  Final  Culture, blood (Routine X 2) w Reflex to ID Panel     Status: None   Collection Time: 06/23/16  7:00 PM  Result Value Ref Range Status   Specimen Description BLOOD LEFT HAND  Final   Special Requests BOTTLES DRAWN AEROBIC AND ANAEROBIC 5CC  Final   Culture NO GROWTH 5 DAYS  Final   Report Status 06/28/2016 FINAL  Final  Surgical pcr screen     Status: Abnormal   Collection Time: 06/28/16  1:49 AM  Result Value Ref Range Status   MRSA, PCR NEGATIVE NEGATIVE Final   Staphylococcus aureus POSITIVE (A) NEGATIVE Final    Comment:        The Xpert SA Assay (FDA approved for NASAL specimens in patients over 75 years of age), is one component of a comprehensive surveillance program.  Test performance has been validated by [facility] for patients greater than or equal to 27 year old. It is not intended to diagnose infection nor to guide or monitor treatment.          Radiology Studies: Dg Chest Port 1 View  Result Date: 06/29/2016 CLINICAL DATA:  Hypertension. Status post lower extremity arterial bypass graft EXAM: PORTABLE CHEST 1 VIEW COMPARISON:  June 28, 2016 FINDINGS: Nasogastric tube is been removed. There is mild right base atelectasis. Lungs elsewhere are clear. Heart is upper normal in size with pulmonary vascularity within normal limits. No adenopathy. No bone lesions. IMPRESSION: Mild right base atelectasis. No edema or  consolidation. Stable cardiac silhouette.  Electronically Signed   By: Lowella Grip III M.D.   On: 06/29/2016 08:29   Dg Chest Port 1 View  Result Date: 06/28/2016 CLINICAL DATA:  Status post bypass graft to restore lower extremity circulation. EXAM: PORTABLE CHEST 1 VIEW COMPARISON:  06/18/2016 FINDINGS: Enteric catheter is seen with side hole overlying the expected location of gastric body. Right internal jugular approach central venous catheter sheath is in place terminating in the proximal superior vena cava. Cardiomediastinal silhouette is normal. Mediastinal contours appear intact. There is no evidence of focal airspace consolidation, pleural effusion or pneumothorax. Low lung volumes. Osseous structures are without acute abnormality. Soft tissues are grossly normal. IMPRESSION: Low lung volumes otherwise no evidence of acute cardiopulmonary process. Electronically Signed   By: Fidela Salisbury M.D.   On: 06/28/2016 13:10   Dg Abd Portable 1v  Result Date: 06/28/2016 CLINICAL DATA:  Postoperative evaluation; status post lower extremity bypass grafting EXAM: PORTABLE ABDOMEN - 1 VIEW COMPARISON:  August 08, 2013 FINDINGS: There is moderate stool throughout the colon. No bowel dilatation or air-fluid level suggesting bowel obstruction. No free air. There are phleboliths in the pelvis. There is a drain lateral to the left hip. There are surgical clips overlying each hip joint region. IMPRESSION: Bowel gas pattern unremarkable.  Drain lateral to left hip. Electronically Signed   By: Lowella Grip III M.D.   On: 06/28/2016 13:16        Scheduled Meds: . amLODipine  2.5 mg Oral Daily  . atorvastatin  40 mg Oral q1800  . chlorhexidine  15 mL Mouth Rinse BID  . Chlorhexidine Gluconate Cloth  6 each Topical Daily  . docusate  100 mg Oral BID  . enoxaparin (LOVENOX) injection  30 mg Subcutaneous Q24H  . fenofibrate  160 mg Oral Daily  . gabapentin  600 mg Oral Q8H  . mouth rinse  15  mL Mouth Rinse BID  . methocarbamol  500 mg Oral TID  . metoprolol tartrate  25 mg Oral BID  . mupirocin ointment  1 application Nasal BID  . nicotine  7 mg Transdermal Daily  . pantoprazole sodium  40 mg Oral Daily  . sodium chloride flush  3 mL Intravenous Q12H   Continuous Infusions: . 0.45 % NaCl with KCl 20 mEq / L 125 mL/hr at 06/29/16 1200     LOS: 6 days      [facility], MD [facility] Pager [phone]  If 7PM-7AM, please contact night-coverage [email or web address] Password [email or web address] 06/29/2016, 6:04 PM

## 2016-06-30 MED ORDER — POTASSIUM CHLORIDE 10 MEQ/100ML IV SOLN
10.0000 meq | INTRAVENOUS | Status: AC
  Administered 2016-06-30 (×4): 10 meq via INTRAVENOUS
  Filled 2016-06-30 (×4): qty 100

## 2016-06-30 MED ORDER — POTASSIUM CHLORIDE 10 MEQ/100ML IV SOLN
10.0000 meq | INTRAVENOUS | Status: DC
  Administered 2016-06-30 (×2): 10 meq via INTRAVENOUS
  Filled 2016-06-30 (×2): qty 100

## 2016-06-30 NOTE — Progress Notes (Addendum)
Foley catheter removed at 0945.  Two blisters noted in pt groin region where foley tubing had been next to skin.  Covered with pink foam.

## 2016-06-30 NOTE — Progress Notes (Signed)
Pt ambulated 28200ft with minimal assist and front wheel walker. C/O abdominal pain and bloating.  She keeps asking for ice chips, requiring reminders for the importance of strict NPO until + bowel sounds/flatus.

## 2016-06-30 NOTE — Progress Notes (Addendum)
[facility] [place]  Subjective  - Doing a little better.  No flatus passed yet.   Objective 131/63 (!) 104 98.8 F (37.1 C) (Oral) 18 92%  Intake/Output Summary (Last 24 hours) at 06/30/16 0803 Last data filed at 06/29/16 1200  Gross per 24 hour  Intake                0 ml  Output              200 ml  Net             -200 ml   Palpable DP bilateral  Left great toe dry gangrene no change Abdomen soft, incision healing well.  No BS to auscultation. Groins with wound vac in place Heart RRR Lungs non labored breathing   Assessment/Planning: POD # 2 Aortobifemoral bypass graft (14 x 7 mm Dacron graft) Maintain NPO No labs this am will order cbc, bmet for tomorrow.      Frances Graham, Frances [facility] 06/30/2016 8:03 AM -- D/c foley Both feet warm Agree with above Probably clears tomorrow Replete Frances Graham  Frances Mccartt, MD [facility] [place] Office: [phone] Pager: [phone]  Laboratory Lab Results:  Recent Labs  06/28/16 1350 06/29/16 0508  WBC 12.5* 11.9*  HGB 9.4* 9.7*  HCT 29.1* 30.9*  PLT 295 272   BMET  Recent Labs  06/28/16 1228 06/29/16 0508  NA 136 139  K 3.5 3.2*  CL 102 102  CO2 24 28  GLUCOSE 131* 109*  BUN 10 6  CREATININE 0.62 0.67  CALCIUM 8.5* 8.3*    COAG Lab Results  Component Value Date   INR 1.15 06/28/2016   INR 1.08 06/23/2016   INR 1.00 06/18/2016   No results found for: PTT

## 2016-07-01 LAB — BASIC METABOLIC PANEL
Anion gap: 10 (ref 5–15)
BUN: 8 mg/dL (ref 6–20)
CALCIUM: 8.2 mg/dL — AB (ref 8.9–10.3)
CHLORIDE: 105 mmol/L (ref 101–111)
CO2: 24 mmol/L (ref 22–32)
CREATININE: 0.59 mg/dL (ref 0.44–1.00)
Glucose, Bld: 81 mg/dL (ref 65–99)
Potassium: 3.6 mmol/L (ref 3.5–5.1)
SODIUM: 139 mmol/L (ref 135–145)

## 2016-07-01 LAB — CBC
HCT: 27.9 % — ABNORMAL LOW (ref 36.0–46.0)
Hemoglobin: 8.5 g/dL — ABNORMAL LOW (ref 12.0–15.0)
MCH: 30.1 pg (ref 26.0–34.0)
MCHC: 30.5 g/dL (ref 30.0–36.0)
MCV: 98.9 fL (ref 78.0–100.0)
Platelets: 257 K/uL (ref 150–400)
RBC: 2.82 MIL/uL — ABNORMAL LOW (ref 3.87–5.11)
RDW: 13.8 % (ref 11.5–15.5)
WBC: 11.5 K/uL — ABNORMAL HIGH (ref 4.0–10.5)

## 2016-07-01 MED ORDER — CIPROFLOXACIN HCL 500 MG PO TABS
500.0000 mg | ORAL_TABLET | Freq: Two times a day (BID) | ORAL | Status: DC
  Administered 2016-07-01 – 2016-07-03 (×5): 500 mg via ORAL
  Filled 2016-07-01 (×5): qty 1

## 2016-07-01 NOTE — Progress Notes (Addendum)
  [facility] Progress Note  Subjective  - POD #3  Abdomen hurts. Passing flatus.   Objective Vitals:   07/01/16 0238 07/01/16 0443  BP: (!) 125/56 (!) 122/52  Pulse: 100 96  Resp: 17 18  Temp: 98.7 F (37.1 C) 98.4 F (36.9 C)    Intake/Output Summary (Last 24 hours) at 07/01/16 0852 Last data filed at 07/01/16 0340  Gross per 24 hour  Intake          3708.33 ml  Output             1650 ml  Net          2058.33 ml   Right great toe black. No drainage.  Abdomen soft and nontender. Active bowel sounds.  Incisional VACs in place.  Feet warm bilaterally.   Assessment/Planning: 50 y.o. female is s/p: aortobifemoral bypass 3 Days Post-Op   Passing flatus. Start full liquids today. Continue to mobilize. Hypokalemia resolved.  Lovenox for DVT prophylaxis.   Raymond GurneyKimberly A Trinh 07/01/2016 8:52 AM --  Laboratory CBC    Component Value Date/Time   WBC 11.5 (H) 07/01/2016 0253   HGB 8.5 (L) 07/01/2016 0253   HCT 27.9 (L) 07/01/2016 0253   PLT 257 07/01/2016 0253    BMET    Component Value Date/Time   NA 139 07/01/2016 0253   K 3.6 07/01/2016 0253   CL 105 07/01/2016 0253   CO2 24 07/01/2016 0253   GLUCOSE 81 07/01/2016 0253   BUN 8 07/01/2016 0253   CREATININE 0.59 07/01/2016 0253   CALCIUM 8.2 (L) 07/01/2016 0253   GFRNONAA >60 07/01/2016 0253   GFRAA >60 07/01/2016 0253   Antibiotics Anti-infectives    Start     Dose/Rate Route Frequency Ordered Stop   06/28/16 2000  vancomycin (VANCOCIN) IVPB 1000 mg/200 mL premix     1,000 mg 200 mL/hr over 60 Minutes Intravenous Every 12 hours 06/28/16 1820 06/29/16 1021   06/28/16 0700  vancomycin (VANCOCIN) IVPB 1000 mg/200 mL premix     1,000 mg 200 mL/hr over 60 Minutes Intravenous To ShortStay Surgical 06/27/16 0837 06/28/16 0912      Maris BergerKimberly Trinh, PA-C [facility] Office: [phone] Pager: [phone] 07/01/2016 8:52 AM  I have interviewed the patient and  examined the patient. I agree with the findings by the PA. I explained that she will eventually need a right great toe amp. Will let it declare itself. Elevate legs for leg swelling.  PTx for ambulation. Start full liquids.  Given some mild cellulitis of right foot and dry gangrene of right great toe, Will start po Cipro (allergic to Keflex).  Cari Carawayhris Mallisa [place]ameda, MD [phone]

## 2016-07-01 NOTE — Care Management Note (Signed)
Case Management Note Donn PieriniKristi Annistyn Depass RN, BSN Unit 2W-Case Manager [phone]  Patient Details  Name: Frances Haggardngela Kosa MRN: [id number] Date of Birth: 12-13-1965  Subjective/Objective:  Pt admitted with  PAD and non healing ulcer                 Action/Plan: PTA pt lived at home - plan for OR on Friday 10/6- for aortobifemoral bypass - CM to follow post op for d/c needs- pt has Medicaid benefits- PCP- Kaleen MaskELKINS, WILSON OLIVER    Expected Discharge Date:                  Expected Discharge Plan:  Home/Self Care  In-House Referral:     Discharge planning Services  CM Consult  Post Acute Care Choice:  Home Health, Durable Medical Equipment Choice offered to:  Patient  DME Arranged:    DME Agency:     HH Arranged:    HH Agency:     Status of Service:  In process, will continue to follow  If discussed at Long Length of Stay Meetings, dates discussed:    Additional Comments:  07/01/16- 1200- Donn PieriniKristi Birdia Jaycox RN, CM- spoke with pt at bedside regarding insurance concerns and d/c needs- per pt she will loose her Medicaid coverage as of Jan. 1 2018- she is concerned how she will manage after this with her medications and such- discussed PCP- which is Dr. Jeannetta NapElkins- pt states that she has been with him a long time and would like to continue there is able- will give pt additional resources to other clinics in case needed. Also discussed medications and need for her to discuss this with her PCP to be sure as many as possible are on the $4 generic list come the first of the year- will give pt a list of $4 [place] drugs for reference. Will also call [facility] to see if they will meet with pt about financial situation. D/C needs also discussed- pt already has RW, will need 3n1 and shower chair for d/c- HHPT recommended- but pt does not have a Medicaid qualifying dx for HHPT- however may benefit from a HHRN at discharge which medicaid would cover. [facility] needed [facility]- 3n1, and  shower chair.  CM will continue to follow pt for d/c needs.   Darrold SpanWebster, Yaqueline [place], RN 07/01/2016, 2:31 PM

## 2016-07-01 NOTE — Progress Notes (Signed)
Patient ambulated 16350ft using a front wheel rolling walker, patient returned back to the room when she could not tolerate ambulation anymore due to pain in her groin incision, patient now resting quietly in bed , call light within reach will continue to monitor

## 2016-07-01 NOTE — Evaluation (Signed)
Occupational Therapy Evaluation Patient Details Name: Frances Graham MRN: [id number] DOB: 04-Mar-1966 Today's Date: 07/01/2016    History of Present Illness 50 yo admitted with aortoiliac occlusive dz s/p aortobifem BPG. PMhx: PVD, AKI, HTN, ADD   Clinical Impression   This 50 yo female admitted and underwent above presents to acute OT with deficits below (see OT problem list) thus affecting her PLOF of Independent with basic and IADLs. She will benefit from acute OT without need for follow up.    Follow Up Recommendations  No OT follow up    Equipment Recommendations  3 in 1 bedside comode;Tub/shower seat       Precautions / Restrictions Precautions Precautions: Fall Precaution Comments: bil groin wound VAC; gangreneous right great toe Restrictions Weight Bearing Restrictions: No      Mobility Bed Mobility Overal bed mobility: Modified Independent Bed Mobility: Supine to Sit     Supine to sit: Modified independent (Device/Increase time);HOB elevated        Transfers Overall transfer level: Needs assistance Equipment used: Rolling walker (2 wheeled) Transfers: Sit to/from Stand Sit to Stand: Min guard              Balance Overall balance assessment: Needs assistance Sitting-balance support: No upper extremity supported;Feet supported Sitting balance-Leahy Scale: Good     Standing balance support: Bilateral upper extremity supported Standing balance-Leahy Scale: Poor Standing balance comment: reliant on RW when up on her feet due to pain                            ADL Overall ADL's : Needs assistance/impaired Eating/Feeding: Independent;Sitting   Grooming: Sitting;Set up   Upper Body Bathing: Set up;Sitting   Lower Body Bathing: Minimal assistance (for right foot, minguard A sit<>stand)   Upper Body Dressing : Set up;Sitting   Lower Body Dressing: Minimal assistance (for right sock, minguard A sit<>stand)   Toilet Transfer: Min  Hotel managerguard;Stand-pivot;RW Toilet Transfer Details (indicate cue type and reason): bed>to recliner next to bed Toileting- ArchitectClothing Manipulation and Hygiene: Min guard;Sit to/from stand                         Pertinent Vitals/Pain Pain Assessment: 0-10 Pain Score: 7  (up to a 9 with RLE on dependent position) Pain Location: stomach, groin, Bil LEs (R>L) Pain Descriptors / Indicators: Aching;Tightness;Throbbing;Tingling;Guarding;Grimacing Pain Intervention(s): Limited activity within patient's tolerance;Monitored during session;Relaxation (not yet time for pain meds, deep breathing)        Extremity/Trunk Assessment Upper Extremity Assessment Upper Extremity Assessment: Overall WFL for tasks assessed           Communication Communication Communication: No difficulties   Cognition Arousal/Alertness: Awake/alert Behavior During Therapy: WFL for tasks assessed/performed Overall Cognitive Status: Within Functional Limits for tasks assessed                 General Comments: emotional due to reports doctor told her this AM that she is going to loose her right great toe and she reports to me she is concerned about her insurance because her Medicaid runs out in December              Home Living Family/patient expects to be discharged to:: Private residence Living Arrangements: Children (she lives with her 48 yo daughter) Available Help at Discharge: Family;Available 24 hours/day Type of Home: House       Home Layout: Two level (able to live  on main (has bed) but no bathroom)     Bathroom Shower/Tub: Tub/shower unit;Curtain Shower/tub characteristics: Engineer, building services: Standard (upstairs)     Home Equipment: Environmental consultant - 2 wheels          Prior Functioning/Environment Level of Independence: Independent                 OT Problem List: Impaired balance (sitting and/or standing);Pain;Decreased knowledge of use of DME or AE   OT  Treatment/Interventions: Self-care/ADL training;Balance training;DME and/or AE instruction;Patient/family education    OT Goals(Current goals can be found in the care plan section) Acute Rehab OT Goals Patient Stated Goal: return home with daughter and figure out insurance concerns OT Goal Formulation: With patient Time For Goal Achievement: 07/15/16 Potential to Achieve Goals: Good  OT Frequency: Min 2X/week              End of Session Equipment Utilized During Treatment: Rolling walker  Activity Tolerance: Patient limited by pain Patient left: in chair;with call bell/phone within reach;with chair alarm set   Time: 1610-9604 OT Time Calculation (min): 26 min Charges:  OT General Charges $OT Visit: 1 Procedure OT Evaluation $OT Eval Moderate Complexity: 1 Procedure OT Treatments $Self Care/Home Management : 8-22 mins  Evette Georges [phone] 07/01/2016, 9:21 AM

## 2016-07-02 LAB — BASIC METABOLIC PANEL
ANION GAP: 10 (ref 5–15)
BUN: 6 mg/dL (ref 6–20)
CALCIUM: 8.2 mg/dL — AB (ref 8.9–10.3)
CO2: 23 mmol/L (ref 22–32)
CREATININE: 0.51 mg/dL (ref 0.44–1.00)
Chloride: 104 mmol/L (ref 101–111)
GLUCOSE: 84 mg/dL (ref 65–99)
Potassium: 3.8 mmol/L (ref 3.5–5.1)
Sodium: 137 mmol/L (ref 135–145)

## 2016-07-02 LAB — TYPE AND SCREEN
ABO/RH(D): A POS
Antibody Screen: NEGATIVE
Unit division: 0
Unit division: 0

## 2016-07-02 LAB — CBC
HEMATOCRIT: 27.6 % — AB (ref 36.0–46.0)
Hemoglobin: 8.4 g/dL — ABNORMAL LOW (ref 12.0–15.0)
MCH: 30 pg (ref 26.0–34.0)
MCHC: 30.4 g/dL (ref 30.0–36.0)
MCV: 98.6 fL (ref 78.0–100.0)
PLATELETS: 264 10*3/uL (ref 150–400)
RBC: 2.8 MIL/uL — ABNORMAL LOW (ref 3.87–5.11)
RDW: 13.8 % (ref 11.5–15.5)
WBC: 9.6 10*3/uL (ref 4.0–10.5)

## 2016-07-02 MED ORDER — BISACODYL 10 MG RE SUPP
10.0000 mg | Freq: Every day | RECTAL | Status: DC | PRN
Start: 2016-07-02 — End: 2016-07-03
  Filled 2016-07-02: qty 1

## 2016-07-02 MED ORDER — GABAPENTIN 600 MG PO TABS
600.0000 mg | ORAL_TABLET | Freq: Three times a day (TID) | ORAL | Status: DC
  Administered 2016-07-02 – 2016-07-03 (×4): 600 mg via ORAL
  Filled 2016-07-02 (×4): qty 1

## 2016-07-02 MED ORDER — BISACODYL 10 MG RE SUPP
10.0000 mg | Freq: Once | RECTAL | Status: AC
  Administered 2016-07-02: 10 mg via RECTAL

## 2016-07-02 MED ORDER — OXYCODONE-ACETAMINOPHEN 5-325 MG PO TABS
1.0000 | ORAL_TABLET | ORAL | Status: DC | PRN
  Administered 2016-07-02 – 2016-07-03 (×6): 2 via ORAL
  Filled 2016-07-02 (×6): qty 2

## 2016-07-02 MED ORDER — LISDEXAMFETAMINE DIMESYLATE 20 MG PO CAPS: 40.0000 mg | ORAL_CAPSULE | Freq: Every day | ORAL | Status: DC | PRN

## 2016-07-02 NOTE — Progress Notes (Addendum)
  AAA Progress Note    07/02/2016 7:24 AM 4 Days Post-Op  Subjective:  Wants to go home.  She has not had a BM, but she is passing gas.  Denies any nausea/vomiting.  Afebrile HR 90's NSR 130's-140's systolic 98% RA  Vitals:   07/01/16 2000 07/02/16 0509  BP: 139/68 (!) 145/70  Pulse: 90 98  Resp: 20 20  Temp: 97.6 F (36.4 C) 98.4 F (36.9 C)    Physical Exam: Cardiac:  regular Lungs:  Non labored Abdomen:   Soft, NT/ND; +BS; +flauts; -BM Incisions:  Midline incision is clean and dry and bilateral groins with pravena wound vacs in place. Extremities:  +palpable right PT and + palpable left DP; right great toe is black  CBC    Component Value Date/Time   WBC 9.6 07/02/2016 0331   RBC 2.80 (L) 07/02/2016 0331   HGB 8.4 (L) 07/02/2016 0331   HCT 27.6 (L) 07/02/2016 0331   PLT 264 07/02/2016 0331   MCV 98.6 07/02/2016 0331   MCH 30.0 07/02/2016 0331   MCHC 30.4 07/02/2016 0331   RDW 13.8 07/02/2016 0331   LYMPHSABS 4.4 (H) 06/23/2016 1340   MONOABS 1.4 (H) 06/23/2016 1340   EOSABS 0.2 06/23/2016 1340   BASOSABS 0.0 06/23/2016 1340    BMET    Component Value Date/Time   NA 137 07/02/2016 0331   K 3.8 07/02/2016 0331   CL 104 07/02/2016 0331   CO2 23 07/02/2016 0331   GLUCOSE 84 07/02/2016 0331   BUN 6 07/02/2016 0331   CREATININE 0.51 07/02/2016 0331   CALCIUM 8.2 (L) 07/02/2016 0331   GFRNONAA >60 07/02/2016 0331   GFRAA >60 07/02/2016 0331    INR    Component Value Date/Time   INR 1.15 06/28/2016 1228     Intake/Output Summary (Last 24 hours) at 07/02/16 0724 Last data filed at 07/01/16 1800  Gross per 24 hour  Intake          1791.67 ml  Output                0 ml  Net          1791.67 ml     Assessment/Plan:  50 y.o. female is s/p  Aortobifemoral bypass graft (14 x 7 mm Dacron graft) 4 Days Post-Op  -pt doing well this morning.  She tolerated her clear liquid diet yesterday and did not have any nausea/vomiting.  She is passing  flatus.  Will give dulcolax supp this am & advance diet for lunch. -continue mobilization and IS -continue incisional vacs to both groins -continue Cipro for mild cellulitis right foot & gangrene of right great toe. -restart home meds (Vyvanse, and Neurontin).  Will hold on restarting ACEI/HCTZ until tolerating diet as her BP is okay. -will need to start Percocet 1-2 q4hr prn pain as she is only getting dilaudid. -heplock IV -hopefully home tomorrow.    Doreatha MassedSamantha Rhyne, PA-C [facility] [phone] 07/02/2016 7:24 AM  I have interviewed the patient and examined the patient. I agree with the findings by the PA. Advance to regular diet. Possibly home tomorrow. I can follow the right great toe as an outpt  Cari Carawayhris Dickson, MD [phone]

## 2016-07-02 NOTE — Progress Notes (Signed)
Physical Therapy Treatment Patient Details Name: Frances Graham MRN: [id number] DOB: [birth year] Today's Date: 07/02/2016    History of Present Illness 50 yo admitted with aortoiliac occlusive dz s/p aortobifem BPG. PMhx: PVD, AKI, HTN, ADD    PT Comments    Frances Graham demonstrates improved cognition although limited by anxiety and emotional of probable loss of toe. Pt educated to safety and posture with use of rW as well as importance of mobility and gait. Pt educated for HEP. Encouragement and reassurance provided throughout as pt anxious about going home and overall medical status. Will follow acutely.   Follow Up Recommendations  No PT follow up     Equipment Recommendations  None recommended by PT    Recommendations for Other Services       Precautions / Restrictions Precautions Precautions: Fall Precaution Comments: bil groin wound VAC; gangreneous right great toe    Mobility  Bed Mobility Overal bed mobility: Modified Independent                Transfers Overall transfer level: Modified independent                  Ambulation/Gait Ambulation/Gait assistance: Min guard Ambulation Distance (Feet): 300 Feet Assistive device: Rolling walker (2 wheeled) Gait Pattern/deviations: Step-through pattern;Decreased stride length;Trunk flexed   Gait velocity interpretation: Below normal speed for age/gender General Gait Details: cues for posture and  position in RW   Stairs Stairs: Yes Stairs assistance: Min guard Stair Management: Step to pattern;Forwards;One rail Left Number of Stairs: 5 General stair comments: cues for sequence, reassurance throughout due to anxiety  Wheelchair Mobility    Modified Rankin (Stroke Patients Only)       Balance     Sitting balance-Leahy Scale: Good       Standing balance-Leahy Scale: Fair                      Cognition Arousal/Alertness: Awake/alert Behavior During Therapy: Anxious Overall  Cognitive Status: Within Functional Limits for tasks assessed                 General Comments: anxiious about being in hospital where mom died, emotional about potential loss of toe    Exercises General Exercises - Lower Extremity Long Arc Quad: AROM;Both;Seated;20 reps Hip Flexion/Marching: AROM;Seated;Both;20 reps    General Comments        Pertinent Vitals/Pain Pain Score: 6  Pain Location: bil groin Rt>Lt Pain Descriptors / Indicators: Aching;Sore Pain Intervention(s): Limited activity within patient's tolerance;Monitored during session;Repositioned;Patient requesting pain meds-RN notified    Home Living                      Prior Function            PT Goals (current goals can now be found in the care plan section) Progress towards PT goals: Progressing toward goals    Frequency           PT Plan Discharge plan needs to be updated    Co-evaluation             End of Session Equipment Utilized During Treatment: Gait belt Activity Tolerance: Patient tolerated treatment well Patient left: in bed;with call bell/phone within reach;with nursing/sitter in room     Time: 1340-1407 PT Time Calculation (min) (ACUTE ONLY): 27 min  Charges:  $Gait Training: 8-22 mins $Therapeutic Exercise: 8-22 mins  G CodesToney Sang Beth 2016-07-14, 2:20 PM Delaney Meigs, PT [phone]

## 2016-07-03 ENCOUNTER — Encounter (HOSPITAL_COMMUNITY): Payer: Self-pay | Admitting: Vascular Surgery

## 2016-07-03 ENCOUNTER — Telehealth: Payer: Self-pay | Admitting: Vascular Surgery

## 2016-07-03 LAB — CBC
HEMATOCRIT: 27.2 % — AB (ref 36.0–46.0)
HEMOGLOBIN: 8.8 g/dL — AB (ref 12.0–15.0)
MCH: 31.1 pg (ref 26.0–34.0)
MCHC: 32.4 g/dL (ref 30.0–36.0)
MCV: 96.1 fL (ref 78.0–100.0)
PLATELETS: 261 10*3/uL (ref 150–400)
RBC: 2.83 MIL/uL — AB (ref 3.87–5.11)
RDW: 13.9 % (ref 11.5–15.5)
WBC: 9 10*3/uL (ref 4.0–10.5)

## 2016-07-03 LAB — BASIC METABOLIC PANEL
ANION GAP: 10 (ref 5–15)
BUN: 6 mg/dL (ref 6–20)
CO2: 24 mmol/L (ref 22–32)
Calcium: 8.3 mg/dL — ABNORMAL LOW (ref 8.9–10.3)
Chloride: 105 mmol/L (ref 101–111)
Creatinine, Ser: 0.57 mg/dL (ref 0.44–1.00)
GFR calc Af Amer: >60 mL/min (ref >60)
GLUCOSE: 90 mg/dL (ref 65–99)
POTASSIUM: 3.4 mmol/L — AB (ref 3.5–5.1)
Sodium: 139 mmol/L (ref 135–145)

## 2016-07-03 MED ORDER — CLONAZEPAM 0.5 MG PO TABS: 0.5000 mg | ORAL_TABLET | Freq: Two times a day (BID) | ORAL | Status: DC | PRN

## 2016-07-03 MED ORDER — CLONAZEPAM 0.5 MG PO TABS: 0.5000 mg | ORAL_TABLET | Freq: Two times a day (BID) | ORAL | 0 refills | Status: DC | PRN

## 2016-07-03 MED ORDER — OXYCODONE-ACETAMINOPHEN 5-325 MG PO TABS: 1.0000 | ORAL_TABLET | ORAL | 0 refills | Status: DC | PRN

## 2016-07-03 MED ORDER — ZOLPIDEM TARTRATE 5 MG PO TABS: 5.0000 mg | ORAL_TABLET | Freq: Every evening | ORAL | 0 refills | Status: DC | PRN

## 2016-07-03 NOTE — Progress Notes (Signed)
Discussed with the patient and all questioned fully answered. She will call me if any problems arise. Pt given paper prescriptions for percocet, amiben, and klonopin. Educated with PA on how to remove Prevena wound vac from bilateral groin on Friday. Pt verbalized understanding. Pt educated on smoking cessation. IV removed. Telemetry removed, CCMD notified. Pt had Brittni Hult at home. 3 in 1 delivered prior to DC.  Leonidas Rombergaitlin S Bumbledare, RN

## 2016-07-03 NOTE — Telephone Encounter (Signed)
-----   Message from Phillips Odorarol S Pullins, RN sent at 07/03/2016  1:56 PM EDT ----- Regarding: RE: needs 2 week f/u with Dr. Edilia Boickson Yes. Thank you.  ----- Message ----- From: Salvadore OxfordBarbara E Graves Sent: 07/03/2016  11:55 AM To: Phillips Odorarol S Pullins, RN Subject: RE: needs 2 week f/u with Dr. Edilia Boickson          Is this pt ok to be seen by PA on 11/1 at the 3 pm hour to see PA? ( I am still holding the 2 pm PA to see for someone else)   ----- Message ----- From: Phillips Odorarol S Pullins, RN Sent: 07/03/2016  11:22 AM To: Donita Brooks[facility] Admin Pool Subject: needs 2 week f/u with Dr. Edilia Boickson                ----- Message ----- From: Lars MageEmma M Collins, PA-C Sent: 07/03/2016   7:50 AM To: [facility] Charge Pool  S/P open aorto by fem by pass Dr. Edilia Boickson 2 weeks

## 2016-07-03 NOTE — Care Management Note (Signed)
Case Management Note  Patient Details  Name: Frances Graham MRN: [id number] Date of Birth: 11-02-1965  Subjective/Objective:  50 y.o. F 5 D ss/p AFBG. Has RW at home will need 3:1. CM notified DME rep, Fayrene FearingJames. Who will deliver prior to discharge. No further CM needs at this time.                   Action/Plan:Discharge.    Expected Discharge Date:                  Expected Discharge Plan:  Home/Self Care  In-House Referral:     Discharge planning Services  CM Consult  Post Acute Care Choice:  Home Health, Durable Medical Equipment Choice offered to:  Patient  DME Arranged:  3-N-1 DME Agency:  [facility]  HH Arranged:  NA HH Agency:  NA  Status of Service:  Completed, signed off  If discussed at Long Length of Stay Meetings, dates discussed:    Additional Comments:  Yvone NeuCrutchfield, Truitt Cruey M, RN 07/03/2016, 10:32 AM

## 2016-07-03 NOTE — Progress Notes (Addendum)
   VASCULAR SURGERY ASSESSMENT & PLAN:  5 Days Post-Op s/p: AFBG  Having anxiety issues. She is requesting Klonopin for discharge. We can provide this, but she will need to f/u with her medical MD for further Rx's  Home today on Keflex.  I will follow the right great toe as an outpt. Soak foot daily in luke warm Dial soap soaks.  Prevena dressings come off in 2 days.  AKI was ruled out.   SUBJECTIVE: Tolerating regular diet. Had a BM  PHYSICAL EXAM: Vitals:   07/02/16 1043 07/02/16 1432 07/02/16 2216 07/03/16 0457  BP: 132/77 (!) 130/59 (!) 141/77 130/69  Pulse: 90 87 91 85  Resp: 18 18 18 16   Temp: 97.8 F (36.6 C) 98.2 F (36.8 C) 98 F (36.7 C) 98 F (36.7 C)  TempSrc: Oral Oral Oral Oral  SpO2: 99% 99% 95% 94%  Weight:      Height:       Incision looks fine Normal pitch BS Right great toe stable.   LABS: Lab Results  Component Value Date   WBC 9.0 07/03/2016   HGB 8.8 (L) 07/03/2016   HCT 27.2 (L) 07/03/2016   MCV 96.1 07/03/2016   PLT 261 07/03/2016   Lab Results  Component Value Date   CREATININE 0.57 07/03/2016   Lab Results  Component Value Date   INR 1.15 06/28/2016    Principal Problem:   Aortoiliac occlusive disease (HCC) Active Problems:   Peripheral vascular disease (HCC)   Acute kidney injury (HCC)   Essential hypertension   Ischemic ulcer of foot (HCC)   Cardiovascular risk factor   Leukocytosis   Tobacco abuse   Chronic low back pain   ADD (attention deficit disorder)   Pre-operative clearance   Mixed hyperlipidemia   Cari CarawayChris Kevina Piloto Beeper: [phone] 07/03/2016

## 2016-07-03 NOTE — Telephone Encounter (Signed)
Spoke to pt on home # for f/u with PA here on 11/1

## 2016-07-04 ENCOUNTER — Other Ambulatory Visit: Payer: Self-pay

## 2016-07-04 MED ORDER — CIPROFLOXACIN HCL 500 MG PO TABS: 500.0000 mg | ORAL_TABLET | Freq: Two times a day (BID) | ORAL | 0 refills | Status: AC

## 2016-07-08 ENCOUNTER — Ambulatory Visit (INDEPENDENT_AMBULATORY_CARE_PROVIDER_SITE_OTHER): Payer: Medicaid Other | Admitting: Family

## 2016-07-08 ENCOUNTER — Telehealth: Payer: Self-pay

## 2016-07-08 VITALS — BP 124/82 | HR 98 | Temp 97.9°F | Resp 18 | Ht 67.0 in | Wt 223.0 lb

## 2016-07-08 DIAGNOSIS — Z95828 Presence of other vascular implants and grafts: Secondary | ICD-10-CM

## 2016-07-08 DIAGNOSIS — I700 Atherosclerosis of aorta: Secondary | ICD-10-CM

## 2016-07-08 DIAGNOSIS — I7409 Other arterial embolism and thrombosis of abdominal aorta: Secondary | ICD-10-CM

## 2016-07-08 DIAGNOSIS — F17200 Nicotine dependence, unspecified, uncomplicated: Secondary | ICD-10-CM

## 2016-07-08 DIAGNOSIS — I7410 Embolism and thrombosis of unspecified parts of aorta: Secondary | ICD-10-CM

## 2016-07-08 MED ORDER — OXYCODONE-ACETAMINOPHEN 5-325 MG PO TABS: 1.0000 | ORAL_TABLET | Freq: Four times a day (QID) | ORAL | 0 refills | Status: DC | PRN

## 2016-07-08 NOTE — Telephone Encounter (Signed)
Pt. Called to report she is out of pain medication, and has a lot of soreness at the umbilicus.  Reported a slight pulling apart of the incision, at the umbilicus. Denied redness, warmth, drainage, fever or chills.  Stated the discomfort at the umbilicus has been this way all along, but since she is out of pain medication, it is hard to get comfortable.  Stated she is taking Ibuprofen, but it is not helping.  Offered an appt. Today with NP at 3:15 PM.  Pt. Agreed with plan.

## 2016-07-08 NOTE — Telephone Encounter (Signed)
appt added

## 2016-07-08 NOTE — Progress Notes (Signed)
Postoperative Visit   History of Present Illness  Denna Haggardngela Krigbaum is a 50 y.o. year old female who is s/p Aortobifemoral bypass graft (14 x 7 mm Dacron graft) on 06/28/16 by Dr. Edilia Boickson for Leriche's syndrome with infrarenal aortic occlusion. She presented with a chronic aortic occlusion with progressive ischemic changes to both feet. It was felt that she had limb threatening ischemia bilaterally and her only option for limb salvage was attempted revascularization. She underwent preoperative cardiac clearance. Good Doppler signals in both feet at the completion of the procedure.  She returns today requesting another prescription for oxycodone 5/325 which was prescribed by our service on 07/03/16. At that time 30 tablets were dispensed.  She reports pain at her abdominal incision, both groin incisions, and in the toes of both feet.  She states that she did not have much sensation in her legs and feet before the surgery, and has good sensation now.  She denies fever or chills.   DMHDDSAS query report run.  She obtained a prescription for clonazepam also on 07/03/16. She obtained a prescription for oxycodone/APAP on 06/19/16, 06/10/16, and 06/07/16. She also obtained hydrocodone/APAP prescriptions on 05/29/16 and 05/15/16. She obtained a prescription for APAP with codeine #3 on 04/24/16. Pt [facility] upset her stomach.  The above prescriptions were prescribed by 5 different prescribers.   The patient is able to complete their activities of daily living.    For VQI Use Only  PRE-ADM LIVING: Home  AMB STATUS: Ambulatory  Physical Examination  Vitals:   07/08/16 1615  BP: 124/82  Pulse: 98  Resp: 18  Temp: 97.9 F (36.6 C)  TempSrc: Oral  SpO2: 98%  Weight: 223 lb (101.2 kg)  Height: 5\' 7"  (1.702 m)   Body mass index is 34.93 kg/m.   Abdominal incision is healing well. Bilateral groin incisions have a small amount of  fibrinous exudate at the lateral aspects, no drainage, mild erythema and swelling. Right great toe with clearly defined line of demarcation at mid toe, dry gangrene distally, scant foul smelling drainage at the line of demarcation.  Several toes of both feet with small stable ulcers.  Bilateral PT pulses are 2+ right, 1+ left, bilateral DP pulses are faintly palpable.    Medical Decision Making  Denna Haggardngela Koogler is a 50 y.o. year old female who presents s/p Aortobifemoral bypass graft (14 x 7 mm Dacron graft) on 06/28/16 by Dr. Edilia Boickson for Leriche's syndrome with infrarenal aortic occlusion. She has resolution of her preoperative symptoms in her legs, she can feel her feet, numbness has dissipated.   She continues to smoke 2 cigarettes/day.  The patient was counseled re smoking cessation and given several free resources re smoking cessation.  She continues to have significant pain at her abdominal and groin incisions, and in the toes of both feet. Right great toe has a well demarcated line of dry gangrene at mid toe distally; this may self amputate.  There are small stable ulcers at the tips of several toes.  Daily local wound care to toes or both feet and both groins.   After discussing with Dr. Myra GianottiBrabham, prescribed oxycodone/APAP 5/325 mg, 1-2 tablets every 6 hours prn pain, disp #20, 0 refills.   Follow up with Dr. Edilia Boickson on 07/24/16 as already scheduled.    I discussed in depth with the patient the nature of atherosclerosis, and emphasized the importance of maximal medical management including strict  control of blood pressure, blood glucose, and lipid levels, obtaining regular exercise, and cessation of smoking.  The patient is aware that without maximal medical management the underlying atherosclerotic disease process will progress, limiting the benefit of any interventions.  Thank you for allowing [place] to participate in this patient's care.  NICKEL, Carma Lair, RN, MSN, FNP-C [facility] [place] Office: [phone]  07/08/2016, 4:23 PM  Clinic MD: Myra Gianotti

## 2016-07-08 NOTE — Patient Instructions (Signed)
Peripheral Vascular Disease Peripheral vascular disease (PVD) is a disease of the blood vessels that are not part of your heart and brain. A simple term for PVD is poor circulation. In most cases, PVD narrows the blood vessels that carry blood from your heart to the rest of your body. This can result in a decreased supply of blood to your arms, legs, and internal organs, like your stomach or kidneys. However, it most often affects a person's lower legs and feet. There are two types of PVD.  Organic PVD. This is the more common type. It is caused by damage to the structure of blood vessels.  Functional PVD. This is caused by conditions that make blood vessels contract and tighten (spasm). Without treatment, PVD tends to get worse over time. PVD can also lead to acute ischemic limb. This is when an arm or limb suddenly has trouble getting enough blood. This is a medical emergency. CAUSES Each type of PVD has many different causes. The most common cause of PVD is buildup of a fatty material (plaque) inside of your arteries (atherosclerosis). Small amounts of plaque can break off from the walls of the blood vessels and become lodged in a smaller artery. This blocks blood flow and can cause acute ischemic limb. Other common causes of PVD include:  Blood clots that form inside of blood vessels.  Injuries to blood vessels.  Diseases that cause inflammation of blood vessels or cause blood vessel spasms.  Health behaviors and health history that increase your risk of developing PVD. RISK FACTORS  You may have a greater risk of PVD if you:  Have a family history of PVD.  Have certain medical conditions, including:  High cholesterol.  Diabetes.  High blood pressure (hypertension).  Coronary heart disease.  Past problems with blood clots.  Past injury, such as burns or a broken bone. These may have damaged blood vessels in your limbs.  Buerger disease. This is caused by inflamed blood  vessels in your hands and feet.  Some forms of arthritis.  Rare birth defects that affect the arteries in your legs.  Use tobacco.  Do not get enough exercise.  Are obese.  Are age 50 or older. SIGNS AND SYMPTOMS  PVD may cause many different symptoms. Your symptoms depend on what part of your body is not getting enough blood. Some common signs and symptoms include:  Cramps in your lower legs. This may be a symptom of poor leg circulation (claudication).  Pain and weakness in your legs while you are physically active that goes away when you rest (intermittent claudication).  Leg pain when at rest.  Leg numbness, tingling, or weakness.  Coldness in a leg or foot, especially when compared with the other leg.  Skin or hair changes. These can include:  Hair loss.  Shiny skin.  Pale or bluish skin.  Thick toenails.  Inability to get or maintain an erection (erectile dysfunction). People with PVD are more prone to developing ulcers and sores on their toes, feet, or legs. These may take longer than normal to heal. DIAGNOSIS Your health care provider may diagnose PVD from your signs and symptoms. The health care provider will also do a physical exam. You may have tests to find out what is causing your PVD and determine its severity. Tests may include:  Blood pressure recordings from your arms and legs and measurements of the strength of your pulses (pulse volume recordings).  Imaging studies using sound waves to take pictures of   the blood flow through your blood vessels (Doppler ultrasound).  Injecting a dye into your blood vessels before having imaging studies using:  X-rays (angiogram or arteriogram).  Computer-generated X-rays (CT angiogram).  A powerful electromagnetic field and a computer (magnetic resonance angiogram or MRA). TREATMENT Treatment for PVD depends on the cause of your condition and the severity of your symptoms. It also depends on your age. Underlying  causes need to be treated and controlled. These include long-lasting (chronic) conditions, such as diabetes, high cholesterol, and high blood pressure. You may need to first try making lifestyle changes and taking medicines. Surgery may be needed if these do not work. Lifestyle changes may include:  Quitting smoking.  Exercising regularly.  Following a low-fat, low-cholesterol diet. Medicines may include:  Blood thinners to prevent blood clots.  Medicines to improve blood flow.  Medicines to improve your blood cholesterol levels. Surgical procedures may include:  A procedure that uses an inflated balloon to open a blocked artery and improve blood flow (angioplasty).  A procedure to put in a tube (stent) to keep a blocked artery open (stent implant).  Surgery to reroute blood flow around a blocked artery (peripheral bypass surgery).  Surgery to remove dead tissue from an infected wound on the affected limb.  Amputation. This is surgical removal of the affected limb. This may be necessary in cases of acute ischemic limb that are not improved through medical or surgical treatments. HOME CARE INSTRUCTIONS  Take medicines only as directed by your health care provider.  Do not use any tobacco products, including cigarettes, chewing tobacco, or electronic cigarettes. If you need help quitting, ask your health care provider.  Lose weight if you are overweight, and maintain a healthy weight as directed by your health care provider.  Eat a diet that is low in fat and cholesterol. If you need help, ask your health care provider.  Exercise regularly. Ask your health care provider to suggest some good activities for you.  Use compression stockings or other mechanical devices as directed by your health care provider.  Take good care of your feet.  Wear comfortable shoes that fit well.  Check your feet often for any cuts or sores. SEEK MEDICAL CARE IF:  You have cramps in your legs  while walking.  You have leg pain when you are at rest.  You have coldness in a leg or foot.  Your skin changes.  You have erectile dysfunction.  You have cuts or sores on your feet that are not healing. SEEK IMMEDIATE MEDICAL CARE IF:  Your arm or leg turns cold and blue.  Your arms or legs become red, warm, swollen, painful, or numb.  You have chest pain or trouble breathing.  You suddenly have weakness in your face, arm, or leg.  You become very confused or lose the ability to speak.  You suddenly have a very bad headache or lose your vision.   This information is not intended to replace advice given to you by your health care provider. Make sure you discuss any questions you have with your health care provider.   Document Released: 10/17/2004 Document Revised: 09/30/2014 Document Reviewed: 02/17/2014 Elsevier Interactive Patient Education 2016 Elsevier Inc.     Steps to Quit Smoking  Smoking tobacco can be harmful to your health and can affect almost every organ in your body. Smoking puts you, and those around you, at risk for developing many serious chronic diseases. Quitting smoking is difficult, but it is one of   the best things that you can do for your health. It is never too late to quit. WHAT ARE THE BENEFITS OF QUITTING SMOKING? When you quit smoking, you lower your risk of developing serious diseases and conditions, such as:  Lung cancer or lung disease, such as COPD.  Heart disease.  Stroke.  Heart attack.  Infertility.  Osteoporosis and bone fractures. Additionally, symptoms such as coughing, wheezing, and shortness of breath may get better when you quit. You may also find that you get sick less often because your body is stronger at fighting off colds and infections. If you are pregnant, quitting smoking can help to reduce your chances of having a baby of low birth weight. HOW DO I GET READY TO QUIT? When you decide to quit smoking, create a plan to  make sure that you are successful. Before you quit:  Pick a date to quit. Set a date within the next two weeks to give you time to prepare.  Write down the reasons why you are quitting. Keep this list in places where you will see it often, such as on your bathroom mirror or in your car or wallet.  Identify the people, places, things, and activities that make you want to smoke (triggers) and avoid them. Make sure to take these actions:  Throw away all cigarettes at home, at work, and in your car.  Throw away smoking accessories, such as ashtrays and lighters.  Clean your car and make sure to empty the ashtray.  Clean your home, including curtains and carpets.  Tell your family, friends, and coworkers that you are quitting. Support from your loved ones can make quitting easier.  Talk with your health care provider about your options for quitting smoking.  Find out what treatment options are covered by your health insurance. WHAT STRATEGIES CAN I USE TO QUIT SMOKING?  Talk with your healthcare provider about different strategies to quit smoking. Some strategies include:  Quitting smoking altogether instead of gradually lessening how much you smoke over a period of time. Research shows that quitting "cold turkey" is more successful than gradually quitting.  Attending in-person counseling to help you build problem-solving skills. You are more likely to have success in quitting if you attend several counseling sessions. Even short sessions of 10 minutes can be effective.  Finding resources and support systems that can help you to quit smoking and remain smoke-free after you quit. These resources are most helpful when you use them often. They can include:  Online chats with a counselor.  Telephone quitlines.  Printed self-help materials.  Support groups or group counseling.  Text messaging programs.  Mobile phone applications.  Taking medicines to help you quit smoking. (If you are  pregnant or breastfeeding, talk with your health care provider first.) Some medicines contain nicotine and some do not. Both types of medicines help with cravings, but the medicines that include nicotine help to relieve withdrawal symptoms. Your health care provider may recommend:  Nicotine patches, gum, or lozenges.  Nicotine inhalers or sprays.  Non-nicotine medicine that is taken by mouth. Talk with your health care provider about combining strategies, such as taking medicines while you are also receiving in-person counseling. Using these two strategies together makes you more likely to succeed in quitting than if you used either strategy on its own. If you are pregnant or breastfeeding, talk with your health care provider about finding counseling or other support strategies to quit smoking. Do not take medicine to help you   quit smoking unless told to do so by your health care provider. WHAT THINGS CAN I DO TO MAKE IT EASIER TO QUIT? Quitting smoking might feel overwhelming at first, but there is a lot that you can do to make it easier. Take these important actions:  Reach out to your family and friends and ask that they support and encourage you during this time. Call telephone quitlines, reach out to support groups, or work with a counselor for support.  Ask people who smoke to avoid smoking around you.  Avoid places that trigger you to smoke, such as bars, parties, or smoke-break areas at work.  Spend time around people who do not smoke.  Lessen stress in your life, because stress can be a smoking trigger for some people. To lessen stress, try:  Exercising regularly.  Deep-breathing exercises.  Yoga.  Meditating.  Performing a body scan. This involves closing your eyes, scanning your body from head to toe, and noticing which parts of your body are particularly tense. Purposefully relax the muscles in those areas.  Download or purchase mobile phone or tablet apps (applications)  that can help you stick to your quit plan by providing reminders, tips, and encouragement. There are many free apps, such as QuitGuide from the CDC (Centers for Disease Control and Prevention). You can find other support for quitting smoking (smoking cessation) through smokefree.gov and other websites. HOW WILL I FEEL WHEN I QUIT SMOKING? Within the first 24 hours of quitting smoking, you may start to feel some withdrawal symptoms. These symptoms are usually most noticeable 2-3 days after quitting, but they usually do not last beyond 2-3 weeks. Changes or symptoms that you might experience include:  Mood swings.  Restlessness, anxiety, or irritation.  Difficulty concentrating.  Dizziness.  Strong cravings for sugary foods in addition to nicotine.  Mild weight gain.  Constipation.  Nausea.  Coughing or a sore throat.  Changes in how your medicines work in your body.  A depressed mood.  Difficulty sleeping (insomnia). After the first 2-3 weeks of quitting, you may start to notice more positive results, such as:  Improved sense of smell and taste.  Decreased coughing and sore throat.  Slower heart rate.  Lower blood pressure.  Clearer skin.  The ability to breathe more easily.  Fewer sick days. Quitting smoking is very challenging for most people. Do not get discouraged if you are not successful the first time. Some people need to make many attempts to quit before they achieve long-term success. Do your best to stick to your quit plan, and talk with your health care provider if you have any questions or concerns.   This information is not intended to replace advice given to you by your health care provider. Make sure you discuss any questions you have with your health care provider.   Document Released: 09/03/2001 Document Revised: 01/24/2015 Document Reviewed: 01/24/2015 Elsevier Interactive Patient Education 2016 Elsevier Inc.  

## 2016-07-09 ENCOUNTER — Telehealth: Payer: Self-pay

## 2016-07-09 NOTE — Telephone Encounter (Signed)
Pt. called to report awakening this morning and feeling like both inner thighs were "numb."  Then stated she got up to answer her door, and felt a "bee sting" type pain in the anterior left thigh.  Stated the sensation lasted 5 minutes, and was localized at site of blood vessel, beneath the skin. Denied any redness or warmth.  Reported the numbness of the bilat. inner thigh was in "varying degrees" today. Reported some swelling in the right groin, and some swelling of the lower legs.     Advised to continue to monitor the thighs for any changes.  Encouraged to elevate her legs, intermittently, above level of heart, to manage the swelling.  Verb. Understanding.  Encouraged to call if symptoms worsen.

## 2016-07-12 NOTE — Discharge Summary (Signed)
[facility] Discharge Summary   Patient ID:  Frances Graham MRN: [id number] DOB/AGE: 03/19/1966 50 y.o.  Admit date: 06/23/2016 Discharge date:07/03/2016  Date of Surgery: 06/23/2016 - 06/28/2016 Surgeon: Moishe Spice): Chuck Hint, MD  Admission Diagnosis: Peripheral vascular disease (HCC) [I73.9] Pre-operative clearance [Z01.818] Low back pain radiating to both legs [M54.5] Limb ischemia [I99.8]  Discharge Diagnoses:  Peripheral vascular disease (HCC) [I73.9] Pre-operative clearance [Z01.818] Low back pain radiating to both legs [M54.5] Limb ischemia [I99.8]  Secondary Diagnoses: Past Medical History:  Diagnosis Date  . Anxiety   . Attention deficit disorder   . Essential hypertension   . PAD (peripheral artery disease) (HCC)    Infrarenal aortic occlusion  . Temporomandibular joint disorder     Procedure(s): AORTOBIFEMORAL BYPASS GRAFT  Discharged Condition: good  HPI: Frances Graham is a 50 y.o. (1966/05/30) female who presents with chief complaint: R leg pain and loss of bladder control.  This patient was seen by Dr. Edilia Bo just recently on 06/18/16.  Since then the patient has developed R leg pain consistent with prior episodes of sciatica.  This pain has a neuropathic character to it with changing character involving sharp, burning, and paraesthesia.  She also notes weakness in her R leg and has fallen once recently.  I referred her to the ED for evaluation given the concern with sx suggestive of possible R acute limb ischemia.  The patient has had months of rest pain in right leg and R great toe ischemic skin changes also with skin color changes in sever of her L foot toe.  The patient notes a long standing history of back pain.    The patient's PMH, PSH, SH, and FamHx are unchanged from 06/18/16.  Radiology: CTA Abd/Pelvis with BRo (06/23/2016): official report pending  Based on my review of the CTA, this patient has an infrarenal  occlusion with what appears to be continue perfusion of IMA.  There is reconstitution of B EIA and all distally vascularature down to the level of the ankle.  Hospital Course:  Frances Graham is a 50 y.o. female is S/P   Cardiology consult for surgical clearance:  Stress test  IMPRESSION: 1. No reversible ischemia. However, the study has technical limitations due to a large amount of gut uptake and limited evaluation of the septal and inferoseptal walls as described.  2. Abnormal wall motion involving the septal and inferoseptal wall as described. This abnormal wall motion may be associated with the gut uptake. Wall motion in these areas are equivocal.  3. Left ventricular ejection fraction is 56%.  4. Non invasive risk stratification*: Low  06/26/2016   Patient seen and examined. Agree with assessment and plan. Pt has metabolic syndrome. Echo and Nuclear data reviewed with patients. She is given cardiac clearance for surgery. Long discussion with patient concerning aggressive lipid management with her mixed hyperlipidemia. She would also benefit from omega 3 FFA (lovaza or vascepa 2 cap bid) but can start at dc. Absolute tobacco cessation is essential.    Frances Bihari, MD, [facility] 06/26/2016 3:31 PM  06/28/2016 PROCEDURE: Aortobifemoral bypass graft (14 x 7 mm Dacron graft)  POD#1 Confused over night pulled IV and NG out.  Maintained NPO Replace K with IV fluids HGB stable 9.7 Cr stable 0.67, UO 1960 cc last 24 hours Transfer to 2w Incisional vacs to both groins continue Cipro for mild cellulitis right foot & gangrene of right great toe.  POD#3 Started on clear liquids  POD#5 Ambulating, tolerating full  diet Discharge home with Klonopin for anxiety, Keflex PO, and Percocet for pain management. Dial soap soaks daily for right great toe wounds      Consults:  Treatment Team:  Chuck Hinthristopher S Dickson, MD  Significant Diagnostic Studies: CBC Lab Results   Component Value Date   WBC 9.0 07/03/2016   HGB 8.8 (L) 07/03/2016   HCT 27.2 (L) 07/03/2016   MCV 96.1 07/03/2016   PLT 261 07/03/2016    BMET    Component Value Date/Time   NA 139 07/03/2016 0251   K 3.4 (L) 07/03/2016 0251   CL 105 07/03/2016 0251   CO2 24 07/03/2016 0251   GLUCOSE 90 07/03/2016 0251   BUN 6 07/03/2016 0251   CREATININE 0.57 07/03/2016 0251   CALCIUM 8.3 (L) 07/03/2016 0251   GFRNONAA >60 07/03/2016 0251   GFRAA >60 07/03/2016 0251   COAG Lab Results  Component Value Date   INR 1.15 06/28/2016   INR 1.08 06/23/2016   INR 1.00 06/18/2016     Disposition:  Discharge to :Home Discharge Instructions    Call MD for:  redness, tenderness, or signs of infection (pain, swelling, bleeding, redness, odor or green/yellow discharge around incision site)    Complete by:  As directed    Call MD for:  severe or increased pain, loss or decreased feeling  in affected limb(s)    Complete by:  As directed    Call MD for:  temperature >100.5    Complete by:  As directed    Discharge instructions    Complete by:  As directed    You may shower daily , dry groins well.   Driving Restrictions    Complete by:  As directed    No driving for 2 weeks   Increase activity slowly    Complete by:  As directed    Walk with assistance use walker or cane as needed   Lifting restrictions    Complete by:  As directed    No heavy lifting for 6 weeks   Resume previous diet    Complete by:  As directed        Medication List    TAKE these medications   clonazePAM 0.5 MG tablet Commonly known as:  KLONOPIN Take 1 tablet (0.5 mg total) by mouth 2 (two) times daily as needed (anxiety).   gabapentin 600 MG tablet Commonly known as:  NEURONTIN Take 600 mg by mouth 3 (three) times daily.   ibuprofen 200 MG tablet Commonly known as:  ADVIL,MOTRIN Take 800 mg by mouth 3 (three) times daily.   lisdexamfetamine 40 MG capsule Commonly known as:  VYVANSE Take 40 mg by  mouth daily as needed. ADD.   lisinopril-hydrochlorothiazide 20-25 MG tablet Commonly known as:  PRINZIDE,ZESTORETIC Take 1 tablet by mouth daily.   methocarbamol 500 MG tablet Commonly known as:  ROBAXIN Take 500 mg by mouth 3 (three) times daily. May take an extra 500 mg at night, as needed   zolpidem 5 MG tablet Commonly known as:  AMBIEN Take 1 tablet (5 mg total) by mouth at bedtime as needed for sleep.      Verbal and written Discharge instructions given to the patient. Wound care per Discharge AVS Follow-up Information    Waverly Ferrariickson, Christopher, MD Follow up in 2 week(s).   Specialties:  Vascular Surgery, Cardiology Why:  office will call Contact information: [address] [place] [address] [phone]        [facility] .   Why:  A 3:1 has been delivered to you. Call above number if you have any questions about your Durable Medical Equipment.  Contact information: [address] [place] [address] [phone]           Signed: Clinton Gallant [facility] 07/12/2016, 9:14 AM - For VQI Registry use --- Instructions: Press F2 to tab through selections.  Delete question if not applicable.   Post-op:  Time to Extubation: [x ] In OR, [ ]  <12 hrs, [ ]  12-24 hrs, [ ]  > 24 hrs Vasopressors: No  ICU Stay: 1 days  Transfusion: No  If yes, 0 units given New Arrhythmia: No Ipsilateral amputation: [x ] no, [ ]  Minor, [ ]  BKA, [ ]  AKA Discharge patency: [x ] Primary, [ ]  Primary assisted, [ ]  Secondary, [ ]  Occluded Patency judged by: [ ]  Dopper only, [ ]  Palpable graft pulse, [x ] Palpable distal pulse, [ ]  ABI inc. > 0.15, [ ]  Duplex  D/C Ambulatory Status: Ambulatory with Assistance  Complications: Wound complication: [x ] No, [ ]  Superficial, [ ]  Return to OR  Graft infection: No  Leg ischemia/emboli: [x ] No, [ ]  Yes, no Surgery, [ ]  Yes, Surgery req., [ ]  Amputation If amputation: side: [ ]  R: [ ]  minor, [ ]  BKA, [ ]  AKA; [ ]  L: [ ]   Minor, [ ]  BKA, [ ]  AKA  MI: [ x] No, [ ]  Troponin only, [ ]  EKG or Clinical CHF: No Resp failure: [x ] none, [ ]  Pneumonia, [ ]  Ventilator Chg in renal function: [x ] none, [ ]  Inc. Cr > 0.5, [ ]  Temp. Dialysis, [ ]  Permanent dialysis Stroke: [x ] None, [ ]  Minor, [ ]  Major Return to OR: No  Reason for return to OR: [ ]  Bleeding, [ ]  Infection, [ ]  Thrombosis, [ ]  Revision  Discharge medications: Statin use:  No  for medical reason   ASA use:  No  for medical reason   Plavix use:  No  for medical reason   Beta blocker use: No  for medical reason   Coumadin use: No  for medical reason

## 2016-07-13 ENCOUNTER — Emergency Department (HOSPITAL_COMMUNITY): Payer: Medicaid Other

## 2016-07-13 ENCOUNTER — Emergency Department (HOSPITAL_COMMUNITY)
Admission: EM | Admit: 2016-07-13 | Discharge: 2016-07-13 | Disposition: A | Discharge status: Home / Self Care | Payer: Medicaid Other | Attending: Emergency Medicine | Admitting: Emergency Medicine

## 2016-07-13 ENCOUNTER — Encounter (HOSPITAL_COMMUNITY): Payer: Self-pay | Admitting: Emergency Medicine

## 2016-07-13 DIAGNOSIS — Z87891 Personal history of nicotine dependence: Secondary | ICD-10-CM | POA: Insufficient documentation

## 2016-07-13 DIAGNOSIS — F909 Attention-deficit hyperactivity disorder, unspecified type: Secondary | ICD-10-CM | POA: Insufficient documentation

## 2016-07-13 DIAGNOSIS — S301XXA Contusion of abdominal wall, initial encounter: Secondary | ICD-10-CM

## 2016-07-13 DIAGNOSIS — I10 Essential (primary) hypertension: Secondary | ICD-10-CM | POA: Diagnosis not present

## 2016-07-13 DIAGNOSIS — R109 Unspecified abdominal pain: Secondary | ICD-10-CM | POA: Diagnosis not present

## 2016-07-13 DIAGNOSIS — G8918 Other acute postprocedural pain: Secondary | ICD-10-CM

## 2016-07-13 LAB — CBC WITH DIFFERENTIAL/PLATELET
BASOS PCT: 0 %
Basophils Absolute: 0 10*3/uL (ref 0.0–0.1)
EOS ABS: 0.1 10*3/uL (ref 0.0–0.7)
EOS PCT: 1 %
HCT: 36.4 % (ref 36.0–46.0)
Hemoglobin: 11.6 g/dL — ABNORMAL LOW (ref 12.0–15.0)
LYMPHS ABS: 1.9 10*3/uL (ref 0.7–4.0)
Lymphocytes Relative: 19 %
MCH: 30.4 pg (ref 26.0–34.0)
MCHC: 31.9 g/dL (ref 30.0–36.0)
MCV: 95.3 fL (ref 78.0–100.0)
MONO ABS: 0.9 10*3/uL (ref 0.1–1.0)
MONOS PCT: 9 %
Neutro Abs: 7.2 10*3/uL (ref 1.7–7.7)
Neutrophils Relative %: 71 %
PLATELETS: 359 10*3/uL (ref 150–400)
RBC: 3.82 MIL/uL — ABNORMAL LOW (ref 3.87–5.11)
RDW: 13.7 % (ref 11.5–15.5)
WBC: 10.1 10*3/uL (ref 4.0–10.5)

## 2016-07-13 LAB — COMPREHENSIVE METABOLIC PANEL
ALT: 16 U/L (ref 14–54)
AST: 22 U/L (ref 15–41)
Albumin: 3.1 g/dL — ABNORMAL LOW (ref 3.5–5.0)
Alkaline Phosphatase: 80 U/L (ref 38–126)
Anion gap: 9 (ref 5–15)
BILIRUBIN TOTAL: 0.3 mg/dL (ref 0.3–1.2)
BUN: 5 mg/dL — AB (ref 6–20)
CO2: 24 mmol/L (ref 22–32)
CREATININE: 0.54 mg/dL (ref 0.44–1.00)
Calcium: 9.1 mg/dL (ref 8.9–10.3)
Chloride: 107 mmol/L (ref 101–111)
Glucose, Bld: 109 mg/dL — ABNORMAL HIGH (ref 65–99)
Potassium: 4 mmol/L (ref 3.5–5.1)
Sodium: 140 mmol/L (ref 135–145)
TOTAL PROTEIN: 6 g/dL — AB (ref 6.5–8.1)

## 2016-07-13 LAB — URINALYSIS, ROUTINE W REFLEX MICROSCOPIC
BILIRUBIN URINE: NEGATIVE
Glucose, UA: NEGATIVE mg/dL
HGB URINE DIPSTICK: NEGATIVE
KETONES UR: NEGATIVE mg/dL
Leukocytes, UA: NEGATIVE
Nitrite: NEGATIVE
Protein, ur: NEGATIVE mg/dL
SPECIFIC GRAVITY, URINE: 1.018 (ref 1.005–1.030)
pH: 8.5 — ABNORMAL HIGH (ref 5.0–8.0)

## 2016-07-13 LAB — I-STAT BETA HCG BLOOD, ED (MC, WL, AP ONLY)

## 2016-07-13 LAB — I-STAT CG4 LACTIC ACID, ED: Lactic Acid, Venous: 1.59 mmol/L (ref 0.5–1.9)

## 2016-07-13 LAB — LIPASE, BLOOD: LIPASE: 20 U/L (ref 11–51)

## 2016-07-13 MED ORDER — SODIUM CHLORIDE 0.9 % IV BOLUS (SEPSIS)
1000.0000 mL | Freq: Once | INTRAVENOUS | Status: AC
  Administered 2016-07-13: 1000 mL via INTRAVENOUS

## 2016-07-13 MED ORDER — KETOROLAC TROMETHAMINE 30 MG/ML IJ SOLN
30.0000 mg | Freq: Once | INTRAMUSCULAR | Status: AC
  Administered 2016-07-13: 30 mg via INTRAVENOUS
  Filled 2016-07-13: qty 1

## 2016-07-13 MED ORDER — HYDROMORPHONE HCL 2 MG/ML IJ SOLN
1.0000 mg | Freq: Once | INTRAMUSCULAR | Status: AC
  Administered 2016-07-13: 1 mg via INTRAVENOUS
  Filled 2016-07-13: qty 1

## 2016-07-13 MED ORDER — SODIUM CHLORIDE 0.9 % IV SOLN: INTRAVENOUS | Status: DC

## 2016-07-13 MED ORDER — MORPHINE SULFATE (PF) 4 MG/ML IV SOLN
4.0000 mg | Freq: Once | INTRAVENOUS | Status: AC
  Administered 2016-07-13: 4 mg via INTRAVENOUS
  Filled 2016-07-13: qty 1

## 2016-07-13 MED ORDER — ONDANSETRON HCL 4 MG/2ML IJ SOLN
4.0000 mg | Freq: Once | INTRAMUSCULAR | Status: AC
  Administered 2016-07-13: 4 mg via INTRAVENOUS
  Filled 2016-07-13: qty 2

## 2016-07-13 MED ORDER — IOPAMIDOL (ISOVUE-300) INJECTION 61%
INTRAVENOUS | Status: AC
  Administered 2016-07-13: 100 mL
  Filled 2016-07-13: qty 100

## 2016-07-13 NOTE — ED Triage Notes (Signed)
Pt from home via [facility] with c/o worsening abdominal and back pain starting yesterday s/p arotoiliac bypass surgery 10 days ago.  Pt reports fever and chills starting yesterday with unknown temperature.  External surgical wound appears in good condition.  NAD, A&O.

## 2016-07-13 NOTE — ED Notes (Signed)
Pt taken to CT.

## 2016-07-13 NOTE — ED Provider Notes (Signed)
[facility]-EMERGENCY DEPT Provider Note   CSN: [id number] Arrival date & time: 07/13/16  1203     History   Chief Complaint Chief Complaint  Patient presents with  . Post-op Problem    HPI Frances Graham is a 50 y.o. female.  Pt presents to the ED today via EMS because of abdominal pain.  The pt had an aortobifem bypass on 10/6 by Dr. 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  The pt said that she's had abdominal pain for the past few days.  She said she is having BM.  She did get 30 oxy filled on 10/11 and 20 filled on 10/16.  Pt denies f/c or vomiting.  Pt said she had chills all night and just feels "bad."      Past Medical History:  Diagnosis Date  . Anxiety   . Attention deficit disorder   . Essential hypertension   . PAD (peripheral artery disease) (HCC)    Infrarenal aortic occlusion  . Temporomandibular joint disorder     Patient Active Problem List   Diagnosis Date Noted  . Pre-operative clearance   . Mixed hyperlipidemia   . Peripheral vascular disease (HCC) 06/23/2016  . Aortoiliac occlusive disease (HCC) 06/23/2016  . Acute kidney injury (HCC) 06/23/2016  . Essential hypertension 06/23/2016  . Ischemic ulcer of foot (HCC) 06/23/2016  . Cardiovascular risk factor 06/23/2016  . Leukocytosis 06/23/2016  . Tobacco abuse 06/23/2016  . Chronic low back pain 06/23/2016  . ADD (attention deficit disorder) 06/23/2016    Past Surgical History:  Procedure Laterality Date  . AORTA - BILATERAL FEMORAL ARTERY BYPASS GRAFT Bilateral 06/28/2016   Procedure: AORTOBIFEMORAL BYPASS GRAFT;  Surgeon: Chuck Hint, MD;  Location: [facility];  Service: Vascular;  Laterality: Bilateral;  . MENISCUS REPAIR Left     OB History    No data available       Home Medications    Prior to Admission medications   Medication Sig Start Date End Date Taking? Authorizing Provider  gabapentin (NEURONTIN) 600 MG tablet Take 600 mg by mouth 3 (three)  times daily.   Yes Historical Provider, MD  ibuprofen (ADVIL,MOTRIN) 200 MG tablet Take 800 mg by mouth 3 (three) times daily.    Yes Historical Provider, MD  lisdexamfetamine (VYVANSE) 40 MG capsule Take 40 mg by mouth daily as needed. ADD.   Yes Historical Provider, MD  lisinopril-hydrochlorothiazide (PRINZIDE,ZESTORETIC) 20-25 MG tablet Take 1 tablet by mouth daily.   Yes Historical Provider, MD  clonazePAM (KLONOPIN) 0.5 MG tablet Take 1 tablet (0.5 mg total) by mouth 2 (two) times daily as needed (anxiety). Patient not taking: Reported on 07/13/2016 07/03/16   Lars Mage, PA-C  methocarbamol (ROBAXIN) 500 MG tablet Take 500 mg by mouth 3 (three) times daily. May take an extra 500 mg at night, as needed    Historical Provider, MD  oxyCODONE-acetaminophen (PERCOCET/ROXICET) 5-325 MG tablet Take 1-2 tablets by mouth every 6 (six) hours as needed for moderate pain. Patient not taking: Reported on 07/13/2016 07/08/16   Carma Lair Nickel, NP  zolpidem (AMBIEN) 5 MG tablet Take 1 tablet (5 mg total) by mouth at bedtime as needed for sleep. Patient not taking: Reported on 07/13/2016 07/03/16   Lars Mage, PA-C    Family History Family History  Problem Relation Age of Onset  . COPD Mother   . CAD Mother   . CAD Maternal Grandmother   . Diabetes Maternal Uncle     Social  History Social History  Substance Use Topics  . Smoking status: Former Smoker    Packs/day: 0.25    Types: Cigarettes    Quit date: 06/21/2016  . Smokeless tobacco: Never Used  . Alcohol use No     Allergies   Penicillins; Benadryl [diphenhydramine hcl]; Codeine; Celebrex [celecoxib]; Keflex [cephalexin]; and Lidocaine   Review of Systems Review of Systems  Gastrointestinal: Positive for abdominal pain.  All other systems reviewed and are negative.    Physical Exam Updated Vital Signs BP 145/78   Pulse 86   Temp 98.4 F (36.9 C) (Rectal)   Resp (!) 8   Ht 5\' 7"  (1.702 m)   Wt 223 lb (101.2 kg)    LMP 06/14/2016 (Exact Date)   SpO2 96%   BMI 34.93 kg/m   Physical Exam  Constitutional: She is oriented to person, place, and time. She appears Graham-developed and Graham-nourished.  HENT:  Head: Normocephalic and atraumatic.  Right Ear: External ear normal.  Left Ear: External ear normal.  Nose: Nose normal.  Mouth/Throat: Oropharynx is clear and moist.  Eyes: Conjunctivae and EOM are normal. Pupils are equal, round, and reactive to light.  Neck: Normal range of motion. Neck supple.  Cardiovascular: Normal rate, regular rhythm, normal heart sounds and intact distal pulses.   Pulmonary/Chest: Effort normal and breath sounds normal.  Abdominal: Soft. Bowel sounds are decreased.  Midline wound is healing Graham.  No signs of infection.  Musculoskeletal: Normal range of motion.  Neurological: She is alert and oriented to person, place, and time.  Skin: Skin is warm.  Psychiatric: She has a normal mood and affect. Her behavior is normal. Judgment and thought content normal.  Nursing note and vitals reviewed.    ED Treatments / Results  Labs (all labs ordered are listed, but only abnormal results are displayed) Labs Reviewed  COMPREHENSIVE METABOLIC PANEL - Abnormal; Notable for the following:       Result Value   Glucose, Bld 109 (*)    BUN 5 (*)    Total Protein 6.0 (*)    Albumin 3.1 (*)    All other components within normal limits  CBC WITH DIFFERENTIAL/PLATELET - Abnormal; Notable for the following:    RBC 3.82 (*)    Hemoglobin 11.6 (*)    All other components within normal limits  URINALYSIS, ROUTINE W REFLEX MICROSCOPIC (NOT [facility]) - Abnormal; Notable for the following:    Color, Urine AMBER (*)    APPearance CLOUDY (*)    pH 8.5 (*)    All other components within normal limits  CULTURE, BLOOD (ROUTINE X 2)  CULTURE, BLOOD (ROUTINE X 2)  URINE CULTURE  LIPASE, BLOOD  I-STAT BETA HCG BLOOD, ED ([facility], [facility], [facility] ONLY)  I-STAT CG4 LACTIC ACID, ED    EKG  EKG  Interpretation None       Radiology Ct Abdomen Pelvis W Contrast  Result Date: 07/13/2016 CLINICAL DATA:  MID ABD PAIN RADIATING TO UMBILICUS AND BACKNAUSEA AND CHILLSPATIENT s/p arotoiliac bypass surgery 10 days ago. Pt reports fever and chills starting yesterday EXAM: CT ABDOMEN AND PELVIS WITH CONTRAST TECHNIQUE: Multidetector CT imaging of the abdomen and pelvis was performed using the standard protocol following bolus administration of intravenous contrast. CONTRAST:  100mL ISOVUE-300 IOPAMIDOL (ISOVUE-300) INJECTION 61% COMPARISON:  CT, 08/08/2013 FINDINGS: Lower chest: No acute abnormality. Hepatobiliary: 7 mm focus of enhancement at the dome of the lateral segment of the left liver lobe. This may reflect a hemangioma. Small  area of focal fatty infiltration adjacent to the falciform ligament. No other liver abnormality. 1 cm gallstone. No gallbladder wall thickening or inflammation. No bile duct dilation. Pancreas: Unremarkable. No pancreatic ductal dilatation or surrounding inflammatory changes. Spleen: Normal in size without focal abnormality. Adrenals/Urinary Tract: Adrenal glands are unremarkable. Kidneys are normal, without renal calculi, focal lesion, or hydronephrosis. Bladder is unremarkable. Stomach/Bowel: Stomach is within normal limits. Appendix appears normal. No evidence of bowel wall thickening, distention, or inflammatory changes. Vascular/Lymphatic: Aortoiliac grafts are widely patent. No pathologically enlarged lymph nodes. Reproductive: Uterus and bilateral adnexa are unremarkable. Other: There is a focal fluid collection containing a single bubble of air within the right inguinal subcutaneous fat measuring 3.2 x 2.0 cm transversely. There is a small focal area of fluid along the anterior upper abdominal incision measuring 3.2 x 2.1 x 2.3 cm. A smaller area of fluid is noted along the inferior aspect of the abdominal incision. Musculoskeletal: No acute or significant osseous  findings. IMPRESSION: 1. Small focal fluid collections are noted along the anterior abdominal wall incision line as Graham as within the right groin. These may reflect sterile postoperative fluid collections. Abscess is possible. 2. Aortoiliac bypass graft is widely patent. No evidence of an intra-abdominal abscess. 3. Gallstone.  No acute cholecystitis. Electronically Signed   By: Amie Portland M.D.   On: 07/13/2016 13:51   Pt does have some swelling in her right groin under her incision, but it is not red or inflamed.  Pt does not have pain in her RUQ, so she clinically does not have cholecystitis.  Procedures Procedures (including critical care time)  Medications Ordered in ED Medications  sodium chloride 0.9 % bolus 1,000 mL (0 mLs Intravenous Stopped 07/13/16 1550)  ondansetron (ZOFRAN) injection 4 mg (4 mg Intravenous Given 07/13/16 1236)  morphine 4 MG/ML injection 4 mg (4 mg Intravenous Given 07/13/16 1236)  iopamidol (ISOVUE-300) 61 % injection (100 mLs  Contrast Given 07/13/16 1322)  HYDROmorphone (DILAUDID) injection 1 mg (1 mg Intravenous Given 07/13/16 1555)  ketorolac (TORADOL) 30 MG/ML injection 30 mg (30 mg Intravenous Given 07/13/16 1559)     Initial Impression / Assessment and Plan / ED Course  I have reviewed the triage vital signs and the nursing notes.  Pertinent labs & imaging results that were available during my care of the patient were reviewed by me and considered in my medical decision making (see chart for details).  Clinical Course   Pt with abdominal pain.  CT looks ok.  Pt knows to f/u with pcp and with Dr. Edilia Bo.   Final Clinical Impressions(s) / ED Diagnoses   Final diagnoses:  Post-operative pain  Abdominal wall seroma, initial encounter [facility])    New Prescriptions Discharge Medication List as of 07/13/2016  4:16 PM       Jacalyn Lefevre, MD 07/14/16 [phone]

## 2016-07-13 NOTE — ED Notes (Signed)
Pt verbalized understanding of d/c instructions and has no further questions. Pt stable and NAD. Pt to follow up with cardiovascular surgeon this week. VSS. Pt alert and oriented x 4.

## 2016-07-15 LAB — URINE CULTURE

## 2016-07-18 ENCOUNTER — Encounter: Payer: Self-pay | Admitting: Vascular Surgery

## 2016-07-18 LAB — CULTURE, BLOOD (ROUTINE X 2)
CULTURE: NO GROWTH
CULTURE: NO GROWTH

## 2016-07-24 ENCOUNTER — Other Ambulatory Visit: Payer: Self-pay

## 2016-07-24 ENCOUNTER — Ambulatory Visit (INDEPENDENT_AMBULATORY_CARE_PROVIDER_SITE_OTHER): Payer: Self-pay | Admitting: Vascular Surgery

## 2016-07-24 ENCOUNTER — Encounter: Payer: Self-pay | Admitting: Vascular Surgery

## 2016-07-24 VITALS — BP 94/64 | HR 122 | Temp 98.1°F | Ht 67.0 in | Wt 216.4 lb

## 2016-07-24 DIAGNOSIS — I7410 Embolism and thrombosis of unspecified parts of aorta: Secondary | ICD-10-CM

## 2016-07-24 DIAGNOSIS — I700 Atherosclerosis of aorta: Secondary | ICD-10-CM

## 2016-07-24 NOTE — Progress Notes (Signed)
POST OPERATIVE OFFICE NOTE    CC:  F/u for surgery  HPI:  This is a 50 y.o. female who is s/p Aortobifemoral bypass graft (14 x 7 mm Dacron graft).  She was discharged 10/;07/2016 with instructions to  continue Cipro for mild cellulitis right foot & gangrene of right great toe.  She was unable to take the Keflex due to an allergy.  Her toe continues to be very painful and she has to hang he foot off the side of the bed at night to help with the pain.  At this point she wants to have the toe amputated to stop the pain.  She is here to discuss this with Dr. Edilia Boickson.    As a note she was seen in the ED 07/13/2016 secondary to a stomach virus.  She has completely covered from that at this time.    Allergies  Allergen Reactions  . Penicillins Anaphylaxis    Has patient had a PCN reaction causing immediate rash, facial/tongue/throat swelling, SOB or lightheadedness with hypotension: Yes Has patient had a PCN reaction causing severe rash involving mucus membranes or skin necrosis: No Has patient had a PCN reaction that required hospitalization Yes Has patient had a PCN reaction occurring within the last 10 years: No If all of the above answers are "NO", then may proceed with Cephalosporin use.   . Benadryl [Diphenhydramine Hcl] Other (See Comments)    syncope  . Codeine     Reflux. Pt states not allergic to hydrocodone and oxycodone .  Marland Kitchen. Celebrex [Celecoxib] Nausea And Vomiting and Rash  . Keflex [Cephalexin] Nausea And Vomiting and Rash    Projectile vomiting, thrush, fever blisters, ED visit  . Lidocaine Swelling and Rash    Foot swelled up like a balloon    Current Outpatient Prescriptions  Medication Sig Dispense Refill  . gabapentin (NEURONTIN) 600 MG tablet Take 600 mg by mouth 3 (three) times daily.    Marland Kitchen. ibuprofen (ADVIL,MOTRIN) 800 MG tablet Take 800 mg by mouth every 8 (eight) hours as needed.    Marland Kitchen. lisinopril-hydrochlorothiazide (PRINZIDE,ZESTORETIC) 20-25 MG tablet Take 1  tablet by mouth daily.    . clonazePAM (KLONOPIN) 0.5 MG tablet Take 1 tablet (0.5 mg total) by mouth 2 (two) times daily as needed (anxiety). (Patient not taking: Reported on 07/24/2016) 15 tablet 0  . lisdexamfetamine (VYVANSE) 40 MG capsule Take 40 mg by mouth daily as needed. ADD.    Marland Kitchen. methocarbamol (ROBAXIN) 500 MG tablet Take 500 mg by mouth 3 (three) times daily. May take an extra 500 mg at night, as needed    . oxyCODONE-acetaminophen (PERCOCET/ROXICET) 5-325 MG tablet Take 1-2 tablets by mouth every 6 (six) hours as needed for moderate pain. (Patient not taking: Reported on 07/24/2016) 20 tablet 0  . zolpidem (AMBIEN) 5 MG tablet Take 1 tablet (5 mg total) by mouth at bedtime as needed for sleep. (Patient not taking: Reported on 07/24/2016) 10 tablet 0   No current facility-administered medications for this visit.      ROS:  See HPI  Physical Exam:  Vitals:   07/24/16 1442  BP: 94/64  Pulse: (!) 122  Temp: 98.1 F (36.7 C)   CT ABD/Pelvis 07/13/2016 IMPRESSION: 1. Small focal fluid collections are noted along the anterior abdominal wall incision line as well as within the right groin. These may reflect sterile postoperative fluid collections. Abscess is possible. 2. Aortoiliac bypass graft is widely patent. No evidence of an intra-abdominal abscess. 3. Gallstone.  No acute cholecystitis.   Incision:  Abdomin soft incision healing well.  Positive BS.  B groins soft and healing well.  Right groin with superficial incisional wound.  No erythema or drainage. Extremities:  Palpable PT 2+ pulses.  Right great toe dry gangrene.  2-5th toes with erythema and superficial wounds.  Active range of motion of toes intact.   Assessment/Plan:  This is a 50 y.o. female who is s/p:Aortobifemoral bypass graft (14 x 7 mm Dacron graft) right great toe dry gangrene.  We will schedule her for right great toe amputation Thursday 08/01/2016 by Dr. Edilia Boickson.         Frances Graham, Frances Graham  [facility] [phone]  Clinic MD:  Pt seen and examined with Dr. Edilia Boickson

## 2016-07-30 ENCOUNTER — Encounter (HOSPITAL_COMMUNITY): Payer: Self-pay | Admitting: *Deleted

## 2016-07-31 NOTE — Anesthesia Preprocedure Evaluation (Addendum)
Anesthesia Evaluation  Patient identified by MRN, date of birth, ID band Patient awake    Reviewed: Allergy & Precautions, H&P , NPO status , Patient's Chart, lab work & pertinent test results  Airway Mallampati: I  TM Distance: >3 FB Neck ROM: Full    Dental  (+) Poor Dentition, Dental Advisory Given   Pulmonary neg pulmonary ROS, Current Smoker, former smoker,    Pulmonary exam normal        Cardiovascular hypertension, Pt. on medications + Peripheral Vascular Disease  Normal cardiovascular exam  Study Conclusions  - Left ventricle: The cavity size was normal. Systolic function was   normal. The estimated ejection fraction was in the range of 60%   to 65%. Wall motion was normal; there were no regional wall   motion abnormalities. Left ventricular diastolic function   parameters were normal. - Aortic valve: Trileaflet; normal thickness, mildly calcified   leaflets.   Neuro/Psych Anxiety negative neurological ROS  negative psych ROS   GI/Hepatic negative GI ROS, Neg liver ROS,   Endo/Other  negative endocrine ROS  Renal/GU negative Renal ROS  negative genitourinary   Musculoskeletal   Abdominal   Peds  (+) ADHD Hematology negative hematology ROS (+)   Anesthesia Other Findings   Reproductive/Obstetrics negative OB ROS                            Anesthesia Physical  Anesthesia Plan  ASA: III  Anesthesia Plan: General   Post-op Pain Management:    Induction: Intravenous  Airway Management Planned: LMA  Additional Equipment:   Intra-op Plan:   Post-operative Plan: Extubation in OR  Informed Consent: I have reviewed the patients History and Physical, chart, labs and discussed the procedure including the risks, benefits and alternatives for the proposed anesthesia with the patient or authorized representative who has indicated his/her understanding and acceptance.   Dental  advisory given  Plan Discussed with: CRNA, Anesthesiologist and Surgeon  Anesthesia Plan Comments:       Anesthesia Quick Evaluation

## 2016-08-01 ENCOUNTER — Encounter (HOSPITAL_COMMUNITY)
Admission: RE | Disposition: A | Discharge status: Home / Self Care | Payer: Self-pay | Source: Ambulatory Visit | Attending: Vascular Surgery

## 2016-08-01 ENCOUNTER — Observation Stay (HOSPITAL_COMMUNITY)
Admission: RE | Admit: 2016-08-01 | Discharge: 2016-08-03 | Disposition: A | Discharge status: Home / Self Care | Payer: Medicaid Other | Source: Ambulatory Visit | Attending: Vascular Surgery | Admitting: Vascular Surgery

## 2016-08-01 ENCOUNTER — Ambulatory Visit (HOSPITAL_COMMUNITY): Payer: Medicaid Other | Admitting: Anesthesiology

## 2016-08-01 ENCOUNTER — Encounter (HOSPITAL_COMMUNITY): Payer: Self-pay | Admitting: *Deleted

## 2016-08-01 DIAGNOSIS — I96 Gangrene, not elsewhere classified: Secondary | ICD-10-CM

## 2016-08-01 DIAGNOSIS — I10 Essential (primary) hypertension: Secondary | ICD-10-CM | POA: Diagnosis not present

## 2016-08-01 DIAGNOSIS — L02211 Cutaneous abscess of abdominal wall: Secondary | ICD-10-CM | POA: Diagnosis not present

## 2016-08-01 DIAGNOSIS — F419 Anxiety disorder, unspecified: Secondary | ICD-10-CM | POA: Diagnosis not present

## 2016-08-01 DIAGNOSIS — I959 Hypotension, unspecified: Secondary | ICD-10-CM | POA: Insufficient documentation

## 2016-08-01 DIAGNOSIS — Z87891 Personal history of nicotine dependence: Secondary | ICD-10-CM | POA: Insufficient documentation

## 2016-08-01 DIAGNOSIS — Z880 Allergy status to penicillin: Secondary | ICD-10-CM | POA: Insufficient documentation

## 2016-08-01 DIAGNOSIS — L03115 Cellulitis of right lower limb: Secondary | ICD-10-CM | POA: Diagnosis not present

## 2016-08-01 DIAGNOSIS — I70261 Atherosclerosis of native arteries of extremities with gangrene, right leg: Secondary | ICD-10-CM | POA: Diagnosis not present

## 2016-08-01 HISTORY — DX: Unspecified asthma, uncomplicated: J45.909

## 2016-08-01 HISTORY — PX: AMPUTATION: SHX166

## 2016-08-01 HISTORY — PX: AMPUTATION TOE: SHX6595

## 2016-08-01 LAB — COMPREHENSIVE METABOLIC PANEL
ALBUMIN: 3.3 g/dL — AB (ref 3.5–5.0)
ALK PHOS: 75 U/L (ref 38–126)
ALT: 15 U/L (ref 14–54)
AST: 20 U/L (ref 15–41)
Anion gap: 10 (ref 5–15)
BILIRUBIN TOTAL: 0.4 mg/dL (ref 0.3–1.2)
BUN: 17 mg/dL (ref 6–20)
CALCIUM: 9.2 mg/dL (ref 8.9–10.3)
CO2: 23 mmol/L (ref 22–32)
Chloride: 102 mmol/L (ref 101–111)
Creatinine, Ser: 0.84 mg/dL (ref 0.44–1.00)
GFR calc Af Amer: >60 mL/min (ref >60)
GLUCOSE: 94 mg/dL (ref 65–99)
Potassium: 4.2 mmol/L (ref 3.5–5.1)
Sodium: 135 mmol/L (ref 135–145)
TOTAL PROTEIN: 6.2 g/dL — AB (ref 6.5–8.1)

## 2016-08-01 LAB — CBC
HEMATOCRIT: 36.7 % (ref 36.0–46.0)
HEMOGLOBIN: 11.9 g/dL — AB (ref 12.0–15.0)
MCH: 30.7 pg (ref 26.0–34.0)
MCHC: 32.4 g/dL (ref 30.0–36.0)
MCV: 94.8 fL (ref 78.0–100.0)
Platelets: 264 10*3/uL (ref 150–400)
RBC: 3.87 MIL/uL (ref 3.87–5.11)
RDW: 13.5 % (ref 11.5–15.5)
WBC: 10.7 10*3/uL — AB (ref 4.0–10.5)

## 2016-08-01 LAB — SURGICAL PCR SCREEN
MRSA, PCR: NEGATIVE
Staphylococcus aureus: POSITIVE — AB

## 2016-08-01 LAB — HCG, SERUM, QUALITATIVE: PREG SERUM: NEGATIVE

## 2016-08-01 SURGERY — AMPUTATION DIGIT
Anesthesia: General | Site: Toe | Laterality: Right

## 2016-08-01 MED ORDER — KCL IN DEXTROSE-NACL 20-5-0.45 MEQ/L-%-% IV SOLN
INTRAVENOUS | Status: DC
  Administered 2016-08-01 – 2016-08-02 (×2): via INTRAVENOUS
  Filled 2016-08-01 (×4): qty 1000

## 2016-08-01 MED ORDER — HYDROMORPHONE HCL 1 MG/ML IJ SOLN
INTRAMUSCULAR | Status: AC
  Administered 2016-08-01: 09:00:00
  Filled 2016-08-01: qty 1

## 2016-08-01 MED ORDER — MIDAZOLAM HCL 5 MG/5ML IJ SOLN
INTRAMUSCULAR | Status: DC | PRN
  Administered 2016-08-01: 2 mg via INTRAVENOUS

## 2016-08-01 MED ORDER — OXYCODONE-ACETAMINOPHEN 5-325 MG PO TABS
1.0000 | ORAL_TABLET | ORAL | Status: DC | PRN
  Administered 2016-08-01 – 2016-08-03 (×9): 2 via ORAL
  Filled 2016-08-01 (×8): qty 2

## 2016-08-01 MED ORDER — SODIUM CHLORIDE 0.9 % IV SOLN
INTRAVENOUS | Status: DC
  Administered 2016-08-01 (×2): via INTRAVENOUS

## 2016-08-01 MED ORDER — METOPROLOL TARTRATE 5 MG/5ML IV SOLN: 2.0000 mg | INTRAVENOUS | Status: DC | PRN

## 2016-08-01 MED ORDER — HYDRALAZINE HCL 20 MG/ML IJ SOLN: 5.0000 mg | INTRAMUSCULAR | Status: DC | PRN

## 2016-08-01 MED ORDER — MUPIROCIN 2 % EX OINT
1.0000 "application " | TOPICAL_OINTMENT | Freq: Once | CUTANEOUS | Status: AC
  Administered 2016-08-01: 1 via TOPICAL

## 2016-08-01 MED ORDER — ALUM & MAG HYDROXIDE-SIMETH 200-200-20 MG/5ML PO SUSP: 15.0000 mL | ORAL | Status: DC | PRN

## 2016-08-01 MED ORDER — ONDANSETRON HCL 4 MG/2ML IJ SOLN
4.0000 mg | Freq: Four times a day (QID) | INTRAMUSCULAR | Status: DC | PRN
Start: 2016-08-01 — End: 2016-08-03

## 2016-08-01 MED ORDER — PHENOL 1.4 % MT LIQD: 1.0000 | OROMUCOSAL | Status: DC | PRN

## 2016-08-01 MED ORDER — ACETAMINOPHEN 325 MG RE SUPP: 325.0000 mg | RECTAL | Status: DC | PRN

## 2016-08-01 MED ORDER — PHENYLEPHRINE HCL 10 MG/ML IJ SOLN
INTRAMUSCULAR | Status: DC | PRN
  Administered 2016-08-01: 40 ug via INTRAVENOUS
  Administered 2016-08-01 (×2): 80 ug via INTRAVENOUS

## 2016-08-01 MED ORDER — METHOCARBAMOL 500 MG PO TABS
500.0000 mg | ORAL_TABLET | Freq: Three times a day (TID) | ORAL | Status: DC
  Administered 2016-08-01 – 2016-08-02 (×5): 500 mg via ORAL
  Filled 2016-08-01 (×5): qty 1

## 2016-08-01 MED ORDER — VANCOMYCIN HCL IN DEXTROSE 1-5 GM/200ML-% IV SOLN
INTRAVENOUS | Status: AC
  Filled 2016-08-01: qty 200

## 2016-08-01 MED ORDER — 0.9 % SODIUM CHLORIDE (POUR BTL) OPTIME
TOPICAL | Status: DC | PRN
  Administered 2016-08-01: 1000 mL

## 2016-08-01 MED ORDER — LISINOPRIL-HYDROCHLOROTHIAZIDE 20-25 MG PO TABS: 1.0000 | ORAL_TABLET | Freq: Every day | ORAL | Status: DC

## 2016-08-01 MED ORDER — HYDROCHLOROTHIAZIDE 25 MG PO TABS
25.0000 mg | ORAL_TABLET | Freq: Every day | ORAL | Status: DC
  Administered 2016-08-01: 25 mg via ORAL
  Filled 2016-08-01: qty 1

## 2016-08-01 MED ORDER — CHLORHEXIDINE GLUCONATE CLOTH 2 % EX PADS: 6.0000 | MEDICATED_PAD | Freq: Once | CUTANEOUS | Status: DC

## 2016-08-01 MED ORDER — FENTANYL CITRATE (PF) 250 MCG/5ML IJ SOLN
INTRAMUSCULAR | Status: AC
  Filled 2016-08-01: qty 5

## 2016-08-01 MED ORDER — GUAIFENESIN-DM 100-10 MG/5ML PO SYRP: 15.0000 mL | ORAL_SOLUTION | ORAL | Status: DC | PRN

## 2016-08-01 MED ORDER — FENTANYL CITRATE (PF) 100 MCG/2ML IJ SOLN
INTRAMUSCULAR | Status: DC | PRN
  Administered 2016-08-01 (×5): 50 ug via INTRAVENOUS

## 2016-08-01 MED ORDER — MUPIROCIN 2 % EX OINT
TOPICAL_OINTMENT | CUTANEOUS | Status: AC
  Filled 2016-08-01: qty 22

## 2016-08-01 MED ORDER — PROMETHAZINE HCL 25 MG/ML IJ SOLN: 6.2500 mg | INTRAMUSCULAR | Status: DC | PRN

## 2016-08-01 MED ORDER — GABAPENTIN 600 MG PO TABS
600.0000 mg | ORAL_TABLET | Freq: Three times a day (TID) | ORAL | Status: DC
  Administered 2016-08-01 – 2016-08-02 (×5): 600 mg via ORAL
  Filled 2016-08-01 (×5): qty 1

## 2016-08-01 MED ORDER — MIDAZOLAM HCL 2 MG/2ML IJ SOLN
INTRAMUSCULAR | Status: AC
  Filled 2016-08-01: qty 2

## 2016-08-01 MED ORDER — IBUPROFEN 800 MG PO TABS
800.0000 mg | ORAL_TABLET | Freq: Three times a day (TID) | ORAL | Status: DC | PRN
  Administered 2016-08-01 – 2016-08-02 (×3): 800 mg via ORAL
  Filled 2016-08-01 (×3): qty 1

## 2016-08-01 MED ORDER — VANCOMYCIN HCL IN DEXTROSE 1-5 GM/200ML-% IV SOLN
1000.0000 mg | INTRAVENOUS | Status: AC
  Administered 2016-08-01: 1000 mg via INTRAVENOUS

## 2016-08-01 MED ORDER — ACETAMINOPHEN 325 MG PO TABS: 325.0000 mg | ORAL_TABLET | ORAL | Status: DC | PRN

## 2016-08-01 MED ORDER — BACITRACIN ZINC 500 UNIT/GM EX OINT
TOPICAL_OINTMENT | CUTANEOUS | Status: AC
  Filled 2016-08-01: qty 28.35

## 2016-08-01 MED ORDER — BUPIVACAINE HCL 0.5 % IJ SOLN
INTRAMUSCULAR | Status: AC
  Filled 2016-08-01: qty 1

## 2016-08-01 MED ORDER — LISINOPRIL 10 MG PO TABS
20.0000 mg | ORAL_TABLET | Freq: Every day | ORAL | Status: DC
  Administered 2016-08-01: 20 mg via ORAL
  Filled 2016-08-01: qty 2

## 2016-08-01 MED ORDER — PROPOFOL 10 MG/ML IV BOLUS
INTRAVENOUS | Status: AC
  Filled 2016-08-01: qty 40

## 2016-08-01 MED ORDER — PROPOFOL 10 MG/ML IV BOLUS
INTRAVENOUS | Status: DC | PRN
  Administered 2016-08-01: 200 mg via INTRAVENOUS
  Administered 2016-08-01: 60 mg via INTRAVENOUS

## 2016-08-01 MED ORDER — ENOXAPARIN SODIUM 40 MG/0.4ML ~~LOC~~ SOLN
40.0000 mg | SUBCUTANEOUS | Status: DC
  Administered 2016-08-01 – 2016-08-02 (×2): 40 mg via SUBCUTANEOUS
  Filled 2016-08-01 (×2): qty 0.4

## 2016-08-01 MED ORDER — POTASSIUM CHLORIDE CRYS ER 20 MEQ PO TBCR: 20.0000 meq | EXTENDED_RELEASE_TABLET | Freq: Once | ORAL | Status: DC

## 2016-08-01 MED ORDER — BUPIVACAINE HCL 0.5 % IJ SOLN
INTRAMUSCULAR | Status: DC | PRN
  Administered 2016-08-01: 10 mL

## 2016-08-01 MED ORDER — LABETALOL HCL 5 MG/ML IV SOLN: 10.0000 mg | INTRAVENOUS | Status: DC | PRN

## 2016-08-01 MED ORDER — PANTOPRAZOLE SODIUM 40 MG PO TBEC
40.0000 mg | DELAYED_RELEASE_TABLET | Freq: Every day | ORAL | Status: DC
  Administered 2016-08-01 – 2016-08-02 (×2): 40 mg via ORAL
  Filled 2016-08-01 (×2): qty 1

## 2016-08-01 MED ORDER — ONDANSETRON HCL 4 MG/2ML IJ SOLN
INTRAMUSCULAR | Status: DC | PRN
  Administered 2016-08-01: 4 mg via INTRAVENOUS

## 2016-08-01 MED ORDER — OXYCODONE-ACETAMINOPHEN 5-325 MG PO TABS
ORAL_TABLET | ORAL | Status: AC
  Filled 2016-08-01: qty 2

## 2016-08-01 MED ORDER — HYDROMORPHONE HCL 1 MG/ML IJ SOLN
0.2500 mg | INTRAMUSCULAR | Status: DC | PRN
  Administered 2016-08-01 (×2): 0.5 mg via INTRAVENOUS

## 2016-08-01 SURGICAL SUPPLY — 35 items
BANDAGE ACE 4X5 VEL STRL LF (GAUZE/BANDAGES/DRESSINGS) ×3 IMPLANT
BLADE AVERAGE 25MMX9MM (BLADE) ×1
BLADE AVERAGE 25X9 (BLADE) ×1 IMPLANT
BNDG CONFORM 3 STRL LF (GAUZE/BANDAGES/DRESSINGS) ×1 IMPLANT
BNDG GAUZE ELAST 4 BULKY (GAUZE/BANDAGES/DRESSINGS) ×3 IMPLANT
CANISTER SUCTION 2500CC (MISCELLANEOUS) ×3 IMPLANT
COVER SURGICAL LIGHT HANDLE (MISCELLANEOUS) ×3 IMPLANT
DRAPE EXTREMITY T 121X128X90 (DRAPE) ×2 IMPLANT
DRAPE HALF SHEET 40X57 (DRAPES) ×3 IMPLANT
ELECT REM PT RETURN 9FT ADLT (ELECTROSURGICAL) ×3
ELECTRODE REM PT RTRN 9FT ADLT (ELECTROSURGICAL) ×1 IMPLANT
GAUZE SPONGE 4X4 12PLY STRL (GAUZE/BANDAGES/DRESSINGS) ×3 IMPLANT
GLOVE BIO SURGEON STRL SZ7.5 (GLOVE) ×5 IMPLANT
GLOVE BIOGEL PI IND STRL 8 (GLOVE) ×1 IMPLANT
GLOVE BIOGEL PI INDICATOR 8 (GLOVE) ×2
GOWN STRL REUS W/ TWL LRG LVL3 (GOWN DISPOSABLE) ×3 IMPLANT
GOWN STRL REUS W/TWL LRG LVL3 (GOWN DISPOSABLE) ×9
KIT BASIN OR (CUSTOM PROCEDURE TRAY) ×3 IMPLANT
KIT ROOM TURNOVER OR (KITS) ×3 IMPLANT
NDL HYPO 25GX1X1/2 BEV (NEEDLE) IMPLANT
NEEDLE HYPO 25GX1X1/2 BEV (NEEDLE) ×3 IMPLANT
NS IRRIG 1000ML POUR BTL (IV SOLUTION) ×3 IMPLANT
PACK GENERAL/GYN (CUSTOM PROCEDURE TRAY) ×3 IMPLANT
PAD ARMBOARD 7.5X6 YLW CONV (MISCELLANEOUS) ×6 IMPLANT
SPECIMEN JAR SMALL (MISCELLANEOUS) ×1 IMPLANT
SPONGE GAUZE 4X4 12PLY STER LF (GAUZE/BANDAGES/DRESSINGS) ×2 IMPLANT
SUT ETHILON 3 0 PS 1 (SUTURE) ×3 IMPLANT
SUT VIC AB 3-0 SH 27 (SUTURE) ×3
SUT VIC AB 3-0 SH 27X BRD (SUTURE) IMPLANT
SWAB COLLECTION DEVICE MRSA (MISCELLANEOUS) IMPLANT
TOWEL OR 17X24 6PK STRL BLUE (TOWEL DISPOSABLE) ×1 IMPLANT
TOWEL OR 17X26 10 PK STRL BLUE (TOWEL DISPOSABLE) ×3 IMPLANT
TUBE ANAEROBIC SPECIMEN COL (MISCELLANEOUS) IMPLANT
UNDERPAD 30X30 (UNDERPADS AND DIAPERS) ×3 IMPLANT
WATER STERILE IRR 1000ML POUR (IV SOLUTION) ×3 IMPLANT

## 2016-08-01 NOTE — Anesthesia Postprocedure Evaluation (Signed)
Anesthesia Post Note  Patient: Frances Graham  Procedure(s) Performed: Procedure(s) (LRB): AMPUTATION GREAT TOE (Right)  Patient location during evaluation: PACU Anesthesia Type: General Level of consciousness: sedated Pain management: pain level controlled Vital Signs Assessment: post-procedure vital signs reviewed and stable Respiratory status: spontaneous breathing and respiratory function stable Cardiovascular status: stable Anesthetic complications: no    Last Vitals:  Vitals:   08/01/16 0910 08/01/16 0915  BP: 99/67   Pulse: 90 88  Resp: 13 16  Temp:      Last Pain:  Vitals:   08/01/16 0851  TempSrc:   PainSc: 8                  Heath Badon DANIEL

## 2016-08-01 NOTE — Transfer of Care (Signed)
Immediate Anesthesia Transfer of Care Note  Patient: Frances Graham  Procedure(s) Performed: Procedure(s): AMPUTATION GREAT TOE (Right)  Patient Location: PACU  Anesthesia Type:General  Level of Consciousness: awake, alert , oriented and sedated  Airway & Oxygen Therapy: Patient Spontanous Breathing and Patient connected to nasal cannula oxygen  Post-op Assessment: Report given to RN, Post -op Vital signs reviewed and stable and Patient moving all extremities  Post vital signs: Reviewed and stable  Last Vitals:  Vitals:   08/01/16 0635 08/01/16 0842  BP: 107/69 116/74  Pulse: 94   Resp: 18   Temp: 36.8 C 36.1 C    Last Pain:  Vitals:   08/01/16 0648  TempSrc:   PainSc: 7       Patients Stated Pain Goal: 2 (08/01/16 [id number])  Complications: No apparent anesthesia complications

## 2016-08-01 NOTE — Anesthesia Procedure Notes (Signed)
Procedure Name: LMA Insertion Date/Time: 08/01/2016 7:47 AM Performed by: Fransisca KaufmannMEYER, Lesa Vandall E Pre-anesthesia Checklist: Patient identified, Emergency Drugs available, Suction available and Patient being monitored Patient Re-evaluated:Patient Re-evaluated prior to inductionOxygen Delivery Method: Circle System Utilized Preoxygenation: Pre-oxygenation with 100% oxygen Intubation Type: IV induction Ventilation: Mask ventilation without difficulty LMA: LMA inserted LMA Size: 4.0 Number of attempts: 1 Placement Confirmation: positive ETCO2 Tube secured with: Tape Dental Injury: Teeth and Oropharynx as per pre-operative assessment  Comments: Upper front teeth protruding and loose. Pt aware of potential for tooth not remaining after placement of breathing device.. Teeth remain intact after careful placement of LMA

## 2016-08-01 NOTE — Interval H&P Note (Signed)
History and Physical Interval Note:  08/01/2016 6:47 AM  Frances Graham  has presented today for surgery, with the diagnosis of Right great toe dry gangrene I96  The various methods of treatment have been discussed with the patient and family. After consideration of risks, benefits and other options for treatment, the patient has consented to  Procedure(s): AMPUTATION GREAT TOE (Right) as a surgical intervention .  The patient's history has been reviewed, patient examined, no change in status, stable for surgery.  I have reviewed the patient's chart and labs.  Questions were answered to the patient's satisfaction.     Waverly Ferrariickson, Brier Firebaugh

## 2016-08-01 NOTE — H&P (View-Only) (Signed)
POST OPERATIVE OFFICE NOTE    CC:  F/u for surgery  HPI:  This is a 50 y.o. female who is s/p Aortobifemoral bypass graft (14 x 7 mm Dacron graft).  She was discharged 10/;07/2016 with instructions to  continue Cipro for mild cellulitis right foot & gangrene of right great toe.  She was unable to take the Keflex due to an allergy.  Her toe continues to be very painful and she has to hang he foot off the side of the bed at night to help with the pain.  At this point she wants to have the toe amputated to stop the pain.  She is here to discuss this with Dr. Edilia Boickson.    As a note she was seen in the ED 07/13/2016 secondary to a stomach virus.  She has completely covered from that at this time.    Allergies  Allergen Reactions  . Penicillins Anaphylaxis    Has patient had a PCN reaction causing immediate rash, facial/tongue/throat swelling, SOB or lightheadedness with hypotension: Yes Has patient had a PCN reaction causing severe rash involving mucus membranes or skin necrosis: No Has patient had a PCN reaction that required hospitalization Yes Has patient had a PCN reaction occurring within the last 10 years: No If all of the above answers are "NO", then may proceed with Cephalosporin use.   . Benadryl [Diphenhydramine Hcl] Other (See Comments)    syncope  . Codeine     Reflux. Pt states not allergic to hydrocodone and oxycodone .  Marland Kitchen. Celebrex [Celecoxib] Nausea And Vomiting and Rash  . Keflex [Cephalexin] Nausea And Vomiting and Rash    Projectile vomiting, thrush, fever blisters, ED visit  . Lidocaine Swelling and Rash    Foot swelled up like a balloon    Current Outpatient Prescriptions  Medication Sig Dispense Refill  . gabapentin (NEURONTIN) 600 MG tablet Take 600 mg by mouth 3 (three) times daily.    Marland Kitchen. ibuprofen (ADVIL,MOTRIN) 800 MG tablet Take 800 mg by mouth every 8 (eight) hours as needed.    Marland Kitchen. lisinopril-hydrochlorothiazide (PRINZIDE,ZESTORETIC) 20-25 MG tablet Take 1  tablet by mouth daily.    . clonazePAM (KLONOPIN) 0.5 MG tablet Take 1 tablet (0.5 mg total) by mouth 2 (two) times daily as needed (anxiety). (Patient not taking: Reported on 07/24/2016) 15 tablet 0  . lisdexamfetamine (VYVANSE) 40 MG capsule Take 40 mg by mouth daily as needed. ADD.    Marland Kitchen. methocarbamol (ROBAXIN) 500 MG tablet Take 500 mg by mouth 3 (three) times daily. May take an extra 500 mg at night, as needed    . oxyCODONE-acetaminophen (PERCOCET/ROXICET) 5-325 MG tablet Take 1-2 tablets by mouth every 6 (six) hours as needed for moderate pain. (Patient not taking: Reported on 07/24/2016) 20 tablet 0  . zolpidem (AMBIEN) 5 MG tablet Take 1 tablet (5 mg total) by mouth at bedtime as needed for sleep. (Patient not taking: Reported on 07/24/2016) 10 tablet 0   No current facility-administered medications for this visit.      ROS:  See HPI  Physical Exam:  Vitals:   07/24/16 1442  BP: 94/64  Pulse: (!) 122  Temp: 98.1 F (36.7 C)   CT ABD/Pelvis 07/13/2016 IMPRESSION: 1. Small focal fluid collections are noted along the anterior abdominal wall incision line as well as within the right groin. These may reflect sterile postoperative fluid collections. Abscess is possible. 2. Aortoiliac bypass graft is widely patent. No evidence of an intra-abdominal abscess. 3. Gallstone.  No acute cholecystitis.   Incision:  Abdomin soft incision healing well.  Positive BS.  B groins soft and healing well.  Right groin with superficial incisional wound.  No erythema or drainage. Extremities:  Palpable PT 2+ pulses.  Right great toe dry gangrene.  2-5th toes with erythema and superficial wounds.  Active range of motion of toes intact.   Assessment/Plan:  This is a 49 y.o. female who is s/p:Aortobifemoral bypass graft (14 x 7 mm Dacron graft) right great toe dry gangrene.  We will schedule her for right great toe amputation Thursday 08/01/2016 by Dr. Dickson.         Nayelis Bonito  [name] PA-C [facility] [phone]  Clinic MD:  Pt seen and examined with Dr. Dickson 

## 2016-08-01 NOTE — Op Note (Signed)
    NAME: Frances Graham   MRN: [id number] DOB: 1966-04-30    DATE OF OPERATION: 08/01/2016  PREOP DIAGNOSIS: Dry gangrene right great toe  POSTOP DIAGNOSIS: Same  PROCEDURE: Ray amputation of right great toe  SURGEON: Di Kindlehristopher S. Edilia Boickson, MD, FACS  ASSIST: None  ANESTHESIA: Gen.   EBL: Minimal  INDICATIONS: Frances Pontngela W Sadler is a 50 y.o. female who presented with a gangrenous right great toe and aortoiliac occlusive disease. She underwent an aortobifemoral bypass graft. She presents for amputation of the right great toe.  FINDINGS: The tissue was well perfused.  TECHNIQUE: The patient was taken to the operative and received a general anesthetic. The right foot was prepped and draped in the usual sterile fashion. A tennis racquet incision was made encompassing the right great toe. Dissection was carried down to the metatarsal head which was divided with a CD4 soft. The tendons were retracted into the wound and divided. There was good bleeding. Hemostasis was obtained using electrocautery. The wound was irrigated with copious amounts of saline. The wounds closed with 2 interrupted 30 Vicryls. The skin was closed with interrupted 3-0 nylon's. A sterile dressing was applied. The patient tolerated the procedure well and was transferred to the recovery room in stable condition. All needle and sponge counts were correct.  Waverly Ferrarihristopher Finlay Mills, MD, FACS [facility]  DATE OF DICTATION:   08/01/2016

## 2016-08-02 ENCOUNTER — Encounter (HOSPITAL_COMMUNITY): Payer: Self-pay | Admitting: Vascular Surgery

## 2016-08-02 DIAGNOSIS — I959 Hypotension, unspecified: Secondary | ICD-10-CM | POA: Diagnosis not present

## 2016-08-02 DIAGNOSIS — I70261 Atherosclerosis of native arteries of extremities with gangrene, right leg: Secondary | ICD-10-CM | POA: Diagnosis not present

## 2016-08-02 DIAGNOSIS — I10 Essential (primary) hypertension: Secondary | ICD-10-CM | POA: Diagnosis not present

## 2016-08-02 DIAGNOSIS — F419 Anxiety disorder, unspecified: Secondary | ICD-10-CM | POA: Diagnosis not present

## 2016-08-02 LAB — CBC
HEMATOCRIT: 31 % — AB (ref 36.0–46.0)
Hemoglobin: 9.7 g/dL — ABNORMAL LOW (ref 12.0–15.0)
MCH: 30.4 pg (ref 26.0–34.0)
MCHC: 31.3 g/dL (ref 30.0–36.0)
MCV: 97.2 fL (ref 78.0–100.0)
Platelets: 241 10*3/uL (ref 150–400)
RBC: 3.19 MIL/uL — ABNORMAL LOW (ref 3.87–5.11)
RDW: 13.8 % (ref 11.5–15.5)
WBC: 9 10*3/uL (ref 4.0–10.5)

## 2016-08-02 LAB — TROPONIN I: Troponin I: <0.03 ng/mL (ref <0.03)

## 2016-08-02 MED ORDER — HYDROCHLOROTHIAZIDE 25 MG PO TABS: 25.0000 mg | ORAL_TABLET | Freq: Every day | ORAL | Status: DC

## 2016-08-02 MED ORDER — SODIUM CHLORIDE 0.9 % IV BOLUS (SEPSIS)
500.0000 mL | Freq: Once | INTRAVENOUS | Status: AC
  Administered 2016-08-02: 500 mL via INTRAVENOUS

## 2016-08-02 MED ORDER — OXYCODONE-ACETAMINOPHEN 5-325 MG PO TABS: 1.0000 | ORAL_TABLET | Freq: Four times a day (QID) | ORAL | 0 refills | Status: DC | PRN

## 2016-08-02 MED ORDER — LISINOPRIL 20 MG PO TABS: 20.0000 mg | ORAL_TABLET | Freq: Every day | ORAL | Status: DC

## 2016-08-02 MED ORDER — LISINOPRIL 10 MG PO TABS: 20.0000 mg | ORAL_TABLET | Freq: Every day | ORAL | Status: DC

## 2016-08-02 MED ORDER — KETOROLAC TROMETHAMINE 30 MG/ML IJ SOLN
30.0000 mg | Freq: Four times a day (QID) | INTRAMUSCULAR | Status: DC | PRN
Start: 2016-08-02 — End: 2016-08-03
  Administered 2016-08-02 (×2): 30 mg via INTRAVENOUS
  Filled 2016-08-02 (×2): qty 1

## 2016-08-02 NOTE — Progress Notes (Signed)
Orthopedic Tech Progress Note Patient Details:  Frances Graham 03/02/1966 [id number]  Ortho Devices Type of Ortho Device: Darco shoe Ortho Device/Splint Interventions: Application   Saul FordyceJennifer C Bill Yohn 08/02/2016, 12:10 PM

## 2016-08-02 NOTE — Progress Notes (Addendum)
Lianne CureMaureen Collins [facility] made aware of patients continual low BP 83/46 and 77/48 done in opposite arms to compare. Also made aware patient still in pain, Toradol given this AM as ordered as needed for pain. Orders received will continue to monitor patient Frances Graham, Frances AnKristin Jessup RN

## 2016-08-02 NOTE — Progress Notes (Signed)
B.P. Remains low and R.N. Aware and Dr. Imogene Burnhen aware will watch for now see orders. Skin warm and dry Rapid response was made aware earlier tonight and saw patient.

## 2016-08-02 NOTE — Progress Notes (Signed)
Lianne CureMaureen Collins PAC on floor to see patient and made aware of blood pressures, received orders for 500ml bolus and this was given as ordered. Will continue to monitor patient. Sheniah Supak, Randall AnKristin Jessup RN

## 2016-08-02 NOTE — Progress Notes (Signed)
Darco shoe fitted by Ortho tech. .meRN

## 2016-08-02 NOTE — Progress Notes (Signed)
RN called for a second set of eyes in regard to pt with hypotension, MD aware, new orders given and completed. MD wants to continue to monitor pt. Pt alert, oriented, answers all questions appropriately, pt asymptomatic.

## 2016-08-02 NOTE — Progress Notes (Signed)
Patient has a low B,P, see V.S. Flow sheet.R.N. Aware patient voice no complaints skin warm and dry. Alert and orin. Page Dr. Imogene Burnhen paged and call return . see orders for 500 ml N.S. Fl. Bolus now and if B.P. Still low repeat N.S. Fl bolus 500 ml .

## 2016-08-02 NOTE — Progress Notes (Signed)
After N.S. Fl. Bolus B.P. Remains low and patietn voice no complaints skin warm and dry. R.N. 2 nd Bolus given per previous order

## 2016-08-02 NOTE — Progress Notes (Addendum)
[facility] [place]  Subjective  - Tearful worried about her future living and issurance problems.  Very anxious.   Objective (!) 86/44 79 98.1 F (36.7 C) (Oral) 20 99%  Intake/Output Summary (Last 24 hours) at 08/02/16 0741 Last data filed at 08/02/16 0031  Gross per 24 hour  Intake             1695 ml  Output               15 ml  Net             1680 ml    Doppler AT signal Right great toe amp site clean and dry.  Redressed.    Assessment/Planning: POD # 1 Right great toe amp  Plan for discharge home today She has been hypotensive, will order darco shoe have her eat and then ambulate.  Pending hypotension will hold HTN medications this am. Was given IV bolus x 2, EKG is normal and she is asymptomatic.    Clinton GallantCOLLINS, Frances Graham [facility] 08/02/2016 7:41 AM --  Laboratory Lab Results:  Recent Labs  08/01/16 0700 08/02/16 0309  WBC 10.7* 9.0  HGB 11.9* 9.7*  HCT 36.7 31.0*  PLT 264 241   BMET  Recent Labs  08/01/16 0700  NA 135  K 4.2  CL 102  CO2 23  GLUCOSE 94  BUN 17  CREATININE 0.84  CALCIUM 9.2    COAG Lab Results  Component Value Date   INR 1.15 06/28/2016   INR 1.08 06/23/2016   INR 1.00 06/18/2016   No results found for: PTT  I have independently interviewed patient and agree with PA assessment and plan above. She has been hypotensive but mentating normally and wants to go home. Will hold until bp improves.   Frances C. Randie Heinzain, MD [facility] [place] Office: [phone] Pager: [phone]  I was called by nursing secondary to hypotension and we will transfer her to 3S given a third bolus of fluid.  If this does not increase her BH we will start on pressors if need be.  Frances Graham [name] PA-C

## 2016-08-03 DIAGNOSIS — I70261 Atherosclerosis of native arteries of extremities with gangrene, right leg: Secondary | ICD-10-CM | POA: Diagnosis not present

## 2016-08-03 LAB — GLUCOSE, CAPILLARY: Glucose-Capillary: 82 mg/dL (ref 65–99)

## 2016-08-03 NOTE — Progress Notes (Signed)
Pt education completed to include future appointments, current prescriptions and medications, and doctors discharge instructions. Pt alert and oriented, vital signs stable. Pt waiting for ride to arrive and then will be discharged via wheel chair with nursing staff. Temp: 98.3 F (36.8 C) (11/11 0706) Temp Source: Oral (11/11 0706) BP: 103/58 (11/11 0706) Pulse Rate: 86 (11/11 0706)  Jessica PriestSarah E Tarren Sabree 08/03/2016 12:01 PM

## 2016-08-03 NOTE — Progress Notes (Signed)
  Progress Note    08/03/2016 9:11 AM 2 Days Post-Op  Subjective:  Complains only of headache this a.m.  Vitals:   08/03/16 0408 08/03/16 0706  BP: 91/60 (!) 103/58  Pulse: 86 86  Resp: 11 15  Temp: 98.5 F (36.9 C) 98.3 F (36.8 C)    Physical Exam: aaox3 Palpable radial pulses R great toe site cdi with sutures  CBC    Component Value Date/Time   WBC 9.0 08/02/2016 0309   RBC 3.19 (L) 08/02/2016 0309   HGB 9.7 (L) 08/02/2016 0309   HCT 31.0 (L) 08/02/2016 0309   PLT 241 08/02/2016 0309   MCV 97.2 08/02/2016 0309   MCH 30.4 08/02/2016 0309   MCHC 31.3 08/02/2016 0309   RDW 13.8 08/02/2016 0309   LYMPHSABS 1.9 07/13/2016 1219   MONOABS 0.9 07/13/2016 1219   EOSABS 0.1 07/13/2016 1219   BASOSABS 0.0 07/13/2016 1219    BMET    Component Value Date/Time   NA 135 08/01/2016 0700   K 4.2 08/01/2016 0700   CL 102 08/01/2016 0700   CO2 23 08/01/2016 0700   GLUCOSE 94 08/01/2016 0700   BUN 17 08/01/2016 0700   CREATININE 0.84 08/01/2016 0700   CALCIUM 9.2 08/01/2016 0700   GFRNONAA >60 08/01/2016 0700   GFRAA >60 08/01/2016 0700    INR    Component Value Date/Time   INR 1.15 06/28/2016 1228     Intake/Output Summary (Last 24 hours) at 08/03/16 0911 Last data filed at 08/03/16 0600  Gross per 24 hour  Intake             1492 ml  Output                0 ml  Net             1492 ml     Assessment:  50 y.o. female is s/p R great toe amputation with unexplained and asymptomatic hypotension yesterday. Now feeling fine other than concern for migraine, blood pressure has improved.  Plan: D/c home today F/u with Dr. Edilia Boickson on [address]11.22    Trayveon Beckford C. Randie Heinzain, MD [facility] [place] Office: [phone] Pager: [phone]  08/03/2016 9:11 AM

## 2016-08-05 ENCOUNTER — Telehealth: Payer: Self-pay

## 2016-08-05 NOTE — Discharge Summary (Signed)
[facility] Discharge Summary   Patient ID:  Frances Graham MRN: [id number] DOB/AGE: [birth year] 48 y.o.  Admit date: 08/01/2016 Discharge date: 08/03/2016 Date of Surgery: 08/01/2016 Surgeon: Surgeon(s): Chuck Hinthristopher S Dickson, MD  Admission Diagnosis: Right great toe dry gangrene I96  Discharge Diagnoses:  Right great toe dry gangrene I96  Secondary Diagnoses: Past Medical History:  Diagnosis Date  . Anxiety    panic attacks- has then post op  . Asthma    as a child  . Attention deficit disorder   . Essential hypertension   . PAD (peripheral artery disease) (HCC)    Infrarenal aortic occlusion  . Temporomandibular joint disorder     Procedure(s): AMPUTATION GREAT TOE  Discharged Condition: good  HPI: This is a 50 y.o. female who is s/p Aortobifemoral bypass graft (14 x 7 mm Dacron graft).  She was discharged 10/;07/2016 with instructions to  continue Cipro for mild cellulitis right foot & gangrene of right great toe.  She was unable to take the Keflex due to an allergy.  Her toe continues to be very painful and she has to hang he foot off the side of the bed at night to help with the pain.  At this point she wants to have the toe amputated to stop the pain.  She is here to discuss this with Dr. Edilia Boickson.    As a note she was seen in the ED 07/13/2016 secondary to a stomach virus.  She has completely covered from that at this time.    Assessment/Plan:  This is a 50 y.o. female who is s/p:Aortobifemoral bypass graft (14 x 7 mm Dacron graft) right great toe dry gangrene.  We will schedule her for right great toe amputation Thursday 08/01/2016 by Dr. Edilia Boickson.    Hospital Course:  Frances Pontngela W Kazmierczak is a 50 y.o. female is S/P Right Procedure(s): AMPUTATION GREAT TOE  POD#1 Doppler AT signal Right great toe amp site clean and dry.  Redressed.    Assessment/Planning: POD # 1 Right great toe amp  Plan for discharge home today She has been  hypotensive, will order darco shoe have her eat and then ambulate.  Pending hypotension will hold HTN medications this am. Was given IV bolus x 2, EKG is normal and she is asymptomatic. I was called by nursing secondary to hypotension and we will transfer her to 3S given a third bolus of fluid.  If this does not increase her BH we will start on pressors if need be.  She received 3 total bolus NS IV fluids. Her BP was stable 103/58, asymptomatic.  Discharged home in stable condition.    Significant Diagnostic Studies: CBC Lab Results  Component Value Date   WBC 9.0 08/02/2016   HGB 9.7 (L) 08/02/2016   HCT 31.0 (L) 08/02/2016   MCV 97.2 08/02/2016   PLT 241 08/02/2016    BMET    Component Value Date/Time   NA 135 08/01/2016 0700   K 4.2 08/01/2016 0700   CL 102 08/01/2016 0700   CO2 23 08/01/2016 0700   GLUCOSE 94 08/01/2016 0700   BUN 17 08/01/2016 0700   CREATININE 0.84 08/01/2016 0700   CALCIUM 9.2 08/01/2016 0700   GFRNONAA >60 08/01/2016 0700   GFRAA >60 08/01/2016 0700   COAG Lab Results  Component Value Date   INR 1.15 06/28/2016   INR 1.08 06/23/2016   INR 1.00 06/18/2016     Disposition:  Discharge to :Home Discharge Instructions  Call MD for:  redness, tenderness, or signs of infection (pain, swelling, bleeding, redness, odor or green/yellow discharge around incision site)    Complete by:  As directed    Call MD for:  severe or increased pain, loss or decreased feeling  in affected limb(s)    Complete by:  As directed    Call MD for:  temperature >100.5    Complete by:  As directed    Discharge instructions    Complete by:  As directed    Heel weight bearing in Darco shoe.  Elevate your foot when you are at rest.  Do not change the dressing for 48 hours.  You may shower in 48 hours pat incision dry and cover with clean guaze.   Driving Restrictions    Complete by:  As directed    No driving for 4 weeks   Increase activity slowly    Complete by:  As  directed    Walk with assistance use walker or cane as needed   Lifting restrictions    Complete by:  As directed    No heavy lifting for 4 weeks   Resume previous diet    Complete by:  As directed        Medication List    TAKE these medications   gabapentin 600 MG tablet Commonly known as:  NEURONTIN Take 600 mg by mouth 3 (three) times daily.   ibuprofen 800 MG tablet Commonly known as:  ADVIL,MOTRIN Take 800 mg by mouth every 8 (eight) hours as needed for moderate pain.   lisdexamfetamine 40 MG capsule Commonly known as:  VYVANSE Take 40 mg by mouth daily as needed. ADD.   lisinopril-hydrochlorothiazide 20-25 MG tablet Commonly known as:  PRINZIDE,ZESTORETIC Take 1 tablet by mouth daily.   methocarbamol 500 MG tablet Commonly known as:  ROBAXIN Take 500 mg by mouth 3 (three) times daily. May take an extra 500 mg at night, as needed   oxyCODONE-acetaminophen 5-325 MG tablet Commonly known as:  PERCOCET/ROXICET Take 1-2 tablets by mouth every 6 (six) hours as needed for moderate pain.   oxyCODONE-acetaminophen 5-325 MG tablet Commonly known as:  PERCOCET/ROXICET Take 1 tablet by mouth every 6 (six) hours as needed.      Verbal and written Discharge instructions given to the patient. Wound care per Discharge AVS   F/u with Dr. Edilia Boickson on 11.22  Signed: Clinton GallantCOLLINS, EMMA [facility] 08/05/2016, 1:47 PM

## 2016-08-05 NOTE — Telephone Encounter (Signed)
Phone call from pt.  Stated she didn't receive any discharge instructions re: activity and care of toe amputation site.  Advised of discharge instructions as were noted by the PA documentation:    "Heel weight bearing in Darco shoe. Elevate your foot when you are at rest. Do not change the dressing for 48 hours. You may shower in 48 hours pat incision dry and cover with clean guaze.";  "Walk with assistance use walker or cane as needed."     Encouraged to ambulate at intervals in the Darco shoe, and to intermittently elevate the right LE to reduce pain / swelling.  Stated the Percocet are only helping the pain a little bit, and she is taking Ibuprofen to help manage the pain further.  Advised to call if the pain doesn't begin improving, and will reassess in the office.  Verb. Understanding.

## 2016-08-07 ENCOUNTER — Emergency Department (HOSPITAL_COMMUNITY)
Admission: EM | Admit: 2016-08-07 | Discharge: 2016-08-07 | Disposition: A | Discharge status: Home / Self Care | Payer: Medicaid Other | Attending: Emergency Medicine | Admitting: Emergency Medicine

## 2016-08-07 ENCOUNTER — Encounter (HOSPITAL_COMMUNITY): Payer: Self-pay

## 2016-08-07 ENCOUNTER — Encounter: Payer: Self-pay | Admitting: Vascular Surgery

## 2016-08-07 ENCOUNTER — Emergency Department (HOSPITAL_COMMUNITY): Payer: Medicaid Other

## 2016-08-07 DIAGNOSIS — J45909 Unspecified asthma, uncomplicated: Secondary | ICD-10-CM | POA: Insufficient documentation

## 2016-08-07 DIAGNOSIS — R0789 Other chest pain: Secondary | ICD-10-CM | POA: Insufficient documentation

## 2016-08-07 DIAGNOSIS — I10 Essential (primary) hypertension: Secondary | ICD-10-CM | POA: Insufficient documentation

## 2016-08-07 DIAGNOSIS — Z79899 Other long term (current) drug therapy: Secondary | ICD-10-CM | POA: Insufficient documentation

## 2016-08-07 DIAGNOSIS — F17210 Nicotine dependence, cigarettes, uncomplicated: Secondary | ICD-10-CM | POA: Diagnosis not present

## 2016-08-07 DIAGNOSIS — R072 Precordial pain: Secondary | ICD-10-CM | POA: Diagnosis present

## 2016-08-07 LAB — CBC
HEMATOCRIT: 33.7 % — AB (ref 36.0–46.0)
Hemoglobin: 10.9 g/dL — ABNORMAL LOW (ref 12.0–15.0)
MCH: 30 pg (ref 26.0–34.0)
MCHC: 32.3 g/dL (ref 30.0–36.0)
MCV: 92.8 fL (ref 78.0–100.0)
Platelets: 294 10*3/uL (ref 150–400)
RBC: 3.63 MIL/uL — ABNORMAL LOW (ref 3.87–5.11)
RDW: 13.3 % (ref 11.5–15.5)
WBC: 9.2 10*3/uL (ref 4.0–10.5)

## 2016-08-07 LAB — BASIC METABOLIC PANEL
Anion gap: 8 (ref 5–15)
BUN: 10 mg/dL (ref 6–20)
CALCIUM: 9.3 mg/dL (ref 8.9–10.3)
CO2: 25 mmol/L (ref 22–32)
Chloride: 106 mmol/L (ref 101–111)
Creatinine, Ser: 0.86 mg/dL (ref 0.44–1.00)
GFR calc Af Amer: >60 mL/min (ref >60)
GLUCOSE: 95 mg/dL (ref 65–99)
Potassium: 4.3 mmol/L (ref 3.5–5.1)
Sodium: 139 mmol/L (ref 135–145)

## 2016-08-07 LAB — I-STAT TROPONIN, ED
Troponin i, poc: 0 ng/mL (ref 0.00–0.08)
Troponin i, poc: 0 ng/mL (ref 0.00–0.08)

## 2016-08-07 MED ORDER — OXYCODONE-ACETAMINOPHEN 5-325 MG PO TABS
2.0000 | ORAL_TABLET | Freq: Once | ORAL | Status: AC
  Administered 2016-08-07: 2 via ORAL
  Filled 2016-08-07: qty 2

## 2016-08-07 MED ORDER — MORPHINE SULFATE (PF) 4 MG/ML IV SOLN
4.0000 mg | INTRAVENOUS | Status: DC | PRN
  Administered 2016-08-07: 4 mg via INTRAVENOUS
  Filled 2016-08-07: qty 1

## 2016-08-07 NOTE — ED Triage Notes (Signed)
[facility] for cp since this morning at 0500. C/o substernal cp without relief. EMS gave her 3 NTG with intermittent relief with pain from 4-5/10. BP elevated prior to NTG. CBG 124. 22g to LAC.

## 2016-08-07 NOTE — ED Provider Notes (Signed)
[facility]-EMERGENCY DEPT Provider Note   CSN: [id number] Arrival date & time: 08/07/16  1533     History   Chief Complaint Chief Complaint  Patient presents with  . Chest Pain    HPI Frances Graham is a 50 y.o. female.  HPI 50 y.o.femalewho is s/p Aortobifemoral bypass graft on 10/6 and toe amputation on 11/9 comes in with cc of chest pain. Chest pain is substernal, non radiating. There is no associated dib. PT reports that the chest pain is stinging type sharp pain and constant since 5 am today. Pt started unprovoked. PT has no specific aggravating or reliving factors and denies any exertional component or association with food intake. PT has no hx of CAD. She smoked 1 ppd x 10 years, currently down to 2 cigarettes/day.   Past Medical History:  Diagnosis Date  . Anxiety    panic attacks- has then post op  . Asthma    as a child  . Attention deficit disorder   . Essential hypertension   . PAD (peripheral artery disease) (HCC)    Infrarenal aortic occlusion  . Temporomandibular joint disorder     Patient Active Problem List   Diagnosis Date Noted  . Gangrene of toe of right foot (HCC) 08/01/2016  . Pre-operative clearance   . Mixed hyperlipidemia   . Peripheral vascular disease (HCC) 06/23/2016  . Aortoiliac occlusive disease (HCC) 06/23/2016  . Acute kidney injury (HCC) 06/23/2016  . Essential hypertension 06/23/2016  . Ischemic ulcer of foot (HCC) 06/23/2016  . Cardiovascular risk factor 06/23/2016  . Leukocytosis 06/23/2016  . Tobacco abuse 06/23/2016  . Chronic low back pain 06/23/2016  . ADD (attention deficit disorder) 06/23/2016    Past Surgical History:  Procedure Laterality Date  . AMPUTATION Right 08/01/2016   Procedure: AMPUTATION GREAT TOE;  Surgeon: Chuck Hint, MD;  Location: [facility] OR;  Service: Vascular;  Laterality: Right;  . AMPUTATION TOE Right 08/01/2016  . AORTA - BILATERAL FEMORAL ARTERY BYPASS GRAFT Bilateral 06/28/2016   Procedure:  AORTOBIFEMORAL BYPASS GRAFT;  Surgeon: Chuck Hint, MD;  [facility];  Service: Vascular;  Laterality: Bilateral;  . BIOPSY THYROID    . LEG SURGERY Left 1970   pin  to fix cogental hip  . MENISCUS REPAIR Left    2015    OB History    No data available       Home Medications    Prior to Admission medications   Medication Sig Start Date End Date Taking? Authorizing Provider  gabapentin (NEURONTIN) 600 MG tablet Take 600 mg by mouth 3 (three) times daily.    Historical Provider, MD  ibuprofen (ADVIL,MOTRIN) 800 MG tablet Take 800 mg by mouth every 8 (eight) hours as needed for moderate pain.     Historical Provider, MD  lisdexamfetamine (VYVANSE) 40 MG capsule Take 40 mg by mouth daily as needed. ADD.    Historical Provider, MD  lisinopril-hydrochlorothiazide (PRINZIDE,ZESTORETIC) 20-25 MG tablet Take 1 tablet by mouth daily.    Historical Provider, MD  methocarbamol (ROBAXIN) 500 MG tablet Take 500 mg by mouth 3 (three) times daily. May take an extra 500 mg at night, as needed    Historical Provider, MD  oxyCODONE-acetaminophen (PERCOCET/ROXICET) 5-325 MG tablet Take 1-2 tablets by mouth every 6 (six) hours as needed for moderate pain. 08/02/16   Lars Mage, PA-C  oxyCODONE-acetaminophen (PERCOCET/ROXICET) 5-325 MG tablet Take 1 tablet by mouth every 6 (six) hours as needed. 08/02/16   Willa Rough  Thomasena Edisollins, PA-C    Family History Family History  Problem Relation Age of Onset  . COPD Mother   . CAD Mother   . CAD Maternal Grandmother   . Diabetes Maternal Uncle     Social History Social History  Substance Use Topics  . Smoking status: Current Every Day Smoker    Packs/day: 0.20    Years: 10.00    Types: Cigarettes  . Smokeless tobacco: Never Used  . Alcohol use No     Allergies   Penicillins; Benadryl [diphenhydramine hcl]; Celebrex [celecoxib]; Keflex [cephalexin]; Lidocaine; and Codeine   Review of Systems Review of Systems  ROS 10 Systems reviewed  and are negative for acute change except as noted in the HPI.     Physical Exam Updated Vital Signs BP 114/84   Pulse 86   Temp 98.2 F (36.8 C) (Oral)   Resp 13   LMP 06/14/2016 (Exact Date)   SpO2 97%   Physical Exam  Constitutional: She is oriented to person, place, and time. She appears well-developed.  HENT:  Head: Normocephalic and atraumatic.  Eyes: EOM are normal.  Neck: Normal range of motion. Neck supple.  Cardiovascular: Normal rate and intact distal pulses.   Pulmonary/Chest: Effort normal. No respiratory distress. She has no wheezes.  Abdominal: Bowel sounds are normal. There is no guarding.  Surgical wound site healing well  Musculoskeletal: She exhibits no edema or tenderness.  Surgical wound site in the toe healing well  Neurological: She is alert and oriented to person, place, and time.  Skin: Skin is warm and dry. Capillary refill takes less than 2 seconds.  Nursing note and vitals reviewed.    ED Treatments / Results  Labs (all labs ordered are listed, but only abnormal results are displayed) Labs Reviewed  CBC - Abnormal; Notable for the following:       Result Value   RBC 3.63 (*)    Hemoglobin 10.9 (*)    HCT 33.7 (*)    All other components within normal limits  BASIC METABOLIC PANEL  I-STAT TROPOININ, ED  I-STAT TROPOININ, ED    EKG  EKG Interpretation  Date/Time:  Wednesday August 07 2016 15:46:37 EST Ventricular Rate:  87 PR Interval:  160 QRS Duration: 78 QT Interval:  346 QTC Calculation: 416 R Axis:   28 Text Interpretation:  Normal sinus rhythm Septal infarct , age undetermined Abnormal ECG No acute changes Confirmed by Rhunette CroftNANAVATI, MD, Janey GentaANKIT [phone]([id number]) on 08/07/2016 4:05:14 PM       Radiology Dg Chest 2 View  Result Date: 08/07/2016 CLINICAL DATA:  Chest pain EXAM: CHEST  2 VIEW COMPARISON:  Chest radiograph 06/29/2016 FINDINGS: Shallow lung inflation. Cardiomediastinal contours are normal. No pneumothorax or pleural  effusion. No focal airspace consolidation or pulmonary edema. IMPRESSION: Shallow lung inflation without focal airspace disease. Electronically Signed   By: Deatra RobinsonKevin  Herman M.D.   On: 08/07/2016 16:16    Procedures Procedures (including critical care time)  Medications Ordered in ED Medications  morphine 4 MG/ML injection 4 mg (4 mg Intravenous Given 08/07/16 1951)     Initial Impression / Assessment and Plan / ED Course  I have reviewed the triage vital signs and the nursing notes.  Pertinent labs & imaging results that were available during my care of the patient were reviewed by me and considered in my medical decision making (see chart for details).  Clinical Course    Pt comes in with cc of chest pain. Chest pain is atypical,  substernal, non radiating, constant pain.  Differential diagnosis includes: ACS syndrome CHF exacerbation Pericardial effusion Pleural effusion Pulmonary edema PE Musculoskeletal pain  Pain not consistent with PUD. Pt has had recent surgery, but the pain is not pleuritic, there is no dib and pretest probability for PE is extremely low. Cardiac etiology chest pain also thought to be highly unlikely. 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 We will get serial trops, if neg, we will dc.  History Highly suspicious  Slightly suspicious 0  EKG: Normal 0   Age 89 - 67 years 1   Risk Factors  ? 3 risk factors or history of atherosclerotic disease 2  HEAR SCORE IS 3   Final Clinical Impressions(s) / ED Diagnoses   Final diagnoses:  Atypical chest pain    New Prescriptions New Prescriptions   No medications on file     Derwood KaplanAnkit Kirsi Hugh, MD 08/07/16 2109

## 2016-08-07 NOTE — Discharge Instructions (Signed)

## 2016-08-14 ENCOUNTER — Encounter: Payer: Self-pay | Admitting: Vascular Surgery

## 2016-08-14 ENCOUNTER — Ambulatory Visit (INDEPENDENT_AMBULATORY_CARE_PROVIDER_SITE_OTHER): Payer: Self-pay | Admitting: Vascular Surgery

## 2016-08-14 VITALS — BP 113/70 | HR 101 | Temp 97.7°F | Resp 18 | Ht 67.0 in | Wt 213.6 lb

## 2016-08-14 DIAGNOSIS — I700 Atherosclerosis of aorta: Secondary | ICD-10-CM

## 2016-08-14 DIAGNOSIS — I7410 Embolism and thrombosis of unspecified parts of aorta: Secondary | ICD-10-CM

## 2016-08-14 NOTE — Progress Notes (Signed)
   Patient name: Frances Graham MRN: [id number] DOB: 01-26-1966 Sex: female  REASON FOR VISIT: Postop  HPI: Frances Graham is a 50 y.o. female who presents for postoperative follow-up status post right great toe amputation on 08/01/2016. On postop day 1, she was hypotensive with systolic pressure in the 80s but was asymptomatic. Her EKG was normal. She was given two500 mL fluid boluses and transferred to the stepdown unit. By postop day 2, her hypotension had resolved and she was discharged home. Prior to this, she underwent aortobifemoral bypass on 06/28/2016.  The patient states that her dictation site has been sore. She hit her right foot on the dresser today. She states that ambulating with a Darco shoe is difficult.  Current Outpatient Prescriptions  Medication Sig Dispense Refill  . gabapentin (NEURONTIN) 600 MG tablet Take 600 mg by mouth 3 (three) times daily.    Marland Kitchen. ibuprofen (ADVIL,MOTRIN) 800 MG tablet Take 800 mg by mouth every 6 (six) hours as needed (for pain).     Marland Kitchen. lisdexamfetamine (VYVANSE) 40 MG capsule Take 40 mg by mouth daily as needed. ADD.    Marland Kitchen. lisinopril-hydrochlorothiazide (PRINZIDE,ZESTORETIC) 20-25 MG tablet Take 1 tablet by mouth daily.    . methocarbamol (ROBAXIN) 500 MG tablet Take 500 mg by mouth 3 (three) times daily. May take an extra 500 mg at night, as needed    . oxyCODONE-acetaminophen (PERCOCET/ROXICET) 5-325 MG tablet Take 1-2 tablets by mouth every 6 (six) hours as needed for moderate pain. (Patient not taking: Reported on 08/14/2016) 30 tablet 0  . oxyCODONE-acetaminophen (PERCOCET/ROXICET) 5-325 MG tablet Take 1 tablet by mouth every 6 (six) hours as needed. (Patient not taking: Reported on 08/14/2016) 30 tablet 0   No current facility-administered medications for this visit.     REVIEW OF SYSTEMS:  [X]  denotes positive finding, [ ]  denotes negative finding Cardiac  Comments:  Chest pain or chest pressure:    Shortness of breath upon exertion:     Short of breath when lying flat:    Irregular heart rhythm:    Constitutional    Fever or chills:      PHYSICAL EXAM: Vitals:   08/14/16 1456  BP: 113/70  Pulse: (!) 101  Resp: 18  Temp: 97.7 F (36.5 C)  TempSrc: Oral  SpO2: 98%  Weight: 213 lb 9.6 oz (96.9 kg)  Height: 5\' 7"  (1.702 m)    GENERAL: The patient is a well-nourished female, in no acute distress. The vital signs are documented above. PULMONARY: There is good air exchange bilaterally without wheezing or rales. VASCULAR: Midline abdominal incision and bilateral groin incisions healed. Right great toe amputation site with some swelling and mild erythema. No drainage. 2+ PT pulses bilaterally.  MEDICAL ISSUES: Status post aortobifemoral bypass and right great toe amputation  The patient's right great toe amputation site is healing. Given there is some swelling present, we will hold off on removing all of the sutures. A few were removed today.  She was given instructions on how to clean her wound.  She was advised to place bacitracin and gauze on top the amputation site, wrap the site, then a clean white sock on top. She is starting to regain her strength. She will follow up in 2 weeks for wound check.  Maris BergerKimberly Sherrell Weir, PA-C [facility] [place]

## 2016-08-21 ENCOUNTER — Encounter: Payer: Self-pay | Admitting: Vascular Surgery

## 2016-08-26 DIAGNOSIS — R52 Pain, unspecified: Secondary | ICD-10-CM

## 2016-08-28 ENCOUNTER — Encounter: Payer: Self-pay | Admitting: Family

## 2016-08-28 ENCOUNTER — Ambulatory Visit (INDEPENDENT_AMBULATORY_CARE_PROVIDER_SITE_OTHER): Payer: Medicaid Other | Admitting: Family

## 2016-08-28 VITALS — BP 99/70 | HR 99 | Temp 98.0°F | Ht 67.0 in | Wt 212.2 lb

## 2016-08-28 DIAGNOSIS — I700 Atherosclerosis of aorta: Secondary | ICD-10-CM

## 2016-08-28 DIAGNOSIS — Z89411 Acquired absence of right great toe: Secondary | ICD-10-CM

## 2016-08-28 DIAGNOSIS — S98111A Complete traumatic amputation of right great toe, initial encounter: Secondary | ICD-10-CM

## 2016-08-28 DIAGNOSIS — I7410 Embolism and thrombosis of unspecified parts of aorta: Secondary | ICD-10-CM

## 2016-08-28 DIAGNOSIS — Z95828 Presence of other vascular implants and grafts: Secondary | ICD-10-CM

## 2016-08-28 DIAGNOSIS — F17200 Nicotine dependence, unspecified, uncomplicated: Secondary | ICD-10-CM

## 2016-08-28 NOTE — Progress Notes (Signed)
Postoperative Visit   History of Present Illness  Frances Graham is a 50 y.o. female patient of Dr. Edilia Boickson who is s/p ray amputation of right great toe on 08/01/2016. On postop day 1, she was hypotensive with systolic pressure in the 80s but was asymptomatic. Her EKG was normal. She was given two500 mL fluid boluses and transferred to the stepdown unit. By postop day 2, her hypotension had resolved and she was discharged home. Prior to this, she underwent aortobifemoral bypass on 06/28/2016.  She states that ambulating with a Darco shoe is difficult.  Pt was last evaluated by Dr. Edilia Boickson and Karsten RoKim Trinh PA-C on 08-14-16. At that time midline abdominal incision and bilateral groin incisions healed. Right great toe amputation site with some swelling and mild erythema. No drainage. 2+ PT pulses bilaterally. The patient's right great toe amputation site was healing. Given there is some swelling present, some of the sutures were left in place. A few were removed that day.  She was given instructions on how to clean her wound.  She was advised to place bacitracin and gauze on top the amputation site, wrap the site, then a clean white sock on top. She was starting to regain her strength. She was to follow up in 2 weeks for wound check.  She returns today for 2 weeks follow up, about 4 weeks actually.   She reports that she has decreased her cigarette use to 1 cigarette/day; she has a strong odor of cigarette smoke.   The patient's  Incision is healed.  The patient notes pain is well controlled.  The patient's current symptoms are: wants the sutures out, c/o itching at the right great toe amputation site.  For VQI Use Only  PRE-ADM LIVING: Home  AMB STATUS: Ambulatory  Physical Examination  Vitals:   08/28/16 1343 08/28/16 1347  BP: 101/65 99/70  Pulse: 99 99  Temp: 98 F (36.7 C)   TempSrc: Oral   SpO2: 99%   Weight: 212 lb 3.2 oz (96.3 kg)   Height: 5\' 7"  (1.702 m)    Body  mass index is 33.24 kg/m.  Right great toe stump incision is healed.  Sutures are intact. No swelling, no erythema, no discoloration.   Medical Decision Making  Frances Graham is a 50 y.o. female who presents s/p ray amputation of right great toe on 08/01/2016.  Prior to this, she underwent aortobifemoral bypass on 06/28/2016. All remaining sutures are removed from her right great toe amputation site which is healing appropriately with resolution of pre-operative symptoms. Continued daily toe stump cleansing and bacitracin and gauze on top the amputation site, wrap the site, then a clean white sock on top.   I discussed with Dr. Edilia Boickson pt questions re when she can drive and return to work. She may drive in a couple of weeks if her right toe amputation site is not bothering her. She may return to work in a couple of weeks.   Return to clinic in 6 months with ABI's, see Dr. Edilia Boickson. I advised her to return sooner if needed.  The patient was counseled re smoking cessation and given several free resources re smoking cessation.  I discussed in depth with the patient the nature of atherosclerosis, and emphasized the importance of maximal medical management including strict control of blood pressure, blood glucose, and lipid levels, obtaining regular exercise, and cessation of smoking.  The patient is aware that without maximal medical management the underlying atherosclerotic disease process  will progress, possibly leading to a more proximal amputation. The patient agrees to participate in their maximal medical care.  Thank you for allowing us to participate in this patient's care.   NICKEL, Carma LairSUZANNE L, RN, MSN, FNP-C [facility] [place] Office: [phone]  08/28/2016, 2:02 PM  Clinic MD: Edilia Boickson

## 2016-08-28 NOTE — Patient Instructions (Signed)
Peripheral Vascular Disease Peripheral vascular disease (PVD) is a disease of the blood vessels that are not part of your heart and brain. A simple term for PVD is poor circulation. In most cases, PVD narrows the blood vessels that carry blood from your heart to the rest of your body. This can result in a decreased supply of blood to your arms, legs, and internal organs, like your stomach or kidneys. However, it most often affects a person's lower legs and feet. There are two types of PVD.  Organic PVD. This is the more common type. It is caused by damage to the structure of blood vessels.  Functional PVD. This is caused by conditions that make blood vessels contract and tighten (spasm). Without treatment, PVD tends to get worse over time. PVD can also lead to acute ischemic limb. This is when an arm or limb suddenly has trouble getting enough blood. This is a medical emergency. Follow these instructions at home:  Take medicines only as told by your doctor.  Do not use any tobacco products, including cigarettes, chewing tobacco, or electronic cigarettes. If you need help quitting, ask your doctor.  Lose weight if you are overweight, and maintain a healthy weight as told by your doctor.  Eat a diet that is low in fat and cholesterol. If you need help, ask your doctor.  Exercise regularly. Ask your doctor for some good activities for you.  Take good care of your feet.  Wear comfortable shoes that fit well.  Check your feet often for any cuts or sores. Contact a doctor if:  You have cramps in your legs while walking.  You have leg pain when you are at rest.  You have coldness in a leg or foot.  Your skin changes.  You are unable to get or have an erection (erectile dysfunction).  You have cuts or sores on your feet that are not healing. Get help right away if:  Your arm or leg turns cold and blue.  Your arms or legs become red, warm, swollen, painful, or numb.  You have  chest pain or trouble breathing.  You suddenly have weakness in your face, arm, or leg.  You become very confused or you cannot speak.  You suddenly have a very bad headache.  You suddenly cannot see. This information is not intended to replace advice given to you by your health care provider. Make sure you discuss any questions you have with your health care provider. Document Released: 12/04/2009 Document Revised: 02/15/2016 Document Reviewed: 02/17/2014 Elsevier Interactive Patient Education  2017 Elsevier Inc.      Steps to Quit Smoking Smoking tobacco can be bad for your health. It can also affect almost every organ in your body. Smoking puts you and people around you at risk for many serious long-lasting (chronic) diseases. Quitting smoking is hard, but it is one of the best things that you can do for your health. It is never too late to quit. What are the benefits of quitting smoking? When you quit smoking, you lower your risk for getting serious diseases and conditions. They can include:  Lung cancer or lung disease.  Heart disease.  Stroke.  Heart attack.  Not being able to have children (infertility).  Weak bones (osteoporosis) and broken bones (fractures). If you have coughing, wheezing, and shortness of breath, those symptoms may get better when you quit. You may also get sick less often. If you are pregnant, quitting smoking can help to lower your chances   of having a baby of low birth weight. What can I do to help me quit smoking? Talk with your doctor about what can help you quit smoking. Some things you can do (strategies) include:  Quitting smoking totally, instead of slowly cutting back how much you smoke over a period of time.  Going to in-person counseling. You are more likely to quit if you go to many counseling sessions.  Using resources and support systems, such as:  Online chats with a counselor.  Phone quitlines.  Printed self-help  materials.  Support groups or group counseling.  Text messaging programs.  Mobile phone apps or applications.  Taking medicines. Some of these medicines may have nicotine in them. If you are pregnant or breastfeeding, do not take any medicines to quit smoking unless your doctor says it is okay. Talk with your doctor about counseling or other things that can help you. Talk with your doctor about using more than one strategy at the same time, such as taking medicines while you are also going to in-person counseling. This can help make quitting easier. What things can I do to make it easier to quit? Quitting smoking might feel very hard at first, but there is a lot that you can do to make it easier. Take these steps:  Talk to your family and friends. Ask them to support and encourage you.  Call phone quitlines, reach out to support groups, or work with a counselor.  Ask people who smoke to not smoke around you.  Avoid places that make you want (trigger) to smoke, such as:  Bars.  Parties.  Smoke-break areas at work.  Spend time with people who do not smoke.  Lower the stress in your life. Stress can make you want to smoke. Try these things to help your stress:  Getting regular exercise.  Deep-breathing exercises.  Yoga.  Meditating.  Doing a body scan. To do this, close your eyes, focus on one area of your body at a time from head to toe, and notice which parts of your body are tense. Try to relax the muscles in those areas.  Download or buy apps on your mobile phone or tablet that can help you stick to your quit plan. There are many free apps, such as QuitGuide from the CDC (Centers for Disease Control and Prevention). You can find more support from smokefree.gov and other websites. This information is not intended to replace advice given to you by your health care provider. Make sure you discuss any questions you have with your health care provider. Document Released:  07/06/2009 Document Revised: 05/07/2016 Document Reviewed: 01/24/2015 Elsevier Interactive Patient Education  2017 Elsevier Inc.  

## 2017-02-24 ENCOUNTER — Encounter: Payer: Self-pay | Admitting: Vascular Surgery

## 2017-02-26 ENCOUNTER — Ambulatory Visit: Payer: Self-pay | Admitting: Vascular Surgery

## 2017-02-26 ENCOUNTER — Encounter (HOSPITAL_COMMUNITY): Payer: Self-pay

## 2017-04-29 ENCOUNTER — Emergency Department (HOSPITAL_COMMUNITY)
Admission: EM | Admit: 2017-04-29 | Discharge: 2017-04-29 | Disposition: A | Discharge status: Home / Self Care | Payer: Medicaid Other | Attending: Emergency Medicine | Admitting: Emergency Medicine

## 2017-04-29 ENCOUNTER — Encounter (HOSPITAL_COMMUNITY): Payer: Self-pay | Admitting: Emergency Medicine

## 2017-04-29 DIAGNOSIS — M5431 Sciatica, right side: Secondary | ICD-10-CM

## 2017-04-29 DIAGNOSIS — M5441 Lumbago with sciatica, right side: Secondary | ICD-10-CM | POA: Insufficient documentation

## 2017-04-29 DIAGNOSIS — Z89411 Acquired absence of right great toe: Secondary | ICD-10-CM | POA: Insufficient documentation

## 2017-04-29 DIAGNOSIS — I10 Essential (primary) hypertension: Secondary | ICD-10-CM | POA: Insufficient documentation

## 2017-04-29 DIAGNOSIS — F17210 Nicotine dependence, cigarettes, uncomplicated: Secondary | ICD-10-CM | POA: Insufficient documentation

## 2017-04-29 MED ORDER — NAPROXEN 500 MG PO TABS: 500.0000 mg | ORAL_TABLET | Freq: Two times a day (BID) | ORAL | 0 refills | Status: DC

## 2017-04-29 MED ORDER — METHYLPREDNISOLONE 4 MG PO TBPK: ORAL_TABLET | ORAL | 0 refills | Status: DC

## 2017-04-29 MED ORDER — TRAMADOL HCL 50 MG PO TABS: 50.0000 mg | ORAL_TABLET | Freq: Four times a day (QID) | ORAL | 0 refills | Status: DC | PRN

## 2017-04-29 NOTE — ED Triage Notes (Signed)
Pt c/o right buttocks pain and right leg and weakness onset last night. 1 year ago patient had "clogged artery" in heart and needed surgery.  2+ pedal pulse. Sensation and motor function intact at baseline.

## 2017-04-29 NOTE — ED Provider Notes (Signed)
[facility]-EMERGENCY DEPT Provider Note   CSN: [id number] Arrival date & time: 04/29/17  0909     History   Chief Complaint Chief Complaint  Patient presents with  . Leg Pain    HPI Frances Graham is a 51 y.o. female. Chief complaint is right hip, buttock, and leg pain.  HPI 51 year old female with history of peripheral vascular disease status post aortobifemoral bypass by Dr. Durwin Nora one year ago. Has done well. She states that she had a toe amputated in her gait is different now. She states that she intermittently will get some pain in her low back. She has some pain in her right buttock radiating to the superior aspect of her right posterior leg for the last few days. She states that she was concerned and wanted to "make sure" that this wasn't her vascular disease. 2. Walks without claudication. Does not have discoloration of her toes does not have rest pain.  Unfortunately continues to smoke.  Past Medical History:  Diagnosis Date  . Anxiety    panic attacks- has then post op  . Asthma    as a child  . Attention deficit disorder   . Essential hypertension   . PAD (peripheral artery disease) (HCC)    Infrarenal aortic occlusion  . Temporomandibular joint disorder     Patient Active Problem List   Diagnosis Date Noted  . Gangrene of toe of right foot (HCC) 08/01/2016  . Pre-operative clearance   . Mixed hyperlipidemia   . Peripheral vascular disease (HCC) 06/23/2016  . Aortoiliac occlusive disease (HCC) 06/23/2016  . Acute kidney injury (HCC) 06/23/2016  . Essential hypertension 06/23/2016  . Ischemic ulcer of foot (HCC) 06/23/2016  . Cardiovascular risk factor 06/23/2016  . Leukocytosis 06/23/2016  . Tobacco abuse 06/23/2016  . Chronic low back pain 06/23/2016  . ADD (attention deficit disorder) 06/23/2016    Past Surgical History:  Procedure Laterality Date  . AMPUTATION Right 08/01/2016   Procedure: AMPUTATION GREAT TOE;  Surgeon: Chuck Hint, MD;   [facility] OR;  Service: Vascular;  Laterality: Right;  . AMPUTATION TOE Right 08/01/2016  . AORTA - BILATERAL FEMORAL ARTERY BYPASS GRAFT Bilateral 06/28/2016   Procedure: AORTOBIFEMORAL BYPASS GRAFT;  Surgeon: Chuck Hint, MD;  Location: [facility] OR;  Service: Vascular;  Laterality: Bilateral;  . BIOPSY THYROID    . LEG SURGERY Left 1970   pin  to fix cogental hip  . MENISCUS REPAIR Left    2015    OB History    No data available       Home Medications    Prior to Admission medications   Medication Sig Start Date End Date Taking? Authorizing Provider  ibuprofen (ADVIL,MOTRIN) 800 MG tablet Take 800 mg by mouth every 6 (six) hours as needed (for pain).    Yes [provider]  lisdexamfetamine (VYVANSE) 40 MG capsule Take 40 mg by mouth daily as needed. ADD.   Yes [provider]  methylPREDNISolone (MEDROL DOSEPAK) 4 MG TBPK tablet 6 po on day 1, then decrease by 1 per day 04/29/17   Rolland Porter, MD  naproxen (NAPROSYN) 500 MG tablet Take 1 tablet (500 mg total) by mouth 2 (two) times daily. 04/29/17   Rolland Porter, MD  oxyCODONE-acetaminophen (PERCOCET/ROXICET) 5-325 MG tablet Take 1-2 tablets by mouth every 6 (six) hours as needed for moderate pain. Patient not taking: Reported on 08/28/2016 08/02/16   Lars Mage, PA-C  traMADol (ULTRAM) 50 MG tablet Take 1 tablet (  50 mg total) by mouth every 6 (six) hours as needed. 04/29/17   Rolland PorterJames, Jubal Rademaker, MD    Family History Family History  Problem Relation Age of Onset  . COPD Mother   . CAD Mother   . CAD Maternal Grandmother   . Diabetes Maternal Uncle     Social History Social History  Substance Use Topics  . Smoking status: Current Every Day Smoker    Packs/day: 0.20    Years: 10.00    Types: Cigarettes  . Smokeless tobacco: Never Used  . Alcohol use No     Allergies   Bee venom; Penicillins; Benadryl [diphenhydramine hcl]; Celebrex [celecoxib]; Keflex [cephalexin]; Lidocaine; and Codeine   Review  of Systems Review of Systems  Constitutional: Negative for appetite change, chills, diaphoresis, fatigue and fever.  HENT: Negative for mouth sores, sore throat and trouble swallowing.   Eyes: Negative for visual disturbance.  Respiratory: Negative for cough, chest tightness, shortness of breath and wheezing.   Cardiovascular: Negative for chest pain.  Gastrointestinal: Negative for abdominal distention, abdominal pain, diarrhea, nausea and vomiting.  Endocrine: Negative for polydipsia, polyphagia and polyuria.  Genitourinary: Negative for dysuria, frequency and hematuria.  Musculoskeletal: Negative for gait problem.       Right buttock, and leg pain  Skin: Negative for color change, pallor and rash.  Neurological: Negative for dizziness, syncope, light-headedness and headaches.  Hematological: Does not bruise/bleed easily.  Psychiatric/Behavioral: Negative for behavioral problems and confusion.     Physical Exam Updated Vital Signs BP (!) 128/96 (BP Location: Left Arm)   Pulse 78   Temp 98 F (36.7 C) (Oral)   Resp 16   Ht 5\' 7"  (1.702 m)   Wt 95 kg (209 lb 6 oz)   SpO2 100%   BMI 32.79 kg/m   Physical Exam  Constitutional: She is oriented to person, place, and time. She appears well-developed and well-nourished. No distress.  HENT:  Head: Normocephalic.  Eyes: Pupils are equal, round, and reactive to light. Conjunctivae are normal. No scleral icterus.  Neck: Normal range of motion. Neck supple. No thyromegaly present.  Cardiovascular: Normal rate and regular rhythm.  Exam reveals no gallop and no friction rub.   No murmur heard. Pulmonary/Chest: Effort normal and breath sounds normal. No respiratory distress. She has no wheezes. She has no rales.  Abdominal: Soft. Bowel sounds are normal. She exhibits no distension. There is no tenderness. There is no rebound.  Musculoskeletal: Normal range of motion.  Tenderness in the right leg posteriorly and tenderness in the sciatic  notch.  Neurological: She is alert and oriented to person, place, and time.  Skin: Skin is warm and dry. No rash noted.  Palpable femoral, popliteal, and PT pulse. By Doppler she has a biphasic Doppler DP pulse and biphasic Doppler PT pulse and she has excellent capillary refill. She has no discoloration of petechiae or ecchymosis or gangrene  Psychiatric: She has a normal mood and affect. Her behavior is normal.     ED Treatments / Results  Labs (all labs ordered are listed, but only abnormal results are displayed) Labs Reviewed - No data to display  EKG  EKG Interpretation None       Radiology No results found.  Procedures Procedures (including critical care time)  Medications Ordered in ED Medications - No data to display   Initial Impression / Assessment and Plan / ED Course  I have reviewed the triage vital signs and the nursing notes.  Pertinent labs &  imaging results that were available during my care of the patient were reviewed by me and considered in my medical decision making (see chart for details).    Patient counseled extensively to stop smoking. However current exacerbation of pain and he can sciatic. She has no signs of vascular compromise of either leg. Plan anti-inflammatory versed and taper steroid tramadol primary care follow-up  Final Clinical Impressions(s) / ED Diagnoses   Final diagnoses:  Sciatica of right side    New Prescriptions New Prescriptions   METHYLPREDNISOLONE (MEDROL DOSEPAK) 4 MG TBPK TABLET    6 po on day 1, then decrease by 1 per day   NAPROXEN (NAPROSYN) 500 MG TABLET    Take 1 tablet (500 mg total) by mouth 2 (two) times daily.   TRAMADOL (ULTRAM) 50 MG TABLET    Take 1 tablet (50 mg total) by mouth every 6 (six) hours as needed.     Rolland Porter, MD 04/29/17 1256

## 2017-04-29 NOTE — Discharge Instructions (Addendum)
Exercises as demonstrated. Stop smoking. 5 day course Medrol, THEN start naproxen. Do not take these medications at the same time.

## 2017-08-17 IMAGING — MR MR LUMBAR SPINE W/O CM
4 of 6 series · 19 of 48 positions shown · non-contrast
Comparison: CT angiogram runoff June 23, 2016

CLINICAL DATA: Low back pain and bilateral groin pain. Necrotic
RIGHT great toe. Recent diagnosis of infrarenal aortic occlusion.

EXAM:
MRI LUMBAR SPINE WITHOUT CONTRAST
TECHNIQUE: Multiplanar, multisequence MR imaging of the lumbar spine was
performed. No intravenous contrast was administered.

[Series 2: T2 · sagittal · 4.0mm · 0.55mm/px · 4 of 12 slices shown (1 of 3)]
[im 1/12]
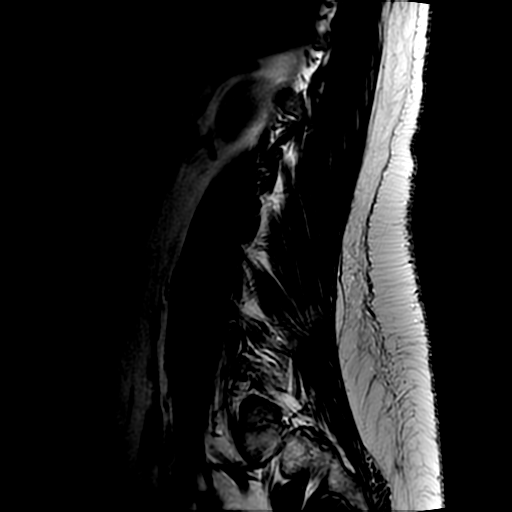
[im 4/12]
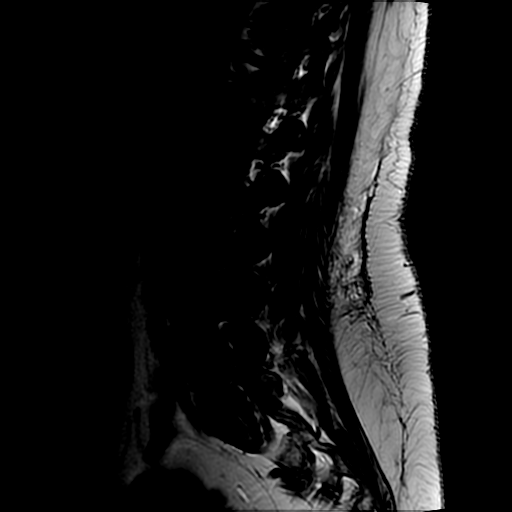
[im 8/12]
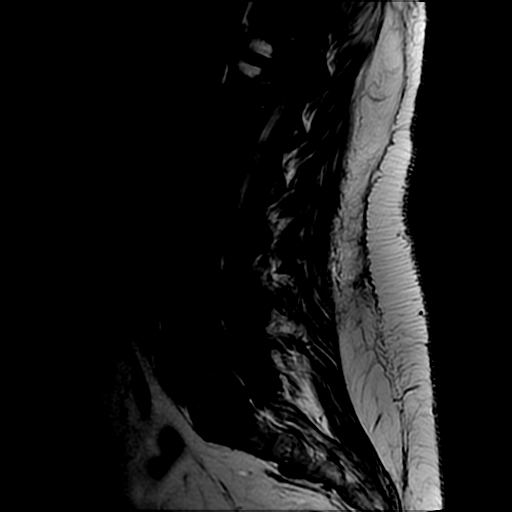
[im 12/12]
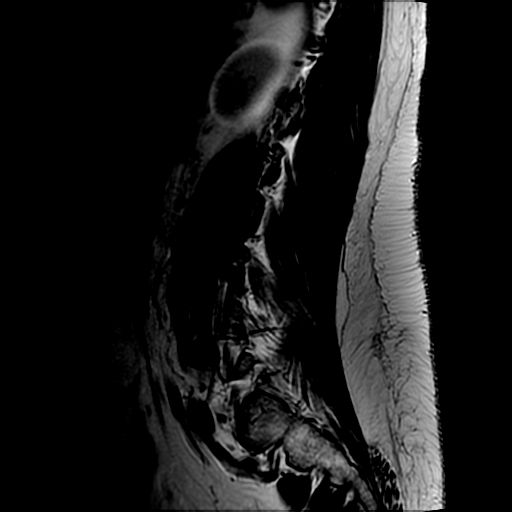

[Series 4: T1 · sagittal · 4.0mm · 0.55mm/px · 3 of 12 slices shown]
[im 1/12]
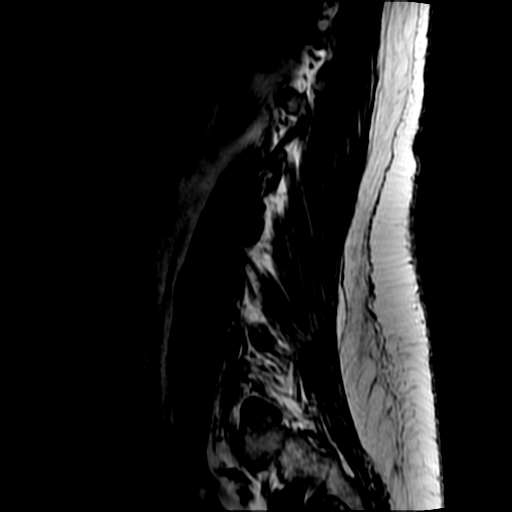
[im 8/12]
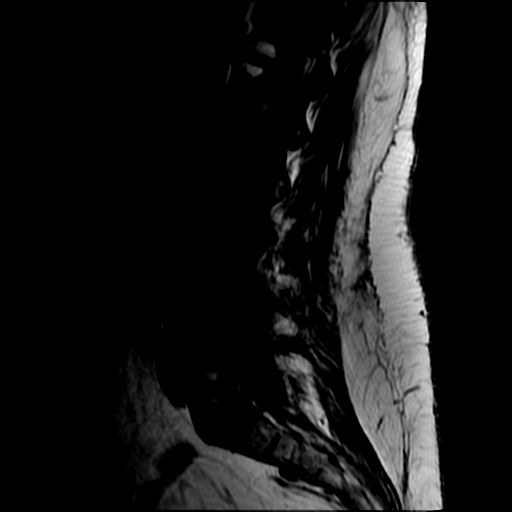
[im 12/12]
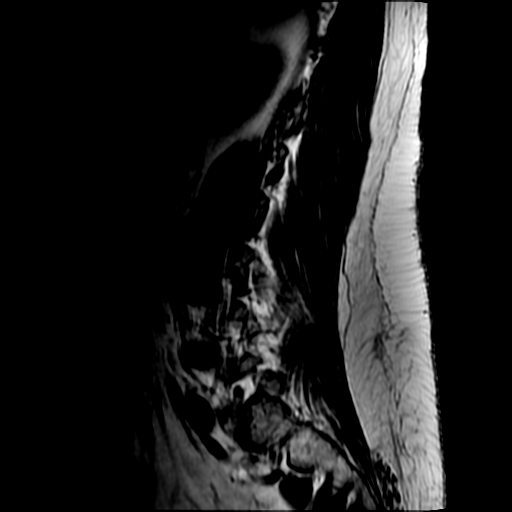

[Series 5: T2 · axial · 4.0mm · 0.39mm/px · z∈[-26,+250]mm · 6 of 16 slices shown (2 of 3)]
[im 1/16]
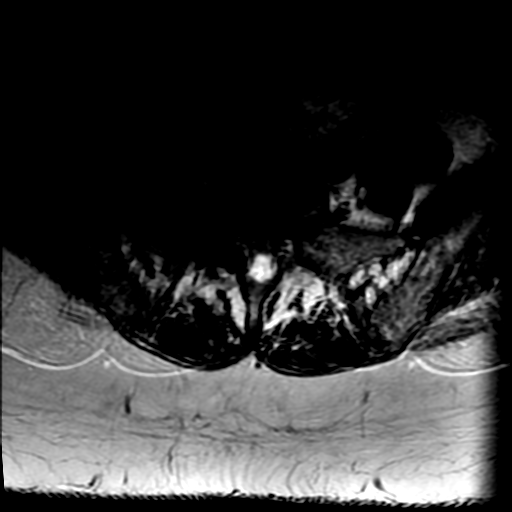
[im 4/16]
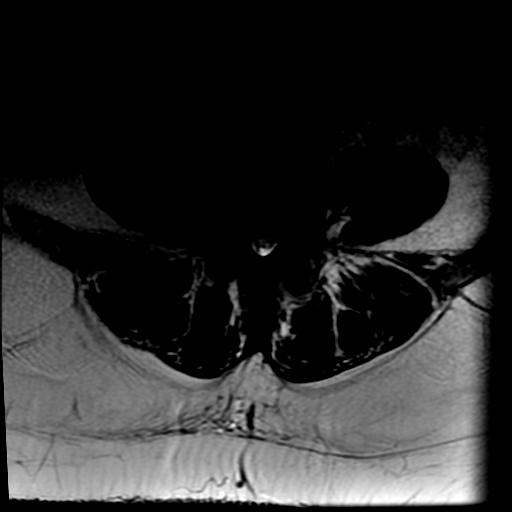
[im 7/16]
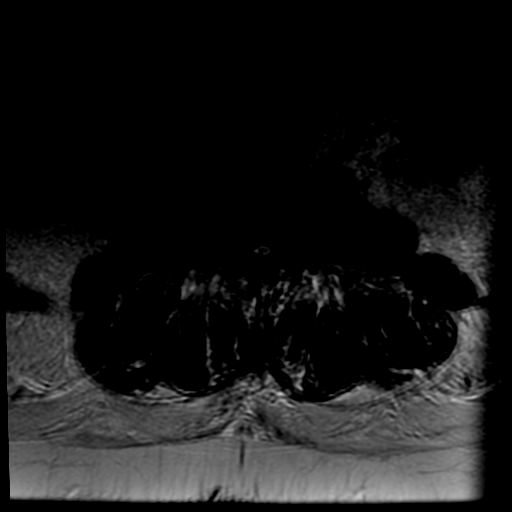
[im 10/16]
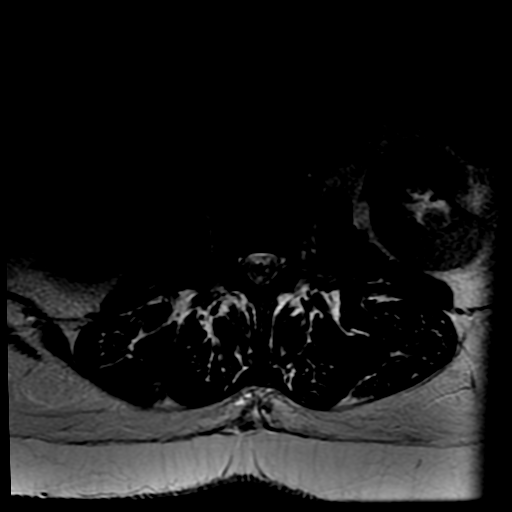
[im 13/16]
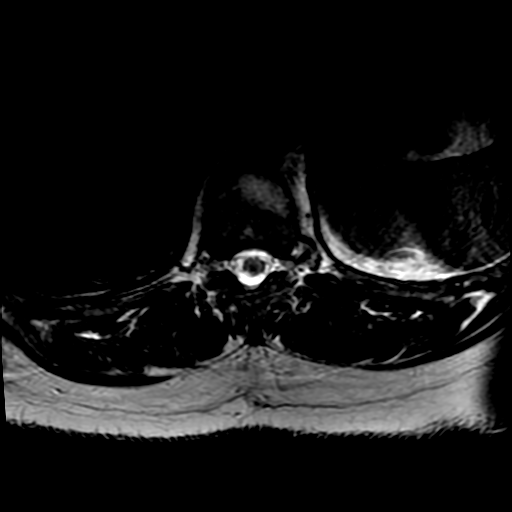
[im 16/16]
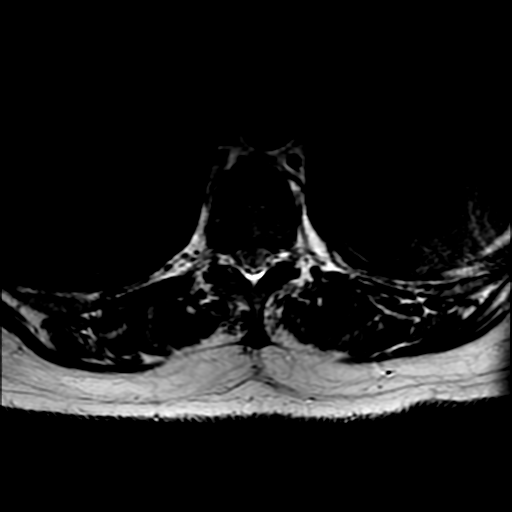

[Series 6: T2 · axial · 4.0mm · 0.39mm/px · z∈[-41,+222]mm · 6 of 40 slices shown (3 of 3)]
[im 1/40]
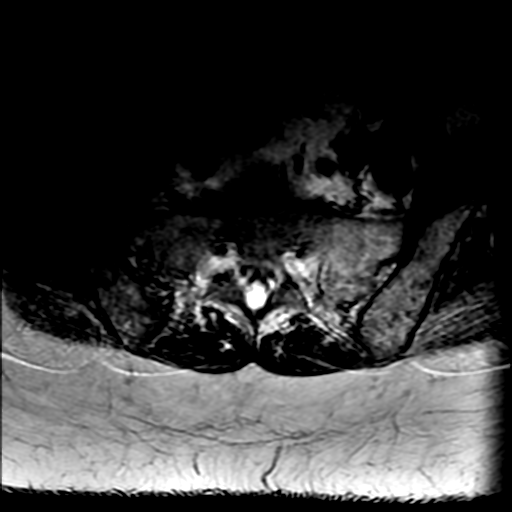
[im 6/40]
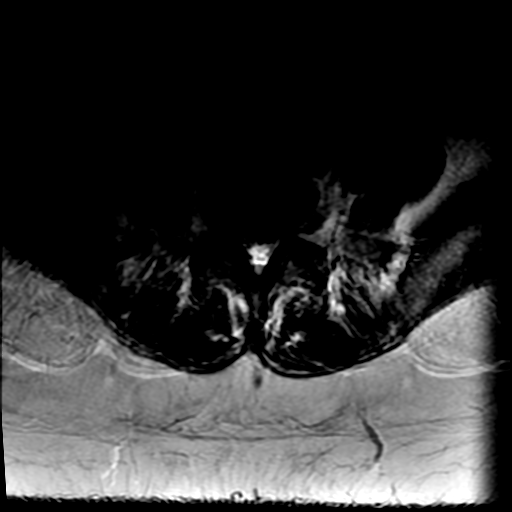
[im 12/40]
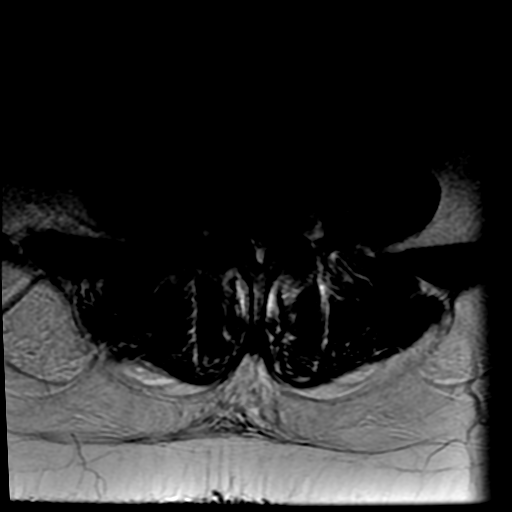
[im 17/40]
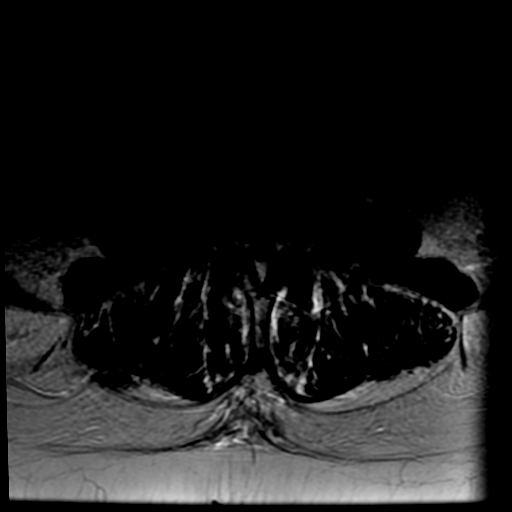
[im 20/40]
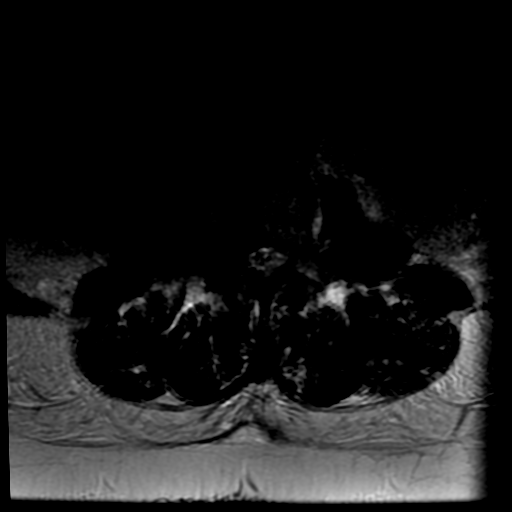
[im 34/40]
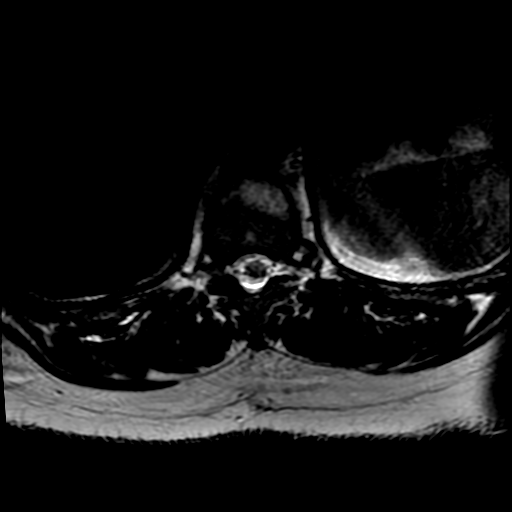

[19 of 48 positions shown; findings below may reference images not displayed]

FINDINGS: Multiple sequences are moderately motion degraded.

SEGMENTATION: For the purposes of this report, the last well-formed
intervertebral disc will be described as L5-S1.

ALIGNMENT: No malalignment.  Maintenance of the lumbar lordosis.

VERTEBRAE:Lumbar vertebral bodies are intact. Mild L3-4 and moderate
L5-S1 disc height loss, with decreased T2 signal within the L3-4 and
L5-S1 disc compatible with desiccation. Multilevel mild subacute on
chronic discogenic endplate changes. Moderate acute to subacute
discogenic endplate changes T11-12. No suspicious bone marrow
signal.

CONUS MEDULLARIS: Conus medullaris terminates at L1-2 and
demonstrates normal morphology and signal characteristics. Limited
assessment of cauda equina due to moderately motion degraded axial
sequences.

PARASPINAL AND SOFT TISSUES: Included prevertebral and paraspinal
soft tissues are normal.

Due to motion, axial sequences are do not reliably coregister to the
sagittal sequences and, are nondiagnostic.

At L5-S1 is a small broad-based disc bulge. Mild canal stenosis
suspected at L3-4 and L4-5. Mild to moderate L5-S1 neural foraminal
narrowing. Moderate lower lumbar facet arthropathy better seen on
yesterday's CT.
IMPRESSION: Motion degraded examination, nondiagnostic axial sequences.

No acute fracture or malalignment.

Mild canal stenosis suspected at L3-4 and L4-5. Mild to moderate
L5-S1 neural foraminal narrowing.

## 2017-08-21 IMAGING — CR DG ABD PORTABLE 1V
1 series · 1 of 1 positions shown · non-contrast
Comparison: August 08, 2013

CLINICAL DATA: Postoperative evaluation; status post lower
extremity bypass grafting

EXAM:
PORTABLE ABDOMEN - 1 VIEW

[AP]
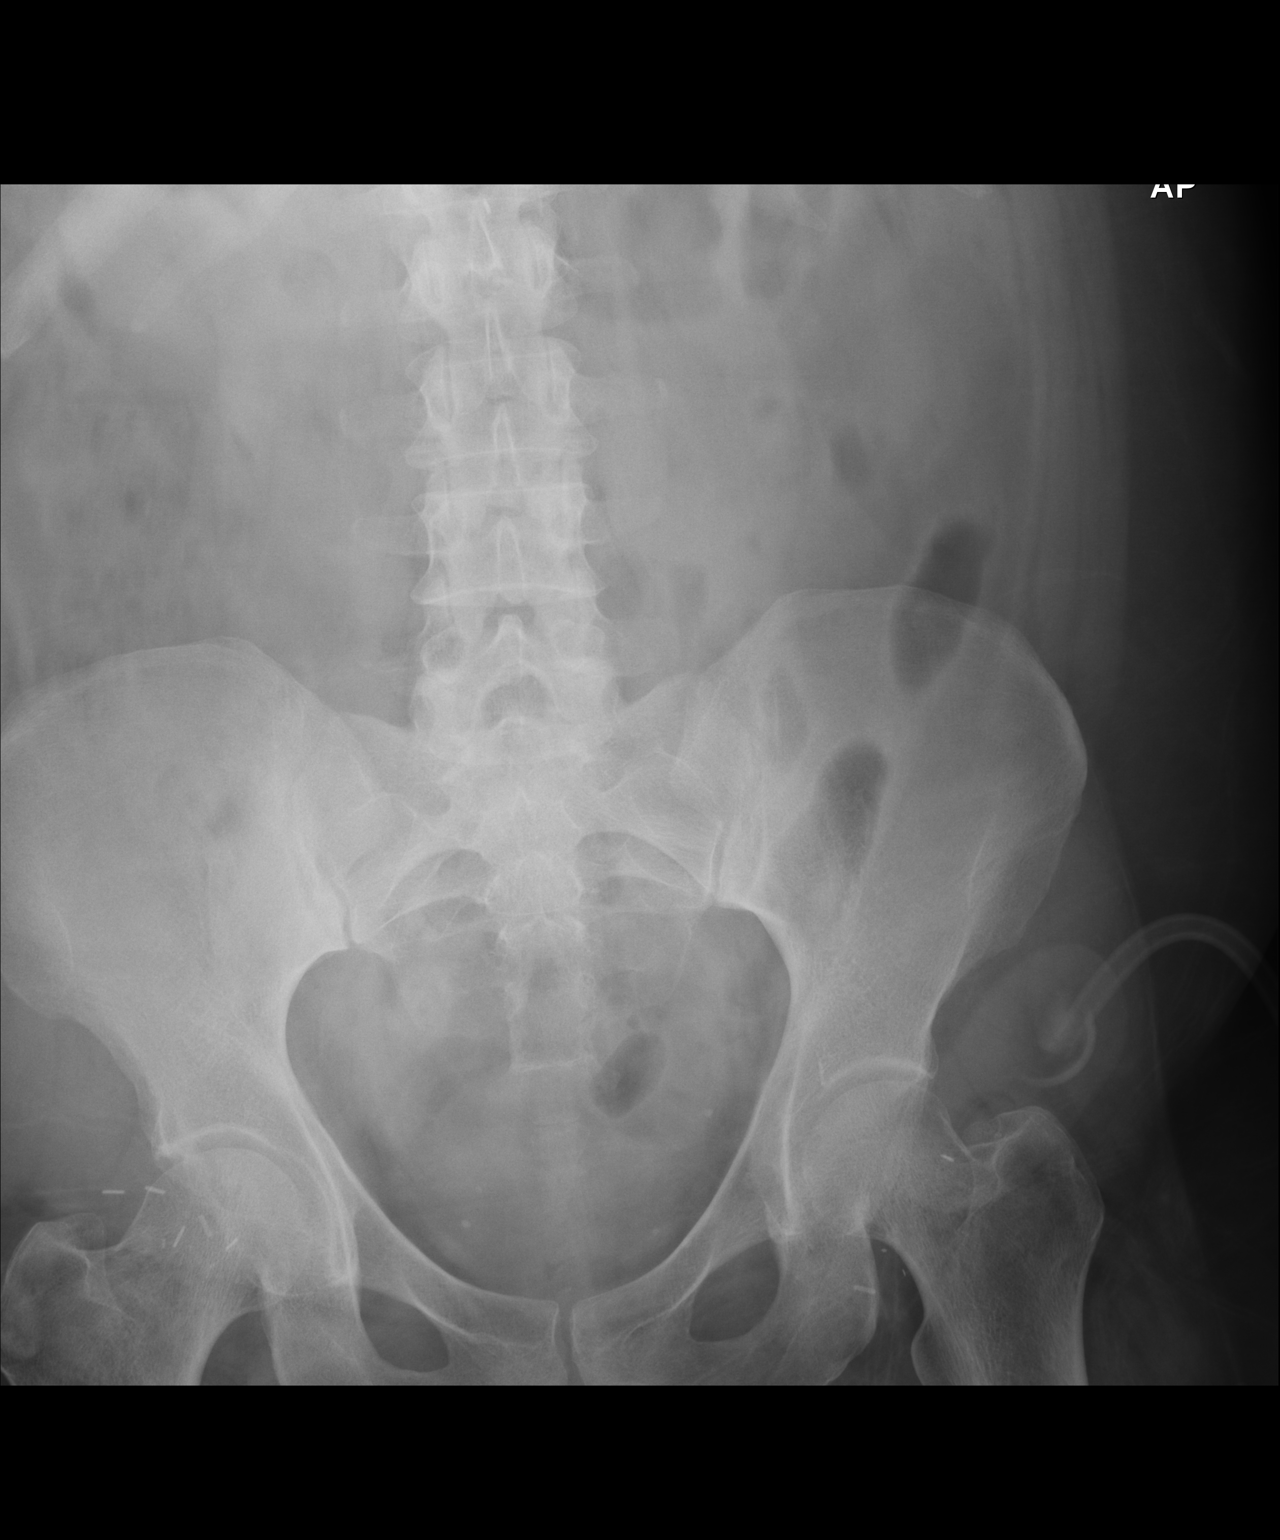

[1 of 1 positions shown; findings below may reference images not displayed]

FINDINGS: There is moderate stool throughout the colon. No bowel dilatation or
air-fluid level suggesting bowel obstruction. No free air. There are
phleboliths in the pelvis. There is a drain lateral to the left hip.
There are surgical clips overlying each hip joint region.
IMPRESSION: Bowel gas pattern unremarkable.  Drain lateral to left hip.

## 2017-08-21 IMAGING — CR DG CHEST 1V PORT
1 series · 1 of 1 positions shown · non-contrast
Comparison: 06/18/2016

CLINICAL DATA: Status post bypass graft to restore lower extremity
circulation.

EXAM:
PORTABLE CHEST 1 VIEW

[AP]
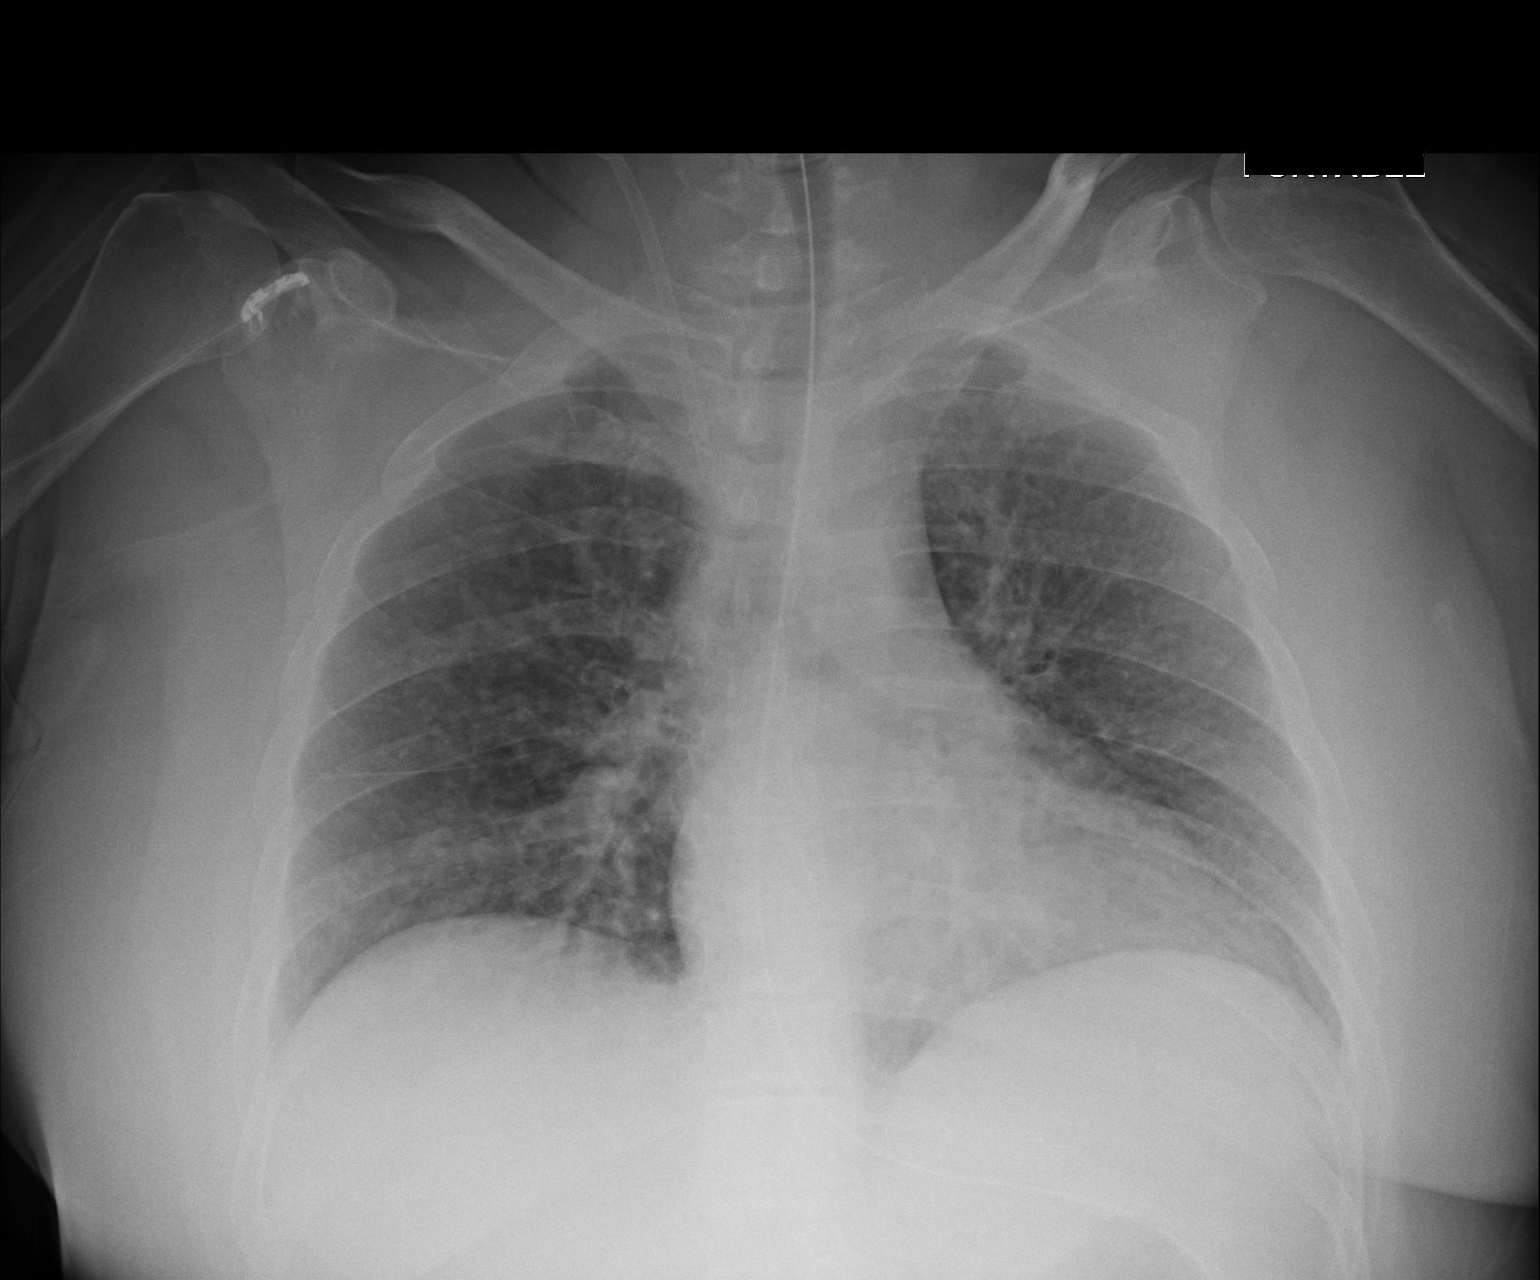

[1 of 1 positions shown; findings below may reference images not displayed]

FINDINGS: Enteric catheter is seen with side hole overlying the expected
location of gastric body. Right internal jugular approach central
venous catheter sheath is in place terminating in the proximal
superior vena cava.

Cardiomediastinal silhouette is normal. Mediastinal contours appear
intact.

There is no evidence of focal airspace consolidation, pleural
effusion or pneumothorax. Low lung volumes.

Osseous structures are without acute abnormality. Soft tissues are
grossly normal.
IMPRESSION: Low lung volumes otherwise no evidence of acute cardiopulmonary
process.

## 2017-09-30 IMAGING — CR DG CHEST 2V
2 series · 2 of 2 positions shown · non-contrast
Comparison: Chest radiograph 06/29/2016

CLINICAL DATA: Chest pain

EXAM:
CHEST  2 VIEW

[chest pa]
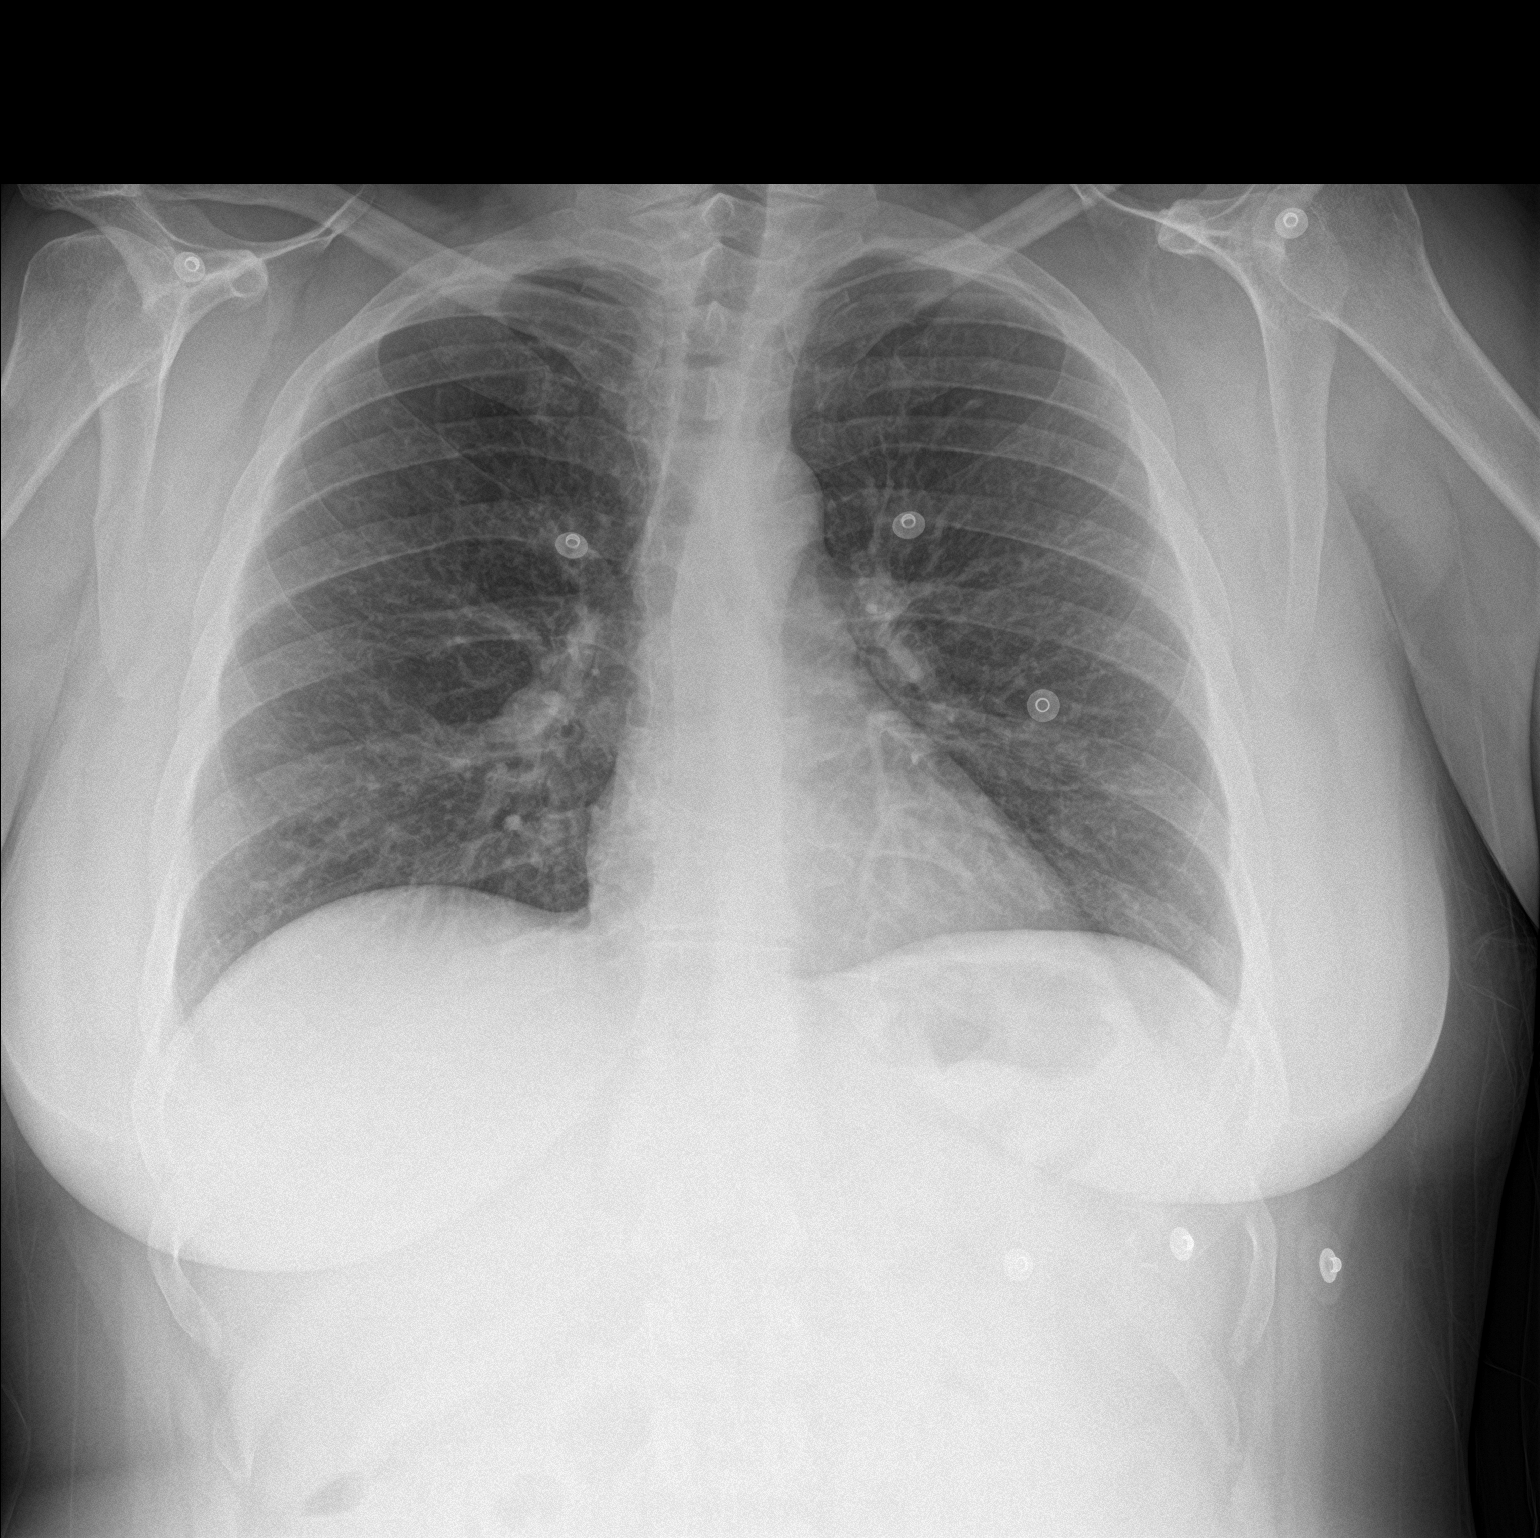

[chest lat]
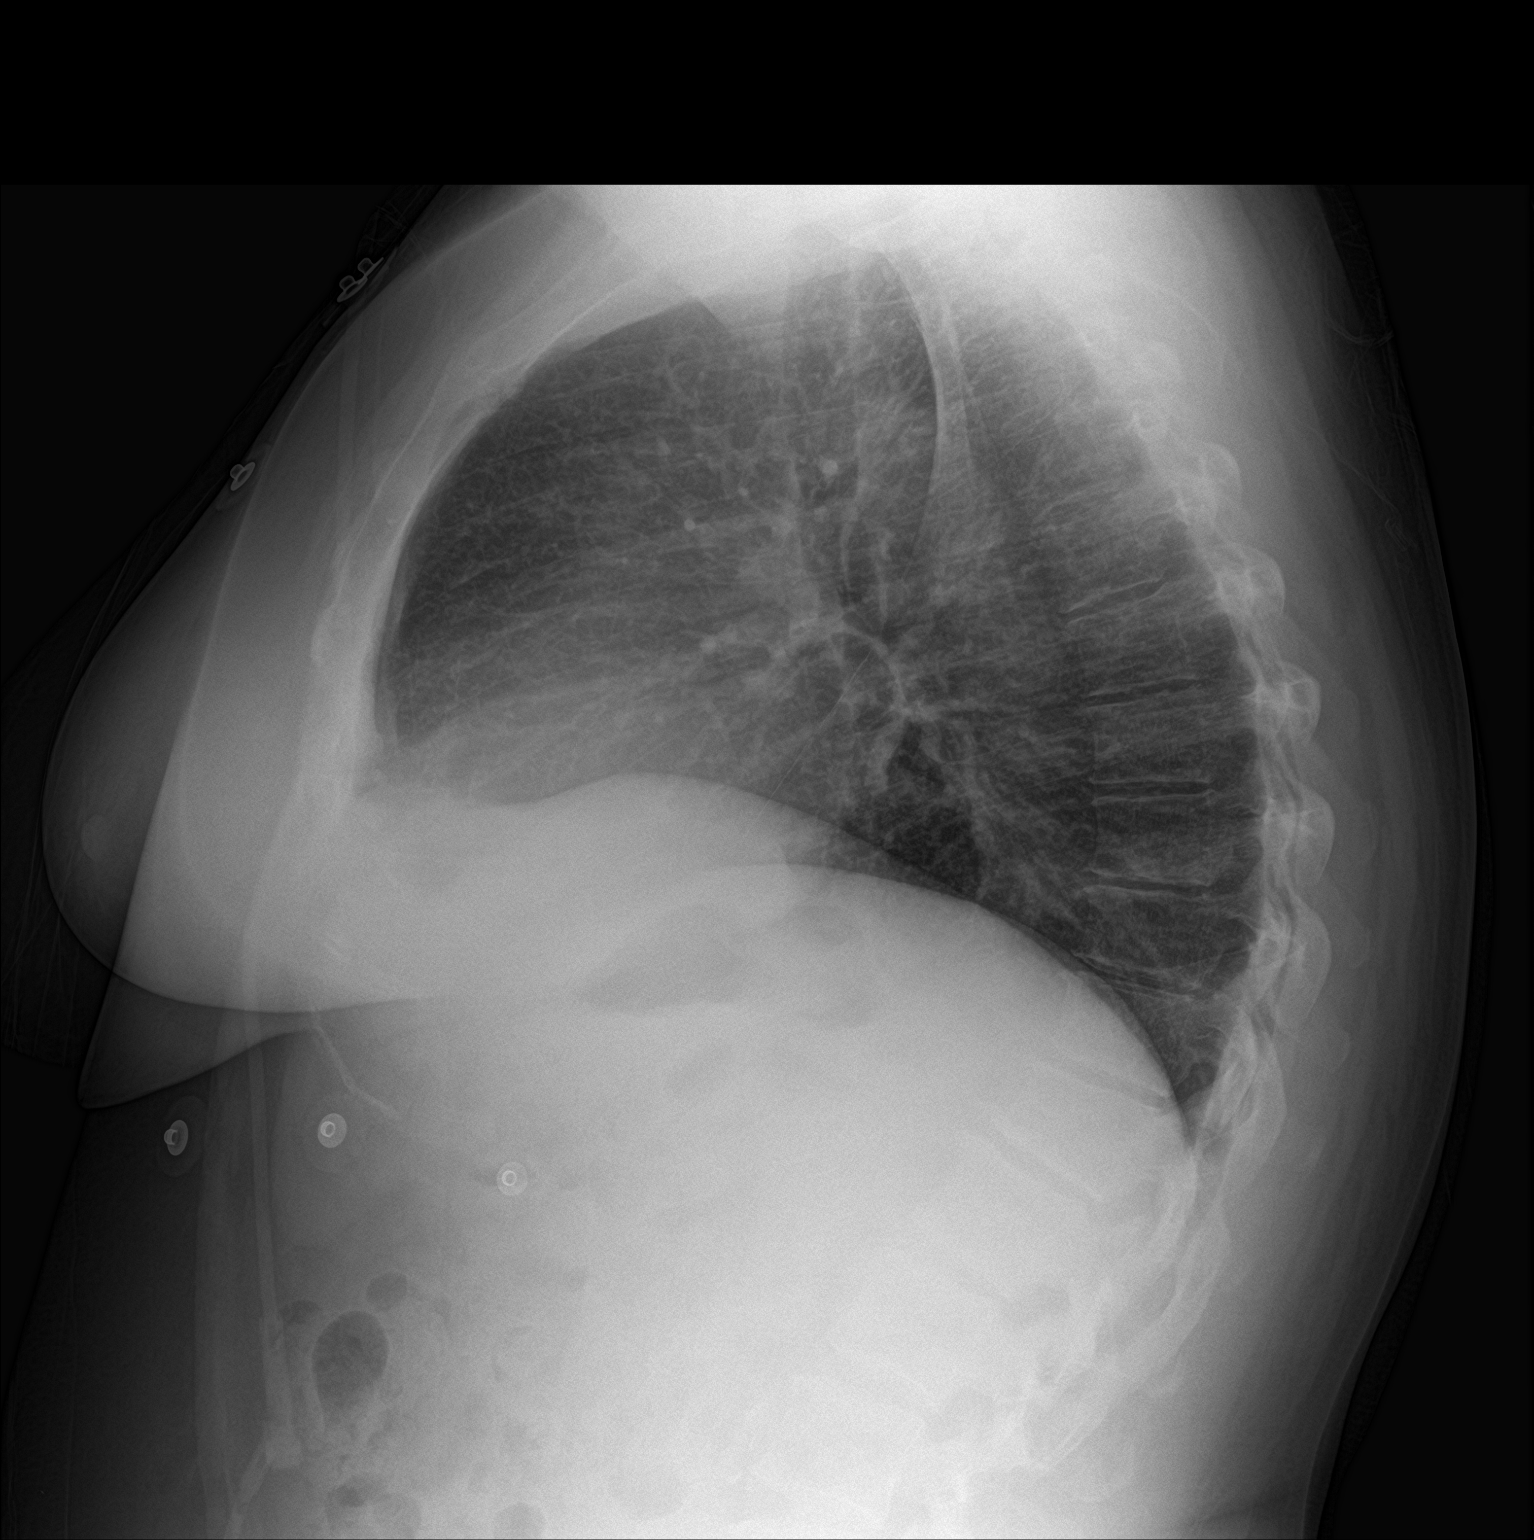

[2 of 2 positions shown; findings below may reference images not displayed]

FINDINGS: Shallow lung inflation. Cardiomediastinal contours are normal. No
pneumothorax or pleural effusion.

No focal airspace consolidation or pulmonary edema.
IMPRESSION: Shallow lung inflation without focal airspace disease.

## 2018-01-22 ENCOUNTER — Encounter (HOSPITAL_COMMUNITY): Payer: Self-pay | Admitting: *Deleted

## 2018-01-22 ENCOUNTER — Emergency Department (HOSPITAL_COMMUNITY)
Admission: EM | Admit: 2018-01-22 | Discharge: 2018-01-22 | Discharge status: Against Medical Advice | Payer: Self-pay | Attending: Emergency Medicine | Admitting: Emergency Medicine

## 2018-01-22 DIAGNOSIS — Y929 Unspecified place or not applicable: Secondary | ICD-10-CM | POA: Insufficient documentation

## 2018-01-22 DIAGNOSIS — Z5321 Procedure and treatment not carried out due to patient leaving prior to being seen by health care provider: Secondary | ICD-10-CM | POA: Insufficient documentation

## 2018-01-22 DIAGNOSIS — Y939 Activity, unspecified: Secondary | ICD-10-CM | POA: Insufficient documentation

## 2018-01-22 DIAGNOSIS — W5511XA Bitten by horse, initial encounter: Secondary | ICD-10-CM | POA: Insufficient documentation

## 2018-01-22 DIAGNOSIS — Y999 Unspecified external cause status: Secondary | ICD-10-CM | POA: Insufficient documentation

## 2018-01-22 NOTE — ED Notes (Signed)
No answer x1

## 2018-01-22 NOTE — ED Notes (Signed)
Called for second time  No response from lobby 

## 2018-01-22 NOTE — ED Triage Notes (Signed)
Pt stated "my horse bit me, then threw me up against the wall, and something in my thumb popped."  Pt's bite is located to LUE, left thoracic bruise and right thumb.

## 2018-01-22 NOTE — ED Notes (Signed)
No answer x2 

## 2018-09-22 ENCOUNTER — Other Ambulatory Visit (HOSPITAL_COMMUNITY): Payer: Self-pay | Admitting: Respiratory Therapy

## 2018-09-22 DIAGNOSIS — F17209 Nicotine dependence, unspecified, with unspecified nicotine-induced disorders: Secondary | ICD-10-CM

## 2018-09-28 ENCOUNTER — Ambulatory Visit (HOSPITAL_COMMUNITY)
Admission: RE | Admit: 2018-09-28 | Discharge: 2018-09-28 | Disposition: A | Discharge status: Home / Self Care | Payer: Medicaid Other | Source: Ambulatory Visit | Attending: Family Medicine | Admitting: Family Medicine

## 2018-09-28 DIAGNOSIS — F17209 Nicotine dependence, unspecified, with unspecified nicotine-induced disorders: Secondary | ICD-10-CM | POA: Insufficient documentation

## 2018-09-28 LAB — SPIROMETRY WITH GRAPH
FEF 25-75 PRE: 2.17 L/s
FEF2575-%Pred-Pre: 73 %
FEV1-%PRED-PRE: 85 %
FEV1-Pre: 2.64 L
FEV1FVC-%PRED-PRE: 94 %
FEV6-%Pred-Pre: 91 %
FEV6-Pre: 3.47 L
FEV6FVC-%PRED-PRE: 102 %
FVC-%Pred-Pre: 88 %
FVC-Pre: 3.48 L
PRE FEV1/FVC RATIO: 76 %
Pre FEV6/FVC Ratio: 100 %

## 2018-09-29 ENCOUNTER — Encounter (HOSPITAL_COMMUNITY): Payer: Self-pay

## 2018-10-20 NOTE — H&P (Signed)
HISTORY AND PHYSICAL  Frances Graham is a 53 y.o. female patient with CC: painful teeth  No diagnosis found.  Past Medical History:  Diagnosis Date  . Anxiety    panic attacks- has then post op  . Asthma    as a child  . Attention deficit disorder   . Essential hypertension   . PAD (peripheral artery disease) (HCC)    Infrarenal aortic occlusion  . Temporomandibular joint disorder     No current facility-administered medications for this encounter.    Current Outpatient Medications  Medication Sig Dispense Refill  . ibuprofen (ADVIL,MOTRIN) 200 MG tablet Take 600-800 mg by mouth every 8 (eight) hours as needed (pain.).    Marland Kitchen methylphenidate (RITALIN) 20 MG tablet Take 20 mg by mouth See admin instructions. Take 1 tablet (20 mg) by mouth up to 3 times daily for focus (DOES NOT TAKE ON WEEKENDS)     Allergies  Allergen Reactions  . Bee Venom Anaphylaxis  . Penicillins Anaphylaxis    Has patient had a PCN reaction causing immediate rash, facial/tongue/throat swelling, SOB or lightheadedness with hypotension: Yes Has patient had a PCN reaction causing severe rash involving mucus membranes or skin necrosis: No Has patient had a PCN reaction that required hospitalization Yes Has patient had a PCN reaction occurring within the last 10 years: No If all of the above answers are "NO", then may proceed with Cephalosporin use.   . Benadryl [Diphenhydramine Hcl] Other (See Comments)    syncope  . Celebrex [Celecoxib] Nausea And Vomiting and Rash  . Keflex [Cephalexin] Nausea And Vomiting, Rash and Other (See Comments)    Projectile vomiting, thrush, fever blisters, ED visit  . Lidocaine Swelling and Rash    Foot swelled up like a balloon  . Codeine Other (See Comments)    Reflux. Pt states not allergic to hydrocodone and oxycodone .   Active Problems:   * No active hospital problems. *  Vitals: There were no vitals taken for this visit. Lab results:No results found for this or  any previous visit (from the past 24 hour(s)). Radiology Results: No results found. General appearance: alert, cooperative and no distress Head: Normocephalic, without obvious abnormality, atraumatic Eyes: negative Nose: Nares normal. Septum midline. Mucosa normal. No drainage or sinus tenderness. Throat: multiple dental caries, no trismus, purulence, edema. Pharynx clear Neck: no adenopathy, supple, symmetrical, trachea midline and thyroid not enlarged, symmetric, no tenderness/mass/nodules Resp: clear to auscultation bilaterally Cardio: regular rate and rhythm, S1, S2 normal, no murmur, click, rub or gallop  Assessment: All teeth non-restorable secondary to dental caries and periodontitis.   Plan: Full dental extractions with GA. Day surgery.    Ocie Doyne 10/20/2018

## 2018-10-22 ENCOUNTER — Encounter (HOSPITAL_COMMUNITY): Payer: Self-pay | Admitting: *Deleted

## 2018-10-22 ENCOUNTER — Other Ambulatory Visit: Payer: Self-pay

## 2018-10-22 NOTE — Anesthesia Preprocedure Evaluation (Addendum)
Anesthesia Evaluation  Patient identified by MRN, date of birth, ID band Patient awake    Reviewed: Allergy & Precautions, NPO status , Patient's Chart, lab work & pertinent test results  Airway Mallampati: II  TM Distance: >3 FB Neck ROM: Full    Dental  (+) Poor Dentition, Missing, Loose   Pulmonary asthma , Current Smoker,    Pulmonary exam normal breath sounds clear to auscultation       Cardiovascular hypertension, + Peripheral Vascular Disease  Normal cardiovascular exam Rhythm:Regular Rate:Normal  ECG: NSR, rate 81   Neuro/Psych  Headaches, PSYCHIATRIC DISORDERS Anxiety Attention deficit disorder   GI/Hepatic negative GI ROS, Neg liver ROS,   Endo/Other  negative endocrine ROS  Renal/GU negative Renal ROS     Musculoskeletal negative musculoskeletal ROS (+)   Abdominal   Peds  Hematology negative hematology ROS (+)   Anesthesia Other Findings Dental caries  Reproductive/Obstetrics hcg negative                           Anesthesia Physical Anesthesia Plan  ASA: II  Anesthesia Plan: General   Post-op Pain Management:    Induction: Intravenous  PONV Risk Score and Plan: 2 and Ondansetron, Dexamethasone, Midazolam and Treatment may vary due to age or medical condition  Airway Management Planned: Nasal ETT  Additional Equipment:   Intra-op Plan:   Post-operative Plan: Extubation in OR  Informed Consent: I have reviewed the patients History and Physical, chart, labs and discussed the procedure including the risks, benefits and alternatives for the proposed anesthesia with the patient or authorized representative who has indicated his/her understanding and acceptance.     Dental advisory given  Plan Discussed with: CRNA  Anesthesia Plan Comments: (Seen by cardiology in 2017 while admitted for limb ischemia requiring aortobifemoral bypass. ECG at that time read as  nonspecific.  Myoview was  low risk without reversible ischemia, abnormal WM --> likely due to gut uptake. Echo showed LV EF of 60-65%, no wm abnormality. No cardiology followup was advised. )       Anesthesia Quick Evaluation

## 2018-10-22 NOTE — Progress Notes (Signed)
Frances Graham denies chest pain or shortness of breath.  Patient has a history of HTN, she has been off medication for over 2 years, "I lost 68 lbs."  PCP is Dr Jeannetta Nap, [place].

## 2018-10-23 ENCOUNTER — Ambulatory Visit (HOSPITAL_COMMUNITY): Payer: Medicaid Other | Admitting: Physician Assistant

## 2018-10-23 ENCOUNTER — Encounter (HOSPITAL_COMMUNITY)
Admission: RE | Disposition: A | Discharge status: Home / Self Care | Payer: Self-pay | Source: Home / Self Care | Attending: Oral Surgery

## 2018-10-23 ENCOUNTER — Ambulatory Visit (HOSPITAL_COMMUNITY)
Admission: RE | Admit: 2018-10-23 | Discharge: 2018-10-23 | Disposition: A | Discharge status: Home / Self Care | Payer: Medicaid Other | Attending: Oral Surgery | Admitting: Oral Surgery

## 2018-10-23 ENCOUNTER — Encounter (HOSPITAL_COMMUNITY): Payer: Self-pay

## 2018-10-23 DIAGNOSIS — K029 Dental caries, unspecified: Secondary | ICD-10-CM | POA: Diagnosis not present

## 2018-10-23 DIAGNOSIS — K011 Impacted teeth: Secondary | ICD-10-CM | POA: Insufficient documentation

## 2018-10-23 DIAGNOSIS — F17200 Nicotine dependence, unspecified, uncomplicated: Secondary | ICD-10-CM | POA: Insufficient documentation

## 2018-10-23 DIAGNOSIS — K0530 Chronic periodontitis, unspecified: Secondary | ICD-10-CM | POA: Diagnosis not present

## 2018-10-23 DIAGNOSIS — I739 Peripheral vascular disease, unspecified: Secondary | ICD-10-CM | POA: Insufficient documentation

## 2018-10-23 DIAGNOSIS — Z79899 Other long term (current) drug therapy: Secondary | ICD-10-CM | POA: Diagnosis not present

## 2018-10-23 DIAGNOSIS — I10 Essential (primary) hypertension: Secondary | ICD-10-CM | POA: Diagnosis not present

## 2018-10-23 HISTORY — DX: Headache, unspecified: R51.9

## 2018-10-23 HISTORY — DX: Headache: R51

## 2018-10-23 HISTORY — PX: TOOTH EXTRACTION: SHX859

## 2018-10-23 HISTORY — DX: Personal history of urinary calculi: Z87.442

## 2018-10-23 LAB — BASIC METABOLIC PANEL
Anion gap: 10 (ref 5–15)
BUN: 8 mg/dL (ref 6–20)
CO2: 20 mmol/L — AB (ref 22–32)
Calcium: 8.9 mg/dL (ref 8.9–10.3)
Chloride: 106 mmol/L (ref 98–111)
Creatinine, Ser: 0.6 mg/dL (ref 0.44–1.00)
GFR calc Af Amer: >60 mL/min (ref >60)
GFR calc non Af Amer: >60 mL/min (ref >60)
GLUCOSE: 98 mg/dL (ref 70–99)
Potassium: 4.4 mmol/L (ref 3.5–5.1)
Sodium: 136 mmol/L (ref 135–145)

## 2018-10-23 LAB — CBC
HEMATOCRIT: 42.8 % (ref 36.0–46.0)
Hemoglobin: 14 g/dL (ref 12.0–15.0)
MCH: 31.8 pg (ref 26.0–34.0)
MCHC: 32.7 g/dL (ref 30.0–36.0)
MCV: 97.3 fL (ref 80.0–100.0)
Platelets: 209 10*3/uL (ref 150–400)
RBC: 4.4 MIL/uL (ref 3.87–5.11)
RDW: 12.8 % (ref 11.5–15.5)
WBC: 8.7 10*3/uL (ref 4.0–10.5)
nRBC: 0 % (ref 0.0–0.2)

## 2018-10-23 LAB — POCT PREGNANCY, URINE: Preg Test, Ur: NEGATIVE

## 2018-10-23 SURGERY — DENTAL RESTORATION/EXTRACTIONS
Anesthesia: General | Laterality: Bilateral

## 2018-10-23 MED ORDER — SUGAMMADEX SODIUM 200 MG/2ML IV SOLN
INTRAVENOUS | Status: DC | PRN
  Administered 2018-10-23: 200 mg via INTRAVENOUS

## 2018-10-23 MED ORDER — ACETAMINOPHEN 10 MG/ML IV SOLN
INTRAVENOUS | Status: AC
  Filled 2018-10-23: qty 100

## 2018-10-23 MED ORDER — ROCURONIUM BROMIDE 50 MG/5ML IV SOSY
PREFILLED_SYRINGE | INTRAVENOUS | Status: AC
  Filled 2018-10-23: qty 5

## 2018-10-23 MED ORDER — DEXAMETHASONE SODIUM PHOSPHATE 10 MG/ML IJ SOLN
INTRAMUSCULAR | Status: DC | PRN
  Administered 2018-10-23: 10 mg via INTRAVENOUS

## 2018-10-23 MED ORDER — FENTANYL CITRATE (PF) 100 MCG/2ML IJ SOLN
INTRAMUSCULAR | Status: DC | PRN
  Administered 2018-10-23 (×5): 50 ug via INTRAVENOUS

## 2018-10-23 MED ORDER — OXYCODONE-ACETAMINOPHEN 5-325 MG PO TABS: 1.0000 | ORAL_TABLET | ORAL | 0 refills | Status: DC | PRN

## 2018-10-23 MED ORDER — LACTATED RINGERS IV SOLN
INTRAVENOUS | Status: DC | PRN
  Administered 2018-10-23: 08:00:00 via INTRAVENOUS

## 2018-10-23 MED ORDER — ROCURONIUM BROMIDE 50 MG/5ML IV SOSY
PREFILLED_SYRINGE | INTRAVENOUS | Status: DC | PRN
  Administered 2018-10-23: 50 mg via INTRAVENOUS

## 2018-10-23 MED ORDER — PROPOFOL 10 MG/ML IV BOLUS
INTRAVENOUS | Status: AC
  Filled 2018-10-23: qty 20

## 2018-10-23 MED ORDER — HYDROMORPHONE HCL 1 MG/ML IJ SOLN
INTRAMUSCULAR | Status: AC
  Administered 2018-10-23: 0.5 mg via INTRAVENOUS
  Filled 2018-10-23: qty 1

## 2018-10-23 MED ORDER — PROMETHAZINE HCL 25 MG/ML IJ SOLN: 6.2500 mg | INTRAMUSCULAR | Status: DC | PRN

## 2018-10-23 MED ORDER — LIDOCAINE 2% (20 MG/ML) 5 ML SYRINGE
INTRAMUSCULAR | Status: AC
  Filled 2018-10-23: qty 5

## 2018-10-23 MED ORDER — LIDOCAINE-EPINEPHRINE 2 %-1:100000 IJ SOLN
INTRAMUSCULAR | Status: DC | PRN
  Administered 2018-10-23: 20 mL via INTRADERMAL

## 2018-10-23 MED ORDER — OXYCODONE-ACETAMINOPHEN 5-325 MG PO TABS
ORAL_TABLET | ORAL | Status: AC
  Filled 2018-10-23: qty 1

## 2018-10-23 MED ORDER — HYDROMORPHONE HCL 1 MG/ML IJ SOLN
INTRAMUSCULAR | Status: AC
  Filled 2018-10-23: qty 1

## 2018-10-23 MED ORDER — MIDAZOLAM HCL 5 MG/5ML IJ SOLN
INTRAMUSCULAR | Status: DC | PRN
  Administered 2018-10-23: 2 mg via INTRAVENOUS

## 2018-10-23 MED ORDER — LACTATED RINGERS IV SOLN: INTRAVENOUS | Status: DC

## 2018-10-23 MED ORDER — HYDROMORPHONE HCL 1 MG/ML IJ SOLN: 0.2500 mg | INTRAMUSCULAR | Status: DC | PRN

## 2018-10-23 MED ORDER — OXYCODONE-ACETAMINOPHEN 5-325 MG PO TABS
1.0000 | ORAL_TABLET | Freq: Once | ORAL | Status: AC
  Administered 2018-10-23: 1 via ORAL

## 2018-10-23 MED ORDER — FENTANYL CITRATE (PF) 250 MCG/5ML IJ SOLN
INTRAMUSCULAR | Status: AC
  Filled 2018-10-23: qty 5

## 2018-10-23 MED ORDER — CLINDAMYCIN HCL 300 MG PO CAPS: 300.0000 mg | ORAL_CAPSULE | Freq: Three times a day (TID) | ORAL | 0 refills | Status: DC

## 2018-10-23 MED ORDER — MIDAZOLAM HCL 2 MG/2ML IJ SOLN
INTRAMUSCULAR | Status: AC
  Filled 2018-10-23: qty 2

## 2018-10-23 MED ORDER — LIDOCAINE 2% (20 MG/ML) 5 ML SYRINGE
INTRAMUSCULAR | Status: DC | PRN
  Administered 2018-10-23: 80 mg via INTRAVENOUS

## 2018-10-23 MED ORDER — ONDANSETRON HCL 4 MG/2ML IJ SOLN
INTRAMUSCULAR | Status: AC
  Filled 2018-10-23: qty 2

## 2018-10-23 MED ORDER — DEXAMETHASONE SODIUM PHOSPHATE 10 MG/ML IJ SOLN
INTRAMUSCULAR | Status: AC
  Filled 2018-10-23: qty 1

## 2018-10-23 MED ORDER — ONDANSETRON HCL 4 MG/2ML IJ SOLN
INTRAMUSCULAR | Status: DC | PRN
  Administered 2018-10-23: 4 mg via INTRAVENOUS

## 2018-10-23 MED ORDER — ACETAMINOPHEN 10 MG/ML IV SOLN
1000.0000 mg | Freq: Once | INTRAVENOUS | Status: AC
  Administered 2018-10-23: 1000 mg via INTRAVENOUS

## 2018-10-23 MED ORDER — OXYMETAZOLINE HCL 0.05 % NA SOLN
NASAL | Status: AC
  Filled 2018-10-23: qty 15

## 2018-10-23 MED ORDER — 0.9 % SODIUM CHLORIDE (POUR BTL) OPTIME
TOPICAL | Status: DC | PRN
  Administered 2018-10-23: 1000 mL

## 2018-10-23 MED ORDER — LIDOCAINE-EPINEPHRINE 2 %-1:100000 IJ SOLN
INTRAMUSCULAR | Status: AC
  Filled 2018-10-23: qty 1

## 2018-10-23 MED ORDER — OXYMETAZOLINE HCL 0.05 % NA SOLN
NASAL | Status: DC | PRN
  Administered 2018-10-23 (×2): 2 via NASAL

## 2018-10-23 MED ORDER — OXYMETAZOLINE HCL 0.05 % NA SOLN
NASAL | Status: DC | PRN
  Administered 2018-10-23: 1

## 2018-10-23 MED ORDER — PROPOFOL 10 MG/ML IV BOLUS
INTRAVENOUS | Status: DC | PRN
  Administered 2018-10-23: 150 mg via INTRAVENOUS

## 2018-10-23 MED ORDER — SODIUM CHLORIDE 0.9 % IV SOLN
INTRAVENOUS | Status: AC | PRN
  Administered 2018-10-23: 1000 mL via INTRAMUSCULAR

## 2018-10-23 MED ORDER — HYDROMORPHONE HCL 1 MG/ML IJ SOLN
0.2500 mg | INTRAMUSCULAR | Status: DC | PRN
  Administered 2018-10-23 (×2): 0.5 mg via INTRAVENOUS

## 2018-10-23 MED ORDER — HYDROMORPHONE HCL 1 MG/ML IJ SOLN
0.2500 mg | INTRAMUSCULAR | Status: DC | PRN
  Administered 2018-10-23 (×4): 0.5 mg via INTRAVENOUS

## 2018-10-23 MED ORDER — CLINDAMYCIN PHOSPHATE 600 MG/50ML IV SOLN
600.0000 mg | INTRAVENOUS | Status: AC
  Administered 2018-10-23: 600 mg via INTRAVENOUS
  Filled 2018-10-23: qty 50

## 2018-10-23 SURGICAL SUPPLY — 39 items
BLADE SURG 15 STRL LF DISP TIS (BLADE) ×1 IMPLANT
BLADE SURG 15 STRL SS (BLADE) ×3
BUR CROSS CUT FISSURE 1.6 (BURR) ×2 IMPLANT
BUR CROSS CUT FISSURE 1.6MM (BURR) ×1
BUR EGG ELITE 4.0 (BURR) ×2 IMPLANT
BUR EGG ELITE 4.0MM (BURR) ×1
CANISTER SUCT 3000ML PPV (MISCELLANEOUS) ×3 IMPLANT
COVER SURGICAL LIGHT HANDLE (MISCELLANEOUS) ×3 IMPLANT
COVER WAND RF STERILE (DRAPES) ×1 IMPLANT
DECANTER SPIKE VIAL GLASS SM (MISCELLANEOUS) ×3 IMPLANT
DRAPE U-SHAPE 76X120 STRL (DRAPES) ×3 IMPLANT
GAUZE PACKING FOLDED 2  STR (GAUZE/BANDAGES/DRESSINGS) ×2
GAUZE PACKING FOLDED 2 STR (GAUZE/BANDAGES/DRESSINGS) ×1 IMPLANT
GLOVE BIO SURGEON STRL SZ 6.5 (GLOVE) IMPLANT
GLOVE BIO SURGEON STRL SZ7 (GLOVE) ×2 IMPLANT
GLOVE BIO SURGEON STRL SZ7.5 (GLOVE) ×3 IMPLANT
GLOVE BIO SURGEONS STRL SZ 6.5 (GLOVE)
GLOVE BIOGEL PI IND STRL 6.5 (GLOVE) IMPLANT
GLOVE BIOGEL PI IND STRL 7.0 (GLOVE) IMPLANT
GLOVE BIOGEL PI INDICATOR 6.5 (GLOVE)
GLOVE BIOGEL PI INDICATOR 7.0 (GLOVE) ×2
GOWN STRL REUS W/ TWL LRG LVL3 (GOWN DISPOSABLE) ×1 IMPLANT
GOWN STRL REUS W/ TWL XL LVL3 (GOWN DISPOSABLE) ×1 IMPLANT
GOWN STRL REUS W/TWL LRG LVL3 (GOWN DISPOSABLE) ×3
GOWN STRL REUS W/TWL XL LVL3 (GOWN DISPOSABLE) ×3
IV NS 1000ML (IV SOLUTION) ×3
IV NS 1000ML BAXH (IV SOLUTION) ×1 IMPLANT
KIT BASIN OR (CUSTOM PROCEDURE TRAY) ×3 IMPLANT
KIT TURNOVER KIT B (KITS) ×3 IMPLANT
NEEDLE 22X1 1/2 (OR ONLY) (NEEDLE) ×4 IMPLANT
NS IRRIG 1000ML POUR BTL (IV SOLUTION) ×3 IMPLANT
PAD ARMBOARD 7.5X6 YLW CONV (MISCELLANEOUS) ×3 IMPLANT
SLEEVE IRRIGATION ELITE 7 (MISCELLANEOUS) ×3 IMPLANT
SPONGE SURGIFOAM ABS GEL 12-7 (HEMOSTASIS) IMPLANT
SUT CHROMIC 3 0 PS 2 (SUTURE) ×5 IMPLANT
SYR CONTROL 10ML LL (SYRINGE) ×3 IMPLANT
TRAY ENT MC OR (CUSTOM PROCEDURE TRAY) ×3 IMPLANT
TUBING IRRIGATION (MISCELLANEOUS) ×3 IMPLANT
YANKAUER SUCT BULB TIP NO VENT (SUCTIONS) ×3 IMPLANT

## 2018-10-23 NOTE — Transfer of Care (Signed)
Immediate Anesthesia Transfer of Care Note  Patient: Frances Graham  Procedure(s) Performed: DENTAL RESTORATION/EXTRACTIONS (Bilateral )  Patient Location: PACU  Anesthesia Type:General  Level of Consciousness: awake and patient cooperative  Airway & Oxygen Therapy: Patient Spontanous Breathing and Patient connected to face mask oxygen  Post-op Assessment: Report given to RN, Post -op Vital signs reviewed and stable and Patient moving all extremities X 4  Post vital signs: Reviewed and stable  Last Vitals:  Vitals Value Taken Time  BP    Temp    Pulse 96 10/23/2018 10:35 AM  Resp    SpO2 99 % 10/23/2018 10:35 AM  Vitals shown include unvalidated device data.  Last Pain:  Vitals:   10/23/18 0738  TempSrc:   PainSc: 4       Patients Stated Pain Goal: 2 (10/23/18 4854)  Complications: No apparent anesthesia complications

## 2018-10-23 NOTE — Anesthesia Procedure Notes (Addendum)
Procedure Name: Intubation Date/Time: 10/23/2018 9:40 AM Performed by: Julian ReilWelty, Taneya Conkel F, CRNA Pre-anesthesia Checklist: Patient identified, Emergency Drugs available, Suction available, Timeout performed and Patient being monitored Patient Re-evaluated:Patient Re-evaluated prior to induction Oxygen Delivery Method: Circle system utilized Preoxygenation: Pre-oxygenation with 100% oxygen Induction Type: IV induction Ventilation: Mask ventilation without difficulty Laryngoscope Size: Glidescope and 4 Grade View: Grade I Nasal Tubes: Right and Nasal prep performed Tube size: 7.0 mm Number of attempts: 1 Airway Equipment and Method: Video-laryngoscopy Placement Confirmation: ETT inserted through vocal cords under direct vision,  positive ETCO2 and breath sounds checked- equal and bilateral Tube secured with: Tape Dental Injury: Teeth and Oropharynx as per pre-operative assessment

## 2018-10-23 NOTE — Op Note (Signed)
NAME: Frances Graham, Frances Graham MEDICAL RECORD [id number] ACCOUNT [id number] DATE OF BIRTH:1966/08/29 FACILITY: [facility] LOCATION: [facility] PHYSICIAN:Taren Dymek M. Keiarra Charon, DDS  OPERATIVE REPORT  DATE OF PROCEDURE:  10/23/2018  PREOPERATIVE DIAGNOSIS:  Nonrestorable and impacted teeth numbers, 1, 4, 5, 6, 9, 10, 11, 12, 13, 14, 16, 18, 20, 21, 22, 27, 28, 29.  POSTOPERATIVE DIAGNOSIS:  Nonrestorable and impacted teeth numbers, 1, 4, 5, 6, 9, 10, 11, 12, 13, 14, 18, 20, 21, 22, 27, 28, 29.  PROCEDURE:  Extraction of teeth numbers 1, 4, 5, 6, 9, 10, 11, 12, 13, 14, 18, 20, 21, 22, 27, 28, 29, alveoplasty right and left maxilla and mandible.  SURGEON:  Ocie Doyne, DDS  ANESTHESIA:  General nasal intubation, Dr. Bradley Ferris attending.  DESCRIPTION OF PROCEDURE:  The patient was taken to the operating room and placed on the table in supine position.  General anesthesia was administered intravenously, and a nasal endotracheal tube was placed and secured.  The eyes were protected and the  patient was draped for surgery.  Timeout was performed.  The posterior pharynx was suctioned and a throat pack was placed.  Lidocaine 2% 1:100,000 epinephrine was infiltrated in an inferior alveolar block on the right and left sides and in buccal and  palatal infiltration in the maxilla.  A bite block was placed on the right side of the mouth and a sweetheart retractor was used to retract the tongue.  The left side was operated first.  A #15 blade was used to make an incision around teeth numbers 18,  20, 21, 22 and carried forward along the alveolar crest and across the midline to tooth #27.  The periosteum was reflected.  The teeth were elevated with a 301 elevator and removed from the mouth with the dental forceps.  The periosteum was reflected.   The sockets were curetted.  The gingiva was trimmed, and then alveoplasty was performed using an egg-shaped bur and bone file.  Then, the area was irrigated and closed with 3-0  chromic.  The maxilla 15 blade was used to make an incision overlying tooth  #16 carried forward around teeth numbers 14, 13, 12, 11, 10, and 9 and then across the midline along the alveolar crest to tooth #6.  The periosteum was reflected.  The teeth were elevated.  In the area of tooth #16, bone was removed to the outer  cortical layer.  Cancellous bone was found, and there was no tooth #16 or roots present.  Apparently the radiograph was just hyperdense bone in this area.  The other teeth were elevated and removed with the dental forceps.  Then, the sockets were  curetted.  The periosteum was reflected, and alveoplasty was performed using an egg-shaped bur and bone file.  Then, the areas were irrigated and closed with 3-0 chromic.  The bite block and sweetheart retractor were repositioned to the other side of the  mouth.  A 15 blade was used to make an incision around teeth numbers 27, 28 and 29 with proximal and distal extensions on the alveolar crest and around teeth numbers 4, 5 and 6 with a proximal extension overlying the right tuberosity to expose #1.  The  periosteum was reflected from around these teeth.  Tooth #1 required removal of bone with a Stryker handpiece, and then the tooth was removed with the 301 elevator.  Teeth numbers 4, 5, and 6 were elevated with a 301 and then removed with the dental  forceps.  Then numbers 27, 28,  and 29 were elevated with a 301 elevator and removed with the dental forceps.  Then, the sockets were curetted, the periosteum was reflected, and alveoplasty was performed in the right maxilla and mandible using an  egg-shaped bur and bone file.  Then, the areas were irrigated and closed with 3-0 chromic.  The oral cavity was then irrigated and suctioned and the throat pack was removed.  The patient was awakened and taken to recovery room breathing spontaneously in  good condition.  ESTIMATED BLOOD LOSS:  Minimal.  COMPLICATIONS:  None.  SPECIMENS:   None.  LN/NUANCE  D:10/23/2018 T:10/23/2018 JOB:[id number]

## 2018-10-23 NOTE — Progress Notes (Addendum)
Per Pt, drank black coffee at 0500 DOS.  Informed Dr. Bradley Ferris, ok to proceed.

## 2018-10-23 NOTE — Anesthesia Postprocedure Evaluation (Signed)
Anesthesia Post Note  Patient: Frances Graham  Procedure(s) Performed: DENTAL RESTORATION/EXTRACTIONS (Bilateral )     Patient location during evaluation: PACU Anesthesia Type: General Level of consciousness: awake and alert Pain management: pain level controlled Vital Signs Assessment: post-procedure vital signs reviewed and stable Respiratory status: spontaneous breathing, nonlabored ventilation, respiratory function stable and patient connected to nasal cannula oxygen Cardiovascular status: blood pressure returned to baseline and stable Postop Assessment: no apparent nausea or vomiting Anesthetic complications: no    Last Vitals:  Vitals:   10/23/18 1237 10/23/18 1245  BP: 125/74   Pulse: 68   Resp: 15   Temp:  36.5 C  SpO2: 98%     Last Pain:  Vitals:   10/23/18 1230  TempSrc:   PainSc: 9                  Ryan P Ellender

## 2018-10-23 NOTE — H&P (Signed)
H&P documentation  -History and Physical Reviewed  -Patient has been re-examined  -No change in the plan of care  Yasmine Kilbourne  

## 2018-10-23 NOTE — Op Note (Signed)
10/23/2018  10:30 AM  PATIENT:  Frances Graham  53 y.o. female  PRE-OPERATIVE DIAGNOSIS:  NON RESTORABLE/IMPACTED TEETH # 1, 4, 5, 6, 9, 10, 11, 12, 13, 14, 16, 18, 20, 21, 22, 27, 28, 29 POST-OPERATIVE DIAGNOSIS:  SAME + NO TOOTH #16  PROCEDURE:  Procedure(s): EXTRACTIONS  TEETH # 1, 4, 5, 6, 9, 10, 11, 12, 13, 14, 18, 20, 21, 22, 27, 28, 29, ALVEOLOPALSTY RIGHT AND LEFT MAXILLA AND MANDIBLE   SURGEON:  Surgeon(s): Ocie Doyne, DDS  ANESTHESIA:   local and general  EBL:  minimal  DRAINS: none   SPECIMEN:  No Specimen  COUNTS:  YES  PLAN OF CARE: Discharge to home after PACU  PATIENT DISPOSITION:  PACU - hemodynamically stable.   PROCEDURE DETAILS: Dictation #[id number]  [name], DMD 10/23/2018 10:30 AM

## 2018-10-27 ENCOUNTER — Encounter (HOSPITAL_COMMUNITY): Payer: Self-pay | Admitting: Oral Surgery

## 2018-11-10 ENCOUNTER — Inpatient Hospital Stay (HOSPITAL_COMMUNITY)
Admission: EM | Admit: 2018-11-10 | Discharge: 2018-11-12 | DRG: 395 | Disposition: A | Discharge status: Home / Self Care | Payer: Medicaid Other | Attending: Student | Admitting: Student

## 2018-11-10 ENCOUNTER — Emergency Department (HOSPITAL_COMMUNITY): Payer: Medicaid Other

## 2018-11-10 ENCOUNTER — Encounter (HOSPITAL_COMMUNITY): Payer: Self-pay | Admitting: Emergency Medicine

## 2018-11-10 DIAGNOSIS — F17210 Nicotine dependence, cigarettes, uncomplicated: Secondary | ICD-10-CM | POA: Diagnosis present

## 2018-11-10 DIAGNOSIS — Z89411 Acquired absence of right great toe: Secondary | ICD-10-CM

## 2018-11-10 DIAGNOSIS — K5900 Constipation, unspecified: Secondary | ICD-10-CM | POA: Diagnosis present

## 2018-11-10 DIAGNOSIS — Z888 Allergy status to other drugs, medicaments and biological substances status: Secondary | ICD-10-CM

## 2018-11-10 DIAGNOSIS — Z79899 Other long term (current) drug therapy: Secondary | ICD-10-CM

## 2018-11-10 DIAGNOSIS — Z8249 Family history of ischemic heart disease and other diseases of the circulatory system: Secondary | ICD-10-CM

## 2018-11-10 DIAGNOSIS — I739 Peripheral vascular disease, unspecified: Secondary | ICD-10-CM | POA: Diagnosis present

## 2018-11-10 DIAGNOSIS — I10 Essential (primary) hypertension: Secondary | ICD-10-CM | POA: Diagnosis present

## 2018-11-10 DIAGNOSIS — Z825 Family history of asthma and other chronic lower respiratory diseases: Secondary | ICD-10-CM

## 2018-11-10 DIAGNOSIS — Z720 Tobacco use: Secondary | ICD-10-CM | POA: Diagnosis present

## 2018-11-10 DIAGNOSIS — Z87442 Personal history of urinary calculi: Secondary | ICD-10-CM

## 2018-11-10 DIAGNOSIS — F988 Other specified behavioral and emotional disorders with onset usually occurring in childhood and adolescence: Secondary | ICD-10-CM | POA: Diagnosis present

## 2018-11-10 DIAGNOSIS — Z95828 Presence of other vascular implants and grafts: Secondary | ICD-10-CM

## 2018-11-10 DIAGNOSIS — N7011 Chronic salpingitis: Secondary | ICD-10-CM

## 2018-11-10 DIAGNOSIS — Z881 Allergy status to other antibiotic agents status: Secondary | ICD-10-CM

## 2018-11-10 DIAGNOSIS — K56609 Unspecified intestinal obstruction, unspecified as to partial versus complete obstruction: Secondary | ICD-10-CM | POA: Diagnosis present

## 2018-11-10 DIAGNOSIS — Z833 Family history of diabetes mellitus: Secondary | ICD-10-CM

## 2018-11-10 DIAGNOSIS — Z886 Allergy status to analgesic agent status: Secondary | ICD-10-CM

## 2018-11-10 DIAGNOSIS — K436 Other and unspecified ventral hernia with obstruction, without gangrene: Secondary | ICD-10-CM

## 2018-11-10 DIAGNOSIS — Z885 Allergy status to narcotic agent status: Secondary | ICD-10-CM

## 2018-11-10 DIAGNOSIS — K430 Incisional hernia with obstruction, without gangrene: Principal | ICD-10-CM | POA: Diagnosis present

## 2018-11-10 DIAGNOSIS — Z880 Allergy status to penicillin: Secondary | ICD-10-CM

## 2018-11-10 DIAGNOSIS — D72829 Elevated white blood cell count, unspecified: Secondary | ICD-10-CM | POA: Diagnosis present

## 2018-11-10 LAB — CBC
HCT: 41.3 % (ref 36.0–46.0)
Hemoglobin: 13.4 g/dL (ref 12.0–15.0)
MCH: 32.1 pg (ref 26.0–34.0)
MCHC: 32.4 g/dL (ref 30.0–36.0)
MCV: 99 fL (ref 80.0–100.0)
Platelets: 202 10*3/uL (ref 150–400)
RBC: 4.17 MIL/uL (ref 3.87–5.11)
RDW: 12.8 % (ref 11.5–15.5)
WBC: 16.8 10*3/uL — ABNORMAL HIGH (ref 4.0–10.5)
nRBC: 0 % (ref 0.0–0.2)

## 2018-11-10 LAB — COMPREHENSIVE METABOLIC PANEL
ALT: 13 U/L (ref 0–44)
AST: 13 U/L — ABNORMAL LOW (ref 15–41)
Albumin: 4 g/dL (ref 3.5–5.0)
Alkaline Phosphatase: 69 U/L (ref 38–126)
Anion gap: 8 (ref 5–15)
BUN: 15 mg/dL (ref 6–20)
CALCIUM: 8.7 mg/dL — AB (ref 8.9–10.3)
CO2: 24 mmol/L (ref 22–32)
Chloride: 104 mmol/L (ref 98–111)
Creatinine, Ser: 0.69 mg/dL (ref 0.44–1.00)
GFR calc Af Amer: >60 mL/min (ref >60)
GFR calc non Af Amer: >60 mL/min (ref >60)
Glucose, Bld: 103 mg/dL — ABNORMAL HIGH (ref 70–99)
Potassium: 3.7 mmol/L (ref 3.5–5.1)
Sodium: 136 mmol/L (ref 135–145)
Total Bilirubin: 0.9 mg/dL (ref 0.3–1.2)
Total Protein: 7.1 g/dL (ref 6.5–8.1)

## 2018-11-10 LAB — URINALYSIS, ROUTINE W REFLEX MICROSCOPIC
Bacteria, UA: NONE SEEN
Bilirubin Urine: NEGATIVE
Glucose, UA: NEGATIVE mg/dL
Ketones, ur: 20 mg/dL — AB
Leukocytes,Ua: NEGATIVE
NITRITE: NEGATIVE
PROTEIN: NEGATIVE mg/dL
Specific Gravity, Urine: 1.035 — ABNORMAL HIGH (ref 1.005–1.030)
pH: 5 (ref 5.0–8.0)

## 2018-11-10 LAB — TYPE AND SCREEN
ABO/RH(D): A POS
Antibody Screen: NEGATIVE

## 2018-11-10 LAB — LACTIC ACID, PLASMA: Lactic Acid, Venous: 0.6 mmol/L (ref 0.5–1.9)

## 2018-11-10 LAB — PROTIME-INR
INR: 1.17
Prothrombin Time: 14.8 seconds (ref 11.4–15.2)

## 2018-11-10 LAB — WET PREP, GENITAL
Clue Cells Wet Prep HPF POC: NONE SEEN
SPERM: NONE SEEN
Trich, Wet Prep: NONE SEEN
WBC, Wet Prep HPF POC: NONE SEEN
Yeast Wet Prep HPF POC: NONE SEEN

## 2018-11-10 LAB — LIPASE, BLOOD: Lipase: 22 U/L (ref 11–51)

## 2018-11-10 LAB — I-STAT BETA HCG BLOOD, ED (MC, WL, AP ONLY): I-stat hCG, quantitative: <5 m[IU]/mL (ref <5)

## 2018-11-10 MED ORDER — SODIUM CHLORIDE 0.9% FLUSH: 3.0000 mL | Freq: Once | INTRAVENOUS | Status: DC

## 2018-11-10 MED ORDER — ONDANSETRON HCL 4 MG/2ML IJ SOLN
4.0000 mg | Freq: Once | INTRAMUSCULAR | Status: AC
  Administered 2018-11-10: 4 mg via INTRAVENOUS
  Filled 2018-11-10: qty 2

## 2018-11-10 MED ORDER — IOPAMIDOL (ISOVUE-300) INJECTION 61%
100.0000 mL | Freq: Once | INTRAVENOUS | Status: AC | PRN
  Administered 2018-11-10: 100 mL via INTRAVENOUS

## 2018-11-10 MED ORDER — HYDROMORPHONE HCL 1 MG/ML IJ SOLN
1.0000 mg | Freq: Once | INTRAMUSCULAR | Status: AC
  Administered 2018-11-10: 1 mg via INTRAVENOUS
  Filled 2018-11-10: qty 1

## 2018-11-10 MED ORDER — DIATRIZOATE MEGLUMINE & SODIUM 66-10 % PO SOLN
90.0000 mL | Freq: Once | ORAL | Status: AC
  Administered 2018-11-10: 90 mL via NASOGASTRIC
  Filled 2018-11-10: qty 90

## 2018-11-10 MED ORDER — MORPHINE SULFATE (PF) 2 MG/ML IV SOLN: 2.0000 mg | Freq: Once | INTRAVENOUS | Status: DC

## 2018-11-10 MED ORDER — CIPROFLOXACIN IN D5W 400 MG/200ML IV SOLN
400.0000 mg | Freq: Once | INTRAVENOUS | Status: AC
  Administered 2018-11-10: 400 mg via INTRAVENOUS
  Filled 2018-11-10: qty 200

## 2018-11-10 MED ORDER — SODIUM CHLORIDE 0.9 % IV BOLUS
1000.0000 mL | Freq: Once | INTRAVENOUS | Status: AC
  Administered 2018-11-10: 1000 mL via INTRAVENOUS

## 2018-11-10 MED ORDER — METRONIDAZOLE IN NACL 5-0.79 MG/ML-% IV SOLN
500.0000 mg | Freq: Once | INTRAVENOUS | Status: AC
  Administered 2018-11-10: 500 mg via INTRAVENOUS
  Filled 2018-11-10: qty 100

## 2018-11-10 MED ORDER — GENTAMICIN SULFATE 40 MG/ML IJ SOLN
500.0000 mg | Freq: Once | INTRAVENOUS | Status: AC
  Administered 2018-11-10: 500 mg via INTRAVENOUS
  Filled 2018-11-10: qty 12.5

## 2018-11-10 MED ORDER — SODIUM CHLORIDE (PF) 0.9 % IJ SOLN
INTRAMUSCULAR | Status: AC
  Filled 2018-11-10: qty 50

## 2018-11-10 MED ORDER — SODIUM CHLORIDE 0.9 % IV SOLN
Freq: Once | INTRAVENOUS | Status: AC
  Administered 2018-11-10: 22:00:00 via INTRAVENOUS

## 2018-11-10 MED ORDER — IOPAMIDOL (ISOVUE-300) INJECTION 61%
INTRAVENOUS | Status: AC
  Filled 2018-11-10: qty 100

## 2018-11-10 NOTE — Consult Note (Signed)
Reason for Consult:incarcerated hernia; abdominal pain  Referring Physician: Dr Chesley Noon is an 53 y.o. female.  HPI: 53 year old Caucasian female with history of atherosclerotic disease, peripheral artery disease, kidney stones and history of aortobifemoral bypass graft comes in with ongoing abdominal pain.  She states last Wednesday she developed flank pain and some lateral abdominal discomfort.  She states that she had hematuria.  She ended up seeing her primary care doctor on Friday-Dr. Windle Guard.  She states that they checked blood and urine.  She was subsequently called and stated that she had a UTI and they called in antibiotics which would be ready to this Thursday.  She states that she passed 2 stones.  She continued to have ongoing abdominal discomfort.  It is nonspecific it is in the upper and lower abdomen but more so in the lower abdomen now.  She has had nausea and subjective fever.  No vomiting.  Last bowel movement yesterday.  Had flatus in the emergency room.  Continues to smoke.  A pack last 1 week  No drugs or alcohol.  Works with horses.  No melena or hematochezia.  She had dental surgery at the end of January and got constipated afterwards but states that she got back to her normal bowel movement pattern.  She had a CT scan which had evidence concerning for dilated small bowel and dilated colon up to a herniated segment of transverse colon and an incisional hernia.  She had several other fat-containing incisional hernias in the midline.  She also had evidence of a left inflammatory hydrosalpinx.   Past Medical History:  Diagnosis Date  . Anxiety    panic attacks- has then post op  . Asthma    as a child  . Attention deficit disorder   . Essential hypertension    off  medications, has lost 69 lbs  . Headache   . History of kidney stones    passed  . PAD (peripheral artery disease) (HCC)    Infrarenal aortic occlusion  . Temporomandibular joint  disorder     Past Surgical History:  Procedure Laterality Date  . AMPUTATION Right 08/01/2016   Procedure: AMPUTATION GREAT TOE;  Surgeon: Chuck Hint, MD;  Location: [facility] OR;  Service: Vascular;  Laterality: Right;  . AMPUTATION TOE Right 08/01/2016  . AORTA - BILATERAL FEMORAL ARTERY BYPASS GRAFT Bilateral 06/28/2016   Procedure: AORTOBIFEMORAL BYPASS GRAFT;  Surgeon: Chuck Hint, MD;  Location: [facility] OR;  Service: Vascular;  Laterality: Bilateral;  . BIOPSY THYROID    . LEG SURGERY Left 1970   pin  to fix cogental hip  . MENISCUS REPAIR Left    2015  . TOOTH EXTRACTION Bilateral 10/23/2018   Procedure: DENTAL RESTORATION/EXTRACTIONS;  Surgeon: Ocie Doyne, DDS;  Location: [facility] OR;  Service: Oral Surgery;  Laterality: Bilateral;    Family History  Problem Relation Age of Onset  . COPD Mother   . CAD Mother   . CAD Maternal Grandmother   . Diabetes Maternal Uncle     Social History:  reports that she has been smoking cigarettes. She has a 5.00 pack-year smoking history. She has never used smokeless tobacco. She reports that she does not drink alcohol or use drugs.  Allergies:  Allergies  Allergen Reactions  . Bee Venom Anaphylaxis  . Penicillins Anaphylaxis    Has patient had a PCN reaction causing immediate rash, facial/tongue/throat swelling, SOB or lightheadedness with hypotension: Yes Has patient had a PCN reaction  causing severe rash involving mucus membranes or skin necrosis: No Has patient had a PCN reaction that required hospitalization Yes Has patient had a PCN reaction occurring within the last 10 years: No If all of the above answers are "NO", then may proceed with Cephalosporin use.   . Benadryl [Diphenhydramine Hcl] Other (See Comments)    syncope  . Celebrex [Celecoxib] Nausea And Vomiting and Rash  . Keflex [Cephalexin] Nausea And Vomiting, Rash and Other (See Comments)    Projectile vomiting, thrush, fever blisters, ED visit  . Lidocaine  Swelling and Rash    Foot swelled up like a balloon  . Codeine Other (See Comments)    Reflux. Pt states not allergic to hydrocodone and oxycodone .    Medications: I have reviewed the patient's current medications.  Results for orders placed or performed during the hospital encounter of 11/10/18 (from the past 48 hour(s))  Lipase, blood     Status: None   Collection Time: 11/10/18  2:03 PM  Result Value Ref Range   Lipase 22 11 - 51 U/L    Comment: Performed at [facility], [address], [place], [place] [address]  Comprehensive metabolic panel     Status: Abnormal   Collection Time: 11/10/18  2:03 PM  Result Value Ref Range   Sodium 136 135 - 145 mmol/L   Potassium 3.7 3.5 - 5.1 mmol/L   Chloride 104 98 - 111 mmol/L   CO2 24 22 - 32 mmol/L   Glucose, Bld 103 (H) 70 - 99 mg/dL   BUN 15 6 - 20 mg/dL   Creatinine, Ser 2.53 0.44 - 1.00 mg/dL   Calcium 8.7 (L) 8.9 - 10.3 mg/dL   Total Protein 7.1 6.5 - 8.1 g/dL   Albumin 4.0 3.5 - 5.0 g/dL   AST 13 (L) 15 - 41 U/L   ALT 13 0 - 44 U/L   Alkaline Phosphatase 69 38 - 126 U/L   Total Bilirubin 0.9 0.3 - 1.2 mg/dL   GFR calc non Af Amer >60 >60 mL/min   GFR calc Af Amer >60 >60 mL/min   Anion gap 8 5 - 15    Comment: Performed at [facility], [address] [place], [place] [address]  CBC     Status: Abnormal   Collection Time: 11/10/18  2:03 PM  Result Value Ref Range   WBC 16.8 (H) 4.0 - 10.5 K/uL   RBC 4.17 3.87 - 5.11 MIL/uL   Hemoglobin 13.4 12.0 - 15.0 g/dL   HCT 34.7 42.5 - 95.6 %   MCV 99.0 80.0 - 100.0 fL   MCH 32.1 26.0 - 34.0 pg   MCHC 32.4 30.0 - 36.0 g/dL   RDW 38.7 56.4 - 33.2 %   Platelets 202 150 - 400 K/uL   nRBC 0.0 0.0 - 0.2 %    Comment: Performed at [facility], [address] [place], [place] [address]  I-Stat beta hCG blood, ED     Status: None   Collection Time: 11/10/18  2:08 PM  Result Value Ref Range   I-stat hCG, quantitative <5.0  <5 mIU/mL   Comment 3            Comment:   GEST. AGE      CONC.  (mIU/mL)   <=1 WEEK        5 - 50     2 WEEKS       50 - 500  3 WEEKS       100 - 10,000     4 WEEKS     1,000 - 30,000        FEMALE AND NON-PREGNANT FEMALE:     LESS THAN 5 mIU/mL   Urinalysis, Routine w reflex microscopic     Status: Abnormal   Collection Time: 11/10/18  8:20 PM  Result Value Ref Range   Color, Urine YELLOW YELLOW   APPearance CLEAR CLEAR   Specific Gravity, Urine 1.035 (H) 1.005 - 1.030   pH 5.0 5.0 - 8.0   Glucose, UA NEGATIVE NEGATIVE mg/dL   Hgb urine dipstick SMALL (A) NEGATIVE   Bilirubin Urine NEGATIVE NEGATIVE   Ketones, ur 20 (A) NEGATIVE mg/dL   Protein, ur NEGATIVE NEGATIVE mg/dL   Nitrite NEGATIVE NEGATIVE   Leukocytes,Ua NEGATIVE NEGATIVE   RBC / HPF 0-5 0 - 5 RBC/hpf   WBC, UA 0-5 0 - 5 WBC/hpf   Bacteria, UA NONE SEEN NONE SEEN   Mucus PRESENT     Comment: Performed at [facility], [address] [place], [place] [address]  Protime-INR     Status: None   Collection Time: 11/10/18  8:30 PM  Result Value Ref Range   Prothrombin Time 14.8 11.4 - 15.2 seconds   INR 1.17     Comment: Performed at [facility], [address] [place], [place] [address]  Lactic acid, plasma     Status: None   Collection Time: 11/10/18  8:30 PM  Result Value Ref Range   Lactic Acid, Venous 0.6 0.5 - 1.9 mmol/L    Comment: Performed at [facility], [address] [place], [place] [address]    Dg Abdomen 1 View  Result Date: 11/10/2018 CLINICAL DATA:  53 year old female with bowel obstruction due to herniated transverse colon, NG tube placement. EXAM: ABDOMEN - 1 VIEW COMPARISON:  CT Abdomen and Pelvis earlier today. FINDINGS: Portable AP supine view at 2003 hours. Enteric tube placed with side hole at the level of the gastric body. Stable visible bowel gas pattern. Negative lung bases and mediastinal contours. IMPRESSION: Enteric tube  placed into the stomach, side hole the level of the gastric body. Electronically Signed   By: Odessa FlemingH  Hall M.D.   On: 11/10/2018 20:43   Ct Abdomen Pelvis W Contrast  Result Date: 11/10/2018 CLINICAL DATA:  53 year old female with abdominal pain, back pain, nausea, gross hematuria, urinary frequency, fever. EXAM: CT ABDOMEN AND PELVIS WITH CONTRAST TECHNIQUE: Multidetector CT imaging of the abdomen and pelvis was performed using the standard protocol following bolus administration of intravenous contrast. CONTRAST:  100mL ISOVUE-300 IOPAMIDOL (ISOVUE-300) INJECTION 61% COMPARISON:  CT Abdomen and Pelvis 07/13/2016 end earlier. FINDINGS: Lower chest: Negative. Hepatobiliary: 15 millimeter gallstone in the gallbladder neck. No pericholecystic inflammation. No bile duct enlargement. Negative liver. Pancreas: Negative. Spleen: Negative. Adrenals/Urinary Tract: Normal adrenal glands. Bilateral renal enhancement and contrast excretion is symmetric and normal. Decompressed proximal ureters. Diminutive and unremarkable urinary bladder. Chronic pelvic phleboliths. Stomach/Bowel: There is dilated small bowel throughout the abdomen to the terminal ileum. The right colon is likewise dilated with fluid to the level of the mid transverse where a 6-8 centimeter segment of the transverse colon is herniated ventrally on series 2, image 40. There are superimposed small caudal fat containing hernias between the herniated transverse colon and the umbilicus. And there is a single small fat herniation cephalad to the bowel hernia. See sagittal image 99. Downstream of this  the large bowel is normal and mostly decompressed. The stomach, duodenum and proximal jejunum remain normal. No free air. No free fluid identified. The appendix remains within normal limits on coronal image 50. Vascular/Lymphatic: Calcified aortic atherosclerosis. Sequelae of aortic bypass. The bypass appears patent. Portal venous system is patent. No lymphadenopathy.  Reproductive: Ectatic tubular opacity associated with the left adnexa measuring up to 11 millimeters diameter is new since 2017 on series 2, image 77. The uterus and right ovary remain within normal limits. Other: No pelvic free fluid. Stable bilateral inguinal surgical clips. Musculoskeletal: No acute osseous abnormality identified. IMPRESSION: 1. 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 Subsequent moderate to high-grade large and small-bowel obstruction proximally. Multiple small fat containing hernias in the midline both cephalad and caudal to the bowel hernia. 2. New fluid-filled tubular structure at the left ovary since 2017 suspicious for inflammatory Hydrosalpinx. 3. Cholelithiasis without CT evidence of acute cholecystitis. 4.  Aortic Atherosclerosis (ICD10-I70.0).  Patent aortic bypass. Electronically Signed   By: Odessa Fleming M.D.   On: 11/10/2018 19:17    Review of Systems  Constitutional: Positive for fever (subjective). Negative for weight loss.  HENT: Negative for nosebleeds.   Eyes: Negative for blurred vision.  Respiratory: Negative for shortness of breath.   Cardiovascular: Negative for chest pain, palpitations, orthopnea and PND.       Denies DOE; +PAD s/p Rt great toe amp  Gastrointestinal: Positive for abdominal pain and nausea. Negative for blood in stool, constipation, diarrhea and melena.  Genitourinary: Positive for flank pain and hematuria. Negative for dysuria.  Skin: Negative for itching and rash.  Neurological: Negative for dizziness, focal weakness, seizures, loss of consciousness and headaches.       Denies TIAs, amaurosis fugax  Endo/Heme/Allergies: Does not bruise/bleed easily.  Psychiatric/Behavioral: The patient is not nervous/anxious.    Blood pressure 99/60, pulse 100, temperature 98.8 F (37.1 C), temperature source Oral, resp. rate (!) 22, height 5' 7.5" (1.715 m), weight 81.6 kg, last menstrual period 10/14/2018, SpO2  94 %. Physical Exam  Vitals reviewed. Constitutional: She is oriented to person, place, and time. Vital signs are normal. She appears well-developed and well-nourished. She appears ill (slightly ill). No distress.  HENT:  Head: Normocephalic and atraumatic.  Right Ear: External ear normal.  Left Ear: External ear normal.  Eyes: Conjunctivae are normal. No scleral icterus.  Neck: Normal range of motion. Neck supple. No tracheal deviation present. No thyromegaly present.  Cardiovascular: Normal rate and normal heart sounds.  Respiratory: Effort normal and breath sounds normal. No stridor. No respiratory distress. She has no wheezes.  GI: There is abdominal tenderness. There is no rigidity, no rebound and no guarding. A hernia is present. Hernia confirmed positive in the ventral area.    Old midline incision; several palp fascial defects. Upper midline hernia soft, reducible. No skin changes. TTP throughout abd, more tender in b/l lower abds; NG in with minimal output. NG flushed easily.  I can reduce the upper midline hernia  Musculoskeletal:        General: No tenderness or edema.  Lymphadenopathy:    She has no cervical adenopathy.  Neurological: She is alert and oriented to person, place, and time. She exhibits normal muscle tone.  Skin: Skin is warm and dry. No rash noted. She is not diaphoretic. No erythema. No pallor.  Psychiatric: She has a normal mood and affect. Her behavior is normal. Judgment and thought content normal.    Assessment/Plan:  Abdominal pain Incisional ventral hernias Possible bowel obstruction Left inflammatory hydrosalpinx Recent UTI Peripheral arterial disease Tobacco use  I reviewed her CT scan and discussed it with the reading radiologist.  There is no bowel wall thickening, pneumatosis or fluid around the herniated colon.  On exam the upper midline is soft.  I can reduce the hernia.  She is having flatus as of today.  She has normal lactate.  I do not  think she needs emergent surgery for incisional hernias  I do not think her leukocytosis is coming from the hernia/bowel obstruction.  It could reflect mild dehydration versus the inflammatory process in the left pelvis  We will start small bowel obstruction protocol Recommend IV antibiotics not for the obstruction/hernia but rather for the left pelvic hydrosalpinx Will need outpatient follow-up with GYN regarding above  Mary SellaEric M. Andrey CampanileWilson, MD, FACS General, Bariatric, & Minimally Invasive Surgery [facility]  Gaynelle Aduric Shondrika Hoque 11/10/2018, 10:01 PM

## 2018-11-10 NOTE — ED Provider Notes (Signed)
[facility]-EMERGENCY DEPT Provider Note   CSN: [id number] Arrival date & time: 11/10/18  1337    History   Chief Complaint Chief Complaint  Patient presents with  . Abdominal Pain  . Back Pain    HPI Frances Graham is a 53 y.o. female.  She is here with generalized abdominal pain and back pain that is been going on for 2 or 3 days getting worse.  She said she was with her PCP at the end of last week with urinary symptoms and was treated for UTI.  She has a history of kidney stones that she is passed a couple a kidney stone since then.  Over the past 2 or 3 days her pain is generally getting more and more severe now is unbearable.  She thinks she might of had a fever.  She has had nausea vomiting.  Feels a little constipated.  She is having some dysuria and hematuria.  No numbness or weakness.  No cough or shortness of breath.  No chest pain.     The history is provided by the patient.  Abdominal Pain  Pain location:  Generalized Pain quality: aching   Pain radiates to:  Back Pain severity:  Severe Onset quality:  Gradual Timing:  Constant Progression:  Worsening Chronicity:  New Context: previous surgery and recent illness   Context: not sick contacts, not suspicious food intake and not trauma   Relieved by:  None tried Worsened by:  Nothing Ineffective treatments:  None tried Associated symptoms: constipation, fever, hematuria, nausea and vomiting   Associated symptoms: no chest pain, no cough, no diarrhea, no hematemesis, no hematochezia, no melena, no shortness of breath and no sore throat   Back Pain  Associated symptoms: abdominal pain and fever   Associated symptoms: no chest pain and no headaches     Past Medical History:  Diagnosis Date  . Anxiety    panic attacks- has then post op  . Asthma    as a child  . Attention deficit disorder   . Essential hypertension    off  medications, has lost 69 lbs  . Headache   . History of kidney  stones    passed  . PAD (peripheral artery disease) (HCC)    Infrarenal aortic occlusion  . Temporomandibular joint disorder     Patient Active Problem List   Diagnosis Date Noted  . Gangrene of toe of right foot (HCC) 08/01/2016  . Pre-operative clearance   . Mixed hyperlipidemia   . Peripheral vascular disease (HCC) 06/23/2016  . Aortoiliac occlusive disease (HCC) 06/23/2016  . Acute kidney injury (HCC) 06/23/2016  . Essential hypertension 06/23/2016  . Ischemic ulcer of foot (HCC) 06/23/2016  . Cardiovascular risk factor 06/23/2016  . Leukocytosis 06/23/2016  . Tobacco abuse 06/23/2016  . Chronic low back pain 06/23/2016  . ADD (attention deficit disorder) 06/23/2016    Past Surgical History:  Procedure Laterality Date  . AMPUTATION Right 08/01/2016   Procedure: AMPUTATION GREAT TOE;  Surgeon: Chuck Hint, MD;  Location: [facility] OR;  Service: Vascular;  Laterality: Right;  . AMPUTATION TOE Right 08/01/2016  . AORTA - BILATERAL FEMORAL ARTERY BYPASS GRAFT Bilateral 06/28/2016   Procedure: AORTOBIFEMORAL BYPASS GRAFT;  Surgeon: Chuck Hint, MD;  Location: [facility] OR;  Service: Vascular;  Laterality: Bilateral;  . BIOPSY THYROID    . LEG SURGERY Left 1970   pin  to fix cogental hip  . MENISCUS REPAIR Left    2015  .  TOOTH EXTRACTION Bilateral 10/23/2018   Procedure: DENTAL RESTORATION/EXTRACTIONS;  Surgeon: Ocie Doyne, DDS;  Location: [facility] OR;  Service: Oral Surgery;  Laterality: Bilateral;     OB History   No obstetric history on file.      Home Medications    Prior to Admission medications   Medication Sig Start Date End Date Taking? Authorizing Provider  clindamycin (CLEOCIN) 300 MG capsule Take 1 capsule (300 mg total) by mouth 3 (three) times daily. 10/23/18   Ocie Doyne, DDS  ibuprofen (ADVIL,MOTRIN) 200 MG tablet Take 600-800 mg by mouth every 8 (eight) hours as needed (pain.).    [provider]  methylphenidate (RITALIN) 20 MG tablet  Take 20 mg by mouth See admin instructions. Take 1 tablet (20 mg) by mouth up to 3 times daily for focus (DOES NOT TAKE ON WEEKENDS)    [provider]  oxyCODONE-acetaminophen (PERCOCET) 5-325 MG tablet Take 1 tablet by mouth every 4 (four) hours as needed. 10/23/18   Ocie Doyne, DDS    Family History Family History  Problem Relation Age of Onset  . COPD Mother   . CAD Mother   . CAD Maternal Grandmother   . Diabetes Maternal Uncle     Social History Social History   Tobacco Use  . Smoking status: Current Every Day Smoker    Packs/day: 0.50    Years: 10.00    Pack years: 5.00    Types: Cigarettes  . Smokeless tobacco: Never Used  Substance Use Topics  . Alcohol use: No  . Drug use: No     Allergies   Bee venom; Penicillins; Benadryl [diphenhydramine hcl]; Celebrex [celecoxib]; Keflex [cephalexin]; Lidocaine; and Codeine   Review of Systems Review of Systems  Constitutional: Positive for fever.  HENT: Negative for sore throat.   Eyes: Negative for visual disturbance.  Respiratory: Negative for cough and shortness of breath.   Cardiovascular: Negative for chest pain.  Gastrointestinal: Positive for abdominal pain, constipation, nausea and vomiting. Negative for diarrhea, hematemesis, hematochezia and melena.  Genitourinary: Positive for hematuria.  Musculoskeletal: Positive for back pain.  Skin: Negative for rash.  Neurological: Negative for headaches.     Physical Exam Updated Vital Signs BP 127/84 (BP Location: Left Arm)   Pulse 100   Temp 98.8 F (37.1 C) (Oral)   Resp 15   Ht 5' 7.5" (1.715 m)   Wt 81.6 kg   LMP 10/14/2018   SpO2 100%   BMI 27.78 kg/m   Physical Exam Vitals signs and nursing note reviewed.  Constitutional:      General: She is not in acute distress.    Appearance: She is well-developed.  HENT:     Head: Normocephalic and atraumatic.  Eyes:     Conjunctiva/sclera: Conjunctivae normal.  Neck:     Musculoskeletal:  Neck supple.  Cardiovascular:     Rate and Rhythm: Normal rate and regular rhythm.     Pulses: Normal pulses.     Heart sounds: Normal heart sounds. No murmur.  Pulmonary:     Effort: Pulmonary effort is normal. No respiratory distress.     Breath sounds: Normal breath sounds.  Abdominal:     Palpations: Abdomen is soft.     Tenderness: There is generalized abdominal tenderness. There is no guarding or rebound.  Musculoskeletal:        General: No tenderness, deformity or signs of injury.     Right lower leg: No edema.     Left lower leg: No  edema.  Skin:    General: Skin is warm and dry.     Capillary Refill: Capillary refill takes less than 2 seconds.  Neurological:     General: No focal deficit present.     Mental Status: She is alert and oriented to person, place, and time.     Sensory: No sensory deficit.     Motor: No weakness.      ED Treatments / Results  Labs (all labs ordered are listed, but only abnormal results are displayed) Labs Reviewed  COMPREHENSIVE METABOLIC PANEL - Abnormal; Notable for the following components:      Result Value   Glucose, Bld 103 (*)    Calcium 8.7 (*)    AST 13 (*)    All other components within normal limits  CBC - Abnormal; Notable for the following components:   WBC 16.8 (*)    All other components within normal limits  URINALYSIS, ROUTINE W REFLEX MICROSCOPIC - Abnormal; Notable for the following components:   Specific Gravity, Urine 1.035 (*)    Hgb urine dipstick SMALL (*)    Ketones, ur 20 (*)    All other components within normal limits  WET PREP, GENITAL  URINE CULTURE  LIPASE, BLOOD  PROTIME-INR  LACTIC ACID, PLASMA  LACTIC ACID, PLASMA  I-STAT BETA HCG BLOOD, ED ([facility], [facility], [facility] ONLY)  TYPE AND SCREEN  ABO/RH  GC/CHLAMYDIA PROBE AMP ([place]) NOT AT [facility]    EKG None  Radiology Dg Abdomen 1 View  Result Date: 11/10/2018 CLINICAL DATA:  53 year old female with bowel obstruction due to herniated transverse  colon, NG tube placement. EXAM: ABDOMEN - 1 VIEW COMPARISON:  CT Abdomen and Pelvis earlier today. FINDINGS: Portable AP supine view at 2003 hours. Enteric tube placed with side hole at the level of the gastric body. Stable visible bowel gas pattern. Negative lung bases and mediastinal contours. IMPRESSION: Enteric tube placed into the stomach, side hole the level of the gastric body. Electronically Signed   By: Odessa FlemingH  Hall M.D.   On: 11/10/2018 20:43   Ct Abdomen Pelvis W Contrast  Result Date: 11/10/2018 CLINICAL DATA:  53 year old female with abdominal pain, back pain, nausea, gross hematuria, urinary frequency, fever. EXAM: CT ABDOMEN AND PELVIS WITH CONTRAST TECHNIQUE: Multidetector CT imaging of the abdomen and pelvis was performed using the standard protocol following bolus administration of intravenous contrast. CONTRAST:  100mL ISOVUE-300 IOPAMIDOL (ISOVUE-300) INJECTION 61% COMPARISON:  CT Abdomen and Pelvis 07/13/2016 end earlier. FINDINGS: Lower chest: Negative. Hepatobiliary: 15 millimeter gallstone in the gallbladder neck. No pericholecystic inflammation. No bile duct enlargement. Negative liver. Pancreas: Negative. Spleen: Negative. Adrenals/Urinary Tract: Normal adrenal glands. Bilateral renal enhancement and contrast excretion is symmetric and normal. Decompressed proximal ureters. Diminutive and unremarkable urinary bladder. Chronic pelvic phleboliths. Stomach/Bowel: There is dilated small bowel throughout the abdomen to the terminal ileum. The right colon is likewise dilated with fluid to the level of the mid transverse where a 6-8 centimeter segment of the transverse colon is herniated ventrally on series 2, image 40. There are superimposed small caudal fat containing hernias between the herniated transverse colon and the umbilicus. And there is a single small fat herniation cephalad to the bowel hernia. See sagittal image 99. Downstream of this the large bowel is normal and mostly decompressed.  The stomach, duodenum and proximal jejunum remain normal. No free air. No free fluid identified. The appendix remains within normal limits on coronal image 50. Vascular/Lymphatic: Calcified aortic atherosclerosis. Sequelae of aortic bypass.  The bypass appears patent. Portal venous system is patent. No lymphadenopathy. Reproductive: Ectatic tubular opacity associated with the left adnexa measuring up to 11 millimeters diameter is new since 2017 on series 2, image 77. The uterus and right ovary remain within normal limits. Other: No pelvic free fluid. Stable bilateral inguinal surgical clips. Musculoskeletal: No acute osseous abnormality identified. IMPRESSION: 1. 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 Subsequent moderate to high-grade large and small-bowel obstruction proximally. Multiple small fat containing hernias in the midline both cephalad and caudal to the bowel hernia. 2. New fluid-filled tubular structure at the left ovary since 2017 suspicious for inflammatory Hydrosalpinx. 3. Cholelithiasis without CT evidence of acute cholecystitis. 4.  Aortic Atherosclerosis (ICD10-I70.0).  Patent aortic bypass. Electronically Signed   By: Odessa FlemingH  Hall M.D.   On: 11/10/2018 19:17    Procedures Procedures (including critical care time)  Medications Ordered in ED Medications  sodium chloride flush (NS) 0.9 % injection 3 mL (has no administration in time range)  sodium chloride 0.9 % bolus 1,000 mL (has no administration in time range)  HYDROmorphone (DILAUDID) injection 1 mg (has no administration in time range)  ondansetron (ZOFRAN) injection 4 mg (has no administration in time range)     Initial Impression / Assessment and Plan / ED Course  I have reviewed the triage vital signs and the nursing notes.  Pertinent labs & imaging results that were available during my care of the patient were reviewed by me and considered in my medical decision making (see chart  for details).  Clinical Course as of Nov 10 2320  Tue Nov 10, 2018  1924 This on the CT report of ventral hernia incarceration with proximal bowel obstruction.  Palpating the abdomen I think I feel a fascial defect inferior to the umbilicus.  She had too much pain for me to reduce and have placed a call into general surgery.   [MB]  [id number] Discussed with Dr. Andrey CampanileWilson from surgery who asked for the patient to have an NG placed and to get a serum lactate.  He will evaluate the patient in the department.   [MB]  2115 Patient continues to have moderate to severe pain.  Have ordered her more Dilaudid and some maintenance fluids.   [MB]  2210 Patient seen by Dr. Andrey CampanileWilson.  He is not certain that all of her symptoms are secondary to to hernia and bowel obstruction.  Put some small bowel orders in but he feels the patient should be admitted to a medical service.   [MB]  2251 Discussed with Dr. Imogene Burnhen from the hospitalist service who will review this and talk with general surgery regarding which service she should be admitted to.   [MB]  2251 Pelvic exam done with nurse.  Chaperone.  Normal external genitalia.  She had a small amount of blood in the vault.  She said she is still menstruating and has irregular periods.  There is no particular cervical motion tenderness or discharge noted.  She has some mild adnexal tenderness but no appreciable mass noted.   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  

## 2018-11-10 NOTE — Progress Notes (Signed)
A consult was received from an ED physician for gentamicin per pharmacy dosing.  The patient's profile has been reviewed for ht/wt/allergies/indication/available labs.   A one time order has been placed for gentamicin 500 mg.    Further antibiotics/pharmacy consults should be ordered by admitting physician if indicated.                       Thank you, Herby Abraham, Pharm.D [phone] 11/10/2018 8:19 PM

## 2018-11-10 NOTE — ED Triage Notes (Signed)
Patient here from home with complaints of lower abd pain radiating into back. Saw PCP early this week diagnosed with kidney stone and UTI, pain increased in past 24 hours.

## 2018-11-10 NOTE — ED Notes (Signed)
NG tube clamped.

## 2018-11-10 NOTE — ED Notes (Signed)
Pt's sats decreases to 89% RA when she is asleep.  Placed pt on 2L O2 Fred[place]

## 2018-11-10 NOTE — ED Notes (Signed)
Family at bedside. 

## 2018-11-10 NOTE — ED Notes (Signed)
Wilson MD [facility] is at bedside to assess pt

## 2018-11-11 ENCOUNTER — Inpatient Hospital Stay (HOSPITAL_COMMUNITY): Payer: Medicaid Other

## 2018-11-11 ENCOUNTER — Other Ambulatory Visit: Payer: Self-pay

## 2018-11-11 DIAGNOSIS — K430 Incisional hernia with obstruction, without gangrene: Secondary | ICD-10-CM | POA: Diagnosis present

## 2018-11-11 DIAGNOSIS — Z833 Family history of diabetes mellitus: Secondary | ICD-10-CM | POA: Diagnosis not present

## 2018-11-11 DIAGNOSIS — D72829 Elevated white blood cell count, unspecified: Secondary | ICD-10-CM

## 2018-11-11 DIAGNOSIS — Z881 Allergy status to other antibiotic agents status: Secondary | ICD-10-CM | POA: Diagnosis not present

## 2018-11-11 DIAGNOSIS — Z825 Family history of asthma and other chronic lower respiratory diseases: Secondary | ICD-10-CM | POA: Diagnosis not present

## 2018-11-11 DIAGNOSIS — F17210 Nicotine dependence, cigarettes, uncomplicated: Secondary | ICD-10-CM | POA: Diagnosis present

## 2018-11-11 DIAGNOSIS — Z888 Allergy status to other drugs, medicaments and biological substances status: Secondary | ICD-10-CM | POA: Diagnosis not present

## 2018-11-11 DIAGNOSIS — R1084 Generalized abdominal pain: Secondary | ICD-10-CM | POA: Diagnosis present

## 2018-11-11 DIAGNOSIS — Z95828 Presence of other vascular implants and grafts: Secondary | ICD-10-CM | POA: Diagnosis not present

## 2018-11-11 DIAGNOSIS — Z87442 Personal history of urinary calculi: Secondary | ICD-10-CM | POA: Diagnosis not present

## 2018-11-11 DIAGNOSIS — Z880 Allergy status to penicillin: Secondary | ICD-10-CM | POA: Diagnosis not present

## 2018-11-11 DIAGNOSIS — Z886 Allergy status to analgesic agent status: Secondary | ICD-10-CM | POA: Diagnosis not present

## 2018-11-11 DIAGNOSIS — F988 Other specified behavioral and emotional disorders with onset usually occurring in childhood and adolescence: Secondary | ICD-10-CM | POA: Diagnosis present

## 2018-11-11 DIAGNOSIS — I10 Essential (primary) hypertension: Secondary | ICD-10-CM | POA: Diagnosis present

## 2018-11-11 DIAGNOSIS — K56609 Unspecified intestinal obstruction, unspecified as to partial versus complete obstruction: Secondary | ICD-10-CM | POA: Diagnosis not present

## 2018-11-11 DIAGNOSIS — Z8249 Family history of ischemic heart disease and other diseases of the circulatory system: Secondary | ICD-10-CM | POA: Diagnosis not present

## 2018-11-11 DIAGNOSIS — Z89411 Acquired absence of right great toe: Secondary | ICD-10-CM | POA: Diagnosis not present

## 2018-11-11 DIAGNOSIS — K5900 Constipation, unspecified: Secondary | ICD-10-CM | POA: Diagnosis present

## 2018-11-11 DIAGNOSIS — Z720 Tobacco use: Secondary | ICD-10-CM | POA: Diagnosis not present

## 2018-11-11 DIAGNOSIS — N7011 Chronic salpingitis: Secondary | ICD-10-CM | POA: Diagnosis present

## 2018-11-11 DIAGNOSIS — I739 Peripheral vascular disease, unspecified: Secondary | ICD-10-CM | POA: Diagnosis present

## 2018-11-11 DIAGNOSIS — Z79899 Other long term (current) drug therapy: Secondary | ICD-10-CM | POA: Diagnosis not present

## 2018-11-11 DIAGNOSIS — Z885 Allergy status to narcotic agent status: Secondary | ICD-10-CM | POA: Diagnosis not present

## 2018-11-11 LAB — COMPREHENSIVE METABOLIC PANEL
ALK PHOS: 58 U/L (ref 38–126)
ALT: 10 U/L (ref 0–44)
AST: 11 U/L — ABNORMAL LOW (ref 15–41)
Albumin: 3 g/dL — ABNORMAL LOW (ref 3.5–5.0)
Anion gap: 6 (ref 5–15)
BUN: 12 mg/dL (ref 6–20)
CALCIUM: 7.8 mg/dL — AB (ref 8.9–10.3)
CO2: 22 mmol/L (ref 22–32)
Chloride: 110 mmol/L (ref 98–111)
Creatinine, Ser: 0.72 mg/dL (ref 0.44–1.00)
GFR calc Af Amer: >60 mL/min (ref >60)
GFR calc non Af Amer: >60 mL/min (ref >60)
Glucose, Bld: 91 mg/dL (ref 70–99)
Potassium: 4.3 mmol/L (ref 3.5–5.1)
Sodium: 138 mmol/L (ref 135–145)
TOTAL PROTEIN: 5.7 g/dL — AB (ref 6.5–8.1)
Total Bilirubin: 1.2 mg/dL (ref 0.3–1.2)

## 2018-11-11 LAB — CBC WITH DIFFERENTIAL/PLATELET
ABS IMMATURE GRANULOCYTES: 0.06 10*3/uL (ref 0.00–0.07)
Basophils Absolute: 0 10*3/uL (ref 0.0–0.1)
Basophils Relative: 0 %
Eosinophils Absolute: 0.2 10*3/uL (ref 0.0–0.5)
Eosinophils Relative: 1 %
HCT: 35.8 % — ABNORMAL LOW (ref 36.0–46.0)
HEMOGLOBIN: 11.3 g/dL — AB (ref 12.0–15.0)
Immature Granulocytes: 1 %
Lymphocytes Relative: 14 %
Lymphs Abs: 1.8 10*3/uL (ref 0.7–4.0)
MCH: 31.7 pg (ref 26.0–34.0)
MCHC: 31.6 g/dL (ref 30.0–36.0)
MCV: 100.3 fL — AB (ref 80.0–100.0)
Monocytes Absolute: 1.2 10*3/uL — ABNORMAL HIGH (ref 0.1–1.0)
Monocytes Relative: 10 %
Neutro Abs: 9.3 10*3/uL — ABNORMAL HIGH (ref 1.7–7.7)
Neutrophils Relative %: 74 %
Platelets: 182 10*3/uL (ref 150–400)
RBC: 3.57 MIL/uL — ABNORMAL LOW (ref 3.87–5.11)
RDW: 12.7 % (ref 11.5–15.5)
WBC: 12.5 10*3/uL — ABNORMAL HIGH (ref 4.0–10.5)
nRBC: 0 % (ref 0.0–0.2)

## 2018-11-11 LAB — ABO/RH: ABO/RH(D): A POS

## 2018-11-11 LAB — HIV ANTIBODY (ROUTINE TESTING W REFLEX): HIV Screen 4th Generation wRfx: NONREACTIVE

## 2018-11-11 MED ORDER — HYDROMORPHONE HCL 1 MG/ML IJ SOLN
1.0000 mg | INTRAMUSCULAR | Status: DC | PRN
  Administered 2018-11-11 – 2018-11-12 (×11): 1 mg via INTRAVENOUS
  Filled 2018-11-11 (×11): qty 1

## 2018-11-11 MED ORDER — SODIUM CHLORIDE 0.9% FLUSH
3.0000 mL | Freq: Two times a day (BID) | INTRAVENOUS | Status: DC
  Administered 2018-11-11 (×2): 3 mL via INTRAVENOUS

## 2018-11-11 MED ORDER — METRONIDAZOLE IN NACL 5-0.79 MG/ML-% IV SOLN
500.0000 mg | Freq: Three times a day (TID) | INTRAVENOUS | Status: DC
  Administered 2018-11-11 (×2): 500 mg via INTRAVENOUS
  Filled 2018-11-11 (×2): qty 100

## 2018-11-11 MED ORDER — SODIUM CHLORIDE 0.9 % IV SOLN
INTRAVENOUS | Status: DC
  Administered 2018-11-11 – 2018-11-12 (×5): via INTRAVENOUS

## 2018-11-11 MED ORDER — HYDROMORPHONE HCL 1 MG/ML IJ SOLN
1.0000 mg | Freq: Once | INTRAMUSCULAR | Status: AC
  Administered 2018-11-11: 1 mg via INTRAVENOUS
  Filled 2018-11-11: qty 1

## 2018-11-11 MED ORDER — CIPROFLOXACIN IN D5W 400 MG/200ML IV SOLN
400.0000 mg | Freq: Two times a day (BID) | INTRAVENOUS | Status: DC
  Administered 2018-11-11: 400 mg via INTRAVENOUS
  Filled 2018-11-11: qty 200

## 2018-11-11 MED ORDER — HEPARIN SODIUM (PORCINE) 5000 UNIT/ML IJ SOLN
5000.0000 [IU] | Freq: Three times a day (TID) | INTRAMUSCULAR | Status: DC
  Administered 2018-11-11 – 2018-11-12 (×4): 5000 [IU] via SUBCUTANEOUS
  Filled 2018-11-11 (×5): qty 1

## 2018-11-11 MED ORDER — SODIUM CHLORIDE 0.9% FLUSH: 3.0000 mL | INTRAVENOUS | Status: DC | PRN

## 2018-11-11 MED ORDER — SODIUM CHLORIDE 0.9 % IV SOLN: 250.0000 mL | INTRAVENOUS | Status: DC | PRN

## 2018-11-11 MED ORDER — ONDANSETRON HCL 4 MG/2ML IJ SOLN
4.0000 mg | Freq: Four times a day (QID) | INTRAMUSCULAR | Status: DC | PRN
  Administered 2018-11-11 – 2018-11-12 (×3): 4 mg via INTRAVENOUS
  Filled 2018-11-11 (×3): qty 2

## 2018-11-11 MED ORDER — NICOTINE 21 MG/24HR TD PT24
21.0000 mg | MEDICATED_PATCH | Freq: Every day | TRANSDERMAL | Status: DC
  Administered 2018-11-11 – 2018-11-12 (×2): 21 mg via TRANSDERMAL
  Filled 2018-11-11 (×2): qty 1

## 2018-11-11 MED ORDER — ONDANSETRON HCL 4 MG PO TABS: 4.0000 mg | ORAL_TABLET | Freq: Four times a day (QID) | ORAL | Status: DC | PRN

## 2018-11-11 NOTE — ED Notes (Signed)
ED TO INPATIENT HANDOFF REPORT  Name/Age/Gender Frances Graham 53 y.o. female  Code Status Code Status History    Date Active Date Inactive Code Status Order ID Comments User Context   08/01/2016 1010 08/03/2016 1611 Full Code [id number]  Chuck Hintickson, Christopher S, MD Inpatient   06/23/2016 1954 07/03/2016 1520 Full Code [id number]  Russella DarEllis, Allison L, NP Inpatient      Home/SNF/Other Home  Chief Complaint abd pain  Level of Care/Admitting Diagnosis ED Disposition    ED Disposition Condition Comment   Admit  [facility] [[id number]]  Level of Care: Med-Surg [16]  Diagnosis: Small bowel obstruction [facility](HCC) [[id number]]) [[id number]]  Admitting Physician: Imogene BurnHEN, ERIC [[id number]]  Attending Physician: Imogene BurnHEN, ERIC [[id number]]  Estimated length of stay: 3 - 4 days  Certification:: I certify this patient will need inpatient services for at least 2 midnights  PT Class (Do Not Modify): Inpatient [101]  PT Acc Code (Do Not Modify): Private [1]       Medical History Past Medical History:  Diagnosis Date  . Anxiety    panic attacks- has then post op  . Asthma    as a child  . Attention deficit disorder   . Essential hypertension    off  medications, has lost 69 lbs  . Headache   . History of kidney stones    passed  . PAD (peripheral artery disease) (HCC)    Infrarenal aortic occlusion  . Temporomandibular joint disorder     Allergies Allergies  Allergen Reactions  . Bee Venom Anaphylaxis  . Penicillins Anaphylaxis    Has patient had a PCN reaction causing immediate rash, facial/tongue/throat swelling, SOB or lightheadedness with hypotension: Yes Has patient had a PCN reaction causing severe rash involving mucus membranes or skin necrosis: No Has patient had a PCN reaction that required hospitalization Yes Has patient had a PCN reaction occurring within the last 10 years: No If all of the above answers are "NO", then may proceed with Cephalosporin use.   . Benadryl  [Diphenhydramine Hcl] Other (See Comments)    syncope  . Celebrex [Celecoxib] Nausea And Vomiting and Rash  . Keflex [Cephalexin] Nausea And Vomiting, Rash and Other (See Comments)    Projectile vomiting, thrush, fever blisters, ED visit  . Lidocaine Swelling and Rash    Foot swelled up like a balloon  . Codeine Other (See Comments)    Reflux. Pt states not allergic to hydrocodone and oxycodone .    IV Location/Drains/Wounds Patient Lines/Drains/Airways Status   Active Line/Drains/Airways    Name:   Placement date:   Placement time:   Site:   Days:   Peripheral IV 11/10/18 Left Hand   11/10/18    1803    Hand   1   Peripheral IV 11/10/18 Left;Anterior Forearm   11/10/18    2036    Forearm   1   NG/OG Tube Nasogastric 16 Fr. Right nare Xray   11/10/18    2015    Right nare   1   Incision (Closed) 06/28/16 Abdomen Mid;Anterior   06/28/16    0835     866   Incision (Closed) 06/28/16 Groin Right   06/28/16    0905     866   Incision (Closed) 06/28/16 Groin Left   06/28/16    0905     866   Incision (Closed) 08/01/16 Foot Right   08/01/16    0816     832   Incision (Closed)  10/23/18 N/A Other (Comment)   10/23/18    0915     19          Labs/Imaging Results for orders placed or performed during the hospital encounter of 11/10/18 (from the past 48 hour(s))  Lipase, blood     Status: None   Collection Time: 11/10/18  2:03 PM  Result Value Ref Range   Lipase 22 11 - 51 U/L    Comment: Performed at [facility], [address] [place], [place] [address]  Comprehensive metabolic panel     Status: Abnormal   Collection Time: 11/10/18  2:03 PM  Result Value Ref Range   Sodium 136 135 - 145 mmol/L   Potassium 3.7 3.5 - 5.1 mmol/L   Chloride 104 98 - 111 mmol/L   CO2 24 22 - 32 mmol/L   Glucose, Bld 103 (H) 70 - 99 mg/dL   BUN 15 6 - 20 mg/dL   Creatinine, Ser 9.62 0.44 - 1.00 mg/dL   Calcium 8.7 (L) 8.9 - 10.3 mg/dL   Total Protein 7.1 6.5 - 8.1 g/dL   Albumin  4.0 3.5 - 5.0 g/dL   AST 13 (L) 15 - 41 U/L   ALT 13 0 - 44 U/L   Alkaline Phosphatase 69 38 - 126 U/L   Total Bilirubin 0.9 0.3 - 1.2 mg/dL   GFR calc non Af Amer >60 >60 mL/min   GFR calc Af Amer >60 >60 mL/min   Anion gap 8 5 - 15    Comment: Performed at [facility], [address], [place], [place] [address]  CBC     Status: Abnormal   Collection Time: 11/10/18  2:03 PM  Result Value Ref Range   WBC 16.8 (H) 4.0 - 10.5 K/uL   RBC 4.17 3.87 - 5.11 MIL/uL   Hemoglobin 13.4 12.0 - 15.0 g/dL   HCT 13.2 44.0 - 10.2 %   MCV 99.0 80.0 - 100.0 fL   MCH 32.1 26.0 - 34.0 pg   MCHC 32.4 30.0 - 36.0 g/dL   RDW 72.5 36.6 - 44.0 %   Platelets 202 150 - 400 K/uL   nRBC 0.0 0.0 - 0.2 %    Comment: Performed at [facility], [address] [place], [place] [address]  I-Stat beta hCG blood, ED     Status: None   Collection Time: 11/10/18  2:08 PM  Result Value Ref Range   I-stat hCG, quantitative <5.0 <5 mIU/mL   Comment 3            Comment:   GEST. AGE      CONC.  (mIU/mL)   <=1 WEEK        5 - 50     2 WEEKS       50 - 500     3 WEEKS       100 - 10,000     4 WEEKS     1,000 - 30,000        FEMALE AND NON-PREGNANT FEMALE:     LESS THAN 5 mIU/mL   Urinalysis, Routine w reflex microscopic     Status: Abnormal   Collection Time: 11/10/18  8:20 PM  Result Value Ref Range   Color, Urine YELLOW YELLOW   APPearance CLEAR CLEAR   Specific Gravity, Urine 1.035 (H) 1.005 - 1.030   pH 5.0 5.0 - 8.0   Glucose, UA NEGATIVE NEGATIVE mg/dL   Hgb urine dipstick  SMALL (A) NEGATIVE   Bilirubin Urine NEGATIVE NEGATIVE   Ketones, ur 20 (A) NEGATIVE mg/dL   Protein, ur NEGATIVE NEGATIVE mg/dL   Nitrite NEGATIVE NEGATIVE   Leukocytes,Ua NEGATIVE NEGATIVE   RBC / HPF 0-5 0 - 5 RBC/hpf   WBC, UA 0-5 0 - 5 WBC/hpf   Bacteria, UA NONE SEEN NONE SEEN   Mucus PRESENT     Comment: Performed at [facility], [address], [place], [place]  [address]  Type and screen [facility]     Status: None   Collection Time: 11/10/18  8:30 PM  Result Value Ref Range   ABO/RH(D) A POS    Antibody Screen NEG    Sample Expiration      11/13/2018 Performed at [facility], [address] [place], [place] [address]   Protime-INR     Status: None   Collection Time: 11/10/18  8:30 PM  Result Value Ref Range   Prothrombin Time 14.8 11.4 - 15.2 seconds   INR 1.17     Comment: Performed at [facility], [address], [place], [place] [address]  Lactic acid, plasma     Status: None   Collection Time: 11/10/18  8:30 PM  Result Value Ref Range   Lactic Acid, Venous 0.6 0.5 - 1.9 mmol/L    Comment: Performed at [facility], [address] [place], [place] [address]  ABO/Rh     Status: None (Preliminary result)   Collection Time: 11/10/18  8:30 PM  Result Value Ref Range   ABO/RH(D)      A POS Performed at [facility], [address] [place], [place] [address]   Wet prep, genital     Status: None   Collection Time: 11/10/18 10:53 PM  Result Value Ref Range   Yeast Wet Prep HPF POC NONE SEEN NONE SEEN    Comment: Swab received with less than 0.5 mL of saline, saline added to specimen, interpret results with caution.   Trich, Wet Prep NONE SEEN NONE SEEN   Clue Cells Wet Prep HPF POC NONE SEEN NONE SEEN   WBC, Wet Prep HPF POC NONE SEEN NONE SEEN   Sperm NONE SEEN     Comment: Performed at [facility], [address], [place], [place] [address]   Dg Abdomen 1 View  Result Date: 11/10/2018 CLINICAL DATA:  53 year old female with bowel obstruction due to herniated transverse colon, NG tube placement. EXAM: ABDOMEN - 1 VIEW COMPARISON:  CT Abdomen and Pelvis earlier today. FINDINGS: Portable AP supine view at 2003 hours. Enteric tube placed with side hole at the level of the gastric body. Stable visible bowel gas pattern.  Negative lung bases and mediastinal contours. IMPRESSION: Enteric tube placed into the stomach, side hole the level of the gastric body. Electronically Signed   By: Odessa Fleming M.D.   On: 11/10/2018 20:43   Ct Abdomen Pelvis W Contrast  Result Date: 11/10/2018 CLINICAL DATA:  53 year old female with abdominal pain, back pain, nausea, gross hematuria, urinary frequency, fever. EXAM: CT ABDOMEN AND PELVIS WITH CONTRAST TECHNIQUE: Multidetector CT imaging of the abdomen and pelvis was performed using the standard protocol following bolus administration of intravenous contrast. CONTRAST:  ISOVUE-300 IOPAMIDOL (ISOVUE-300) INJECTION 61% COMPARISON:  CT Abdomen and Pelvis 07/13/2016 end earlier. FINDINGS: Lower chest: Negative. Hepatobiliary: 15 millimeter gallstone in the gallbladder neck. No pericholecystic inflammation. No bile duct enlargement. Negative liver. Pancreas:  Negative. Spleen: Negative. Adrenals/Urinary Tract: Normal adrenal glands. Bilateral renal enhancement and contrast excretion is symmetric and normal. Decompressed proximal ureters. Diminutive and unremarkable urinary bladder. Chronic pelvic phleboliths. Stomach/Bowel: There is dilated small bowel throughout the abdomen to the terminal ileum. The right colon is likewise dilated with fluid to the level of the mid transverse where a 6-8 centimeter segment of the transverse colon is herniated ventrally on series 2, image 40. There are superimposed small caudal fat containing hernias between the herniated transverse colon and the umbilicus. And there is a single small fat herniation cephalad to the bowel hernia. See sagittal image 99. Downstream of this the large bowel is normal and mostly decompressed. The stomach, duodenum and proximal jejunum remain normal. No free air. No free fluid identified. The appendix remains within normal limits on coronal image 50. Vascular/Lymphatic: Calcified aortic atherosclerosis. Sequelae of aortic bypass. The bypass  appears patent. Portal venous system is patent. No lymphadenopathy. Reproductive: Ectatic tubular opacity associated with the left adnexa measuring up to 11 millimeters diameter is new since 2017 on series 2, image 77. The uterus and right ovary remain within normal limits. Other: No pelvic free fluid. Stable bilateral inguinal surgical clips. Musculoskeletal: No acute osseous abnormality identified. IMPRESSION: 1. 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 Subsequent moderate to high-grade large and small-bowel obstruction proximally. Multiple small fat containing hernias in the midline both cephalad and caudal to the bowel hernia. 2. New fluid-filled tubular structure at the left ovary since 2017 suspicious for inflammatory Hydrosalpinx. 3. Cholelithiasis without CT evidence of acute cholecystitis. 4.  Aortic Atherosclerosis (ICD10-I70.0).  Patent aortic bypass. Electronically Signed   By: Odessa Fleming M.D.   On: 11/10/2018 19:17   None  Pending Labs Unresulted Labs (From admission, onward)    Start     Ordered   11/10/18 1734  Urine culture  ONCE - STAT,   STAT     11/10/18 1733   Signed and Held  HIV antibody (Routine Testing)  Tomorrow morning,   R     Signed and Held   Signed and Held  Comprehensive metabolic panel  Tomorrow morning,   R     Signed and Held   Signed and Held  CBC with Differential/Platelet  Tomorrow morning,   R     Signed and Held          Vitals/Pain Today's Vitals   11/11/18 0000 11/11/18 0030 11/11/18 0100 11/11/18 0119  BP: (!) 96/57 (!) 96/57 99/63   Pulse: 90 87 86   Resp: (!) 25 13 13    Temp:      TempSrc:      SpO2: 95% 97% 96%   Weight:      Height:      PainSc:    9     Isolation Precautions No active isolations  Medications Medications  sodium chloride flush (NS) 0.9 % injection 3 mL (3 mLs Intravenous Not Given 11/10/18 1804)  iopamidol (ISOVUE-300) 61 % injection (has no administration in time range)   sodium chloride (PF) 0.9 % injection (has no administration in time range)  sodium chloride 0.9 % bolus 1,000 mL (0 mLs Intravenous Stopped 11/10/18 2018)  HYDROmorphone (DILAUDID) injection 1 mg (1 mg Intravenous Given 11/10/18 1803)  ondansetron (ZOFRAN) injection 4 mg (4 mg Intravenous Given 11/10/18 1804)  iopamidol (ISOVUE-300) 61 % injection 100 mL (100 mLs Intravenous Contrast Given 11/10/18 1833)  HYDROmorphone (DILAUDID) injection 1 mg (1 mg Intravenous Given  11/10/18 1947)  sodium chloride 0.9 % bolus 1,000 mL (0 mLs Intravenous Stopped 11/10/18 2149)  ciprofloxacin (CIPRO) IVPB 400 mg (0 mg Intravenous Stopped 11/10/18 2149)    And  metroNIDAZOLE (FLAGYL) IVPB 500 mg (0 mg Intravenous Stopped 11/11/18 0003)  gentamicin (GARAMYCIN) 500 mg in dextrose 5 % 100 mL IVPB (0 mg Intravenous Stopped 11/10/18 2253)  HYDROmorphone (DILAUDID) injection 1 mg (1 mg Intravenous Given 11/10/18 2150)  0.9 %  sodium chloride infusion ( Intravenous Stopped 11/11/18 0123)  diatrizoate meglumine-sodium (GASTROGRAFIN) 66-10 % solution 90 mL (90 mLs Per NG tube Given 11/10/18 2309)  HYDROmorphone (DILAUDID) injection 1 mg (1 mg Intravenous Given 11/11/18 0121)    Mobility walks

## 2018-11-11 NOTE — Progress Notes (Signed)
Subjective/Chief Complaint: She has now had multiple loose BM's Reports pain is less   Objective: Vital signs in last 24 hours: Temp:  [98 F (36.7 C)-99.6 F (37.6 C)] 99.6 F (37.6 C) (02/19 0551) Pulse Rate:  [84-101] 92 (02/19 0551) Resp:  [10-25] 20 (02/19 0551) BP: (91-127)/(53-84) 91/60 (02/19 0551) SpO2:  [91 %-100 %] 97 % (02/19 0551) Weight:  [81.6 kg] 81.6 kg (02/18 1353) Last BM Date: 11/10/18  Intake/Output from previous day: 02/18 0701 - 02/19 0700 In: 3289.3 [I.V.:894.4; IV Piggyback:2394.9] Out: 800 [Urine:800] Intake/Output this shift: No intake/output data recorded.  Exam: Awake and alert Abdomen soft, mildly tender at midline where hernia is reducible Lab Results:  Recent Labs    11/10/18 1403 11/11/18 0443  WBC 16.8* 12.5*  HGB 13.4 11.3*  HCT 41.3 35.8*  PLT 202 182   BMET Recent Labs    11/10/18 1403 11/11/18 0443  NA 136 138  K 3.7 4.3  CL 104 110  CO2 24 22  GLUCOSE 103* 91  BUN 15 12  CREATININE 0.69 0.72  CALCIUM 8.7* 7.8*   PT/INR Recent Labs    11/10/18 2030  LABPROT 14.8  INR 1.17   ABG No results for input(s): PHART, HCO3 in the last 72 hours.  Invalid input(s): PCO2, PO2  Studies/Results: Dg Abdomen 1 View  Result Date: 11/10/2018 CLINICAL DATA:  53 year old female with bowel obstruction due to herniated transverse colon, NG tube placement. EXAM: ABDOMEN - 1 VIEW COMPARISON:  CT Abdomen and Pelvis earlier today. FINDINGS: Portable AP supine view at 2003 hours. Enteric tube placed with side hole at the level of the gastric body. Stable visible bowel gas pattern. Negative lung bases and mediastinal contours. IMPRESSION: Enteric tube placed into the stomach, side hole the level of the gastric body. Electronically Signed   By: Odessa Fleming M.D.   On: 11/10/2018 20:43   Ct Abdomen Pelvis W Contrast  Result Date: 11/10/2018 CLINICAL DATA:  53 year old female with abdominal pain, back pain, nausea, gross hematuria,  urinary frequency, fever. EXAM: CT ABDOMEN AND PELVIS WITH CONTRAST TECHNIQUE: Multidetector CT imaging of the abdomen and pelvis was performed using the standard protocol following bolus administration of intravenous contrast. CONTRAST:  ISOVUE-300 IOPAMIDOL (ISOVUE-300) INJECTION 61% COMPARISON:  CT Abdomen and Pelvis 07/13/2016 end earlier. FINDINGS: Lower chest: Negative. Hepatobiliary: 15 millimeter gallstone in the gallbladder neck. No pericholecystic inflammation. No bile duct enlargement. Negative liver. Pancreas: Negative. Spleen: Negative. Adrenals/Urinary Tract: Normal adrenal glands. Bilateral renal enhancement and contrast excretion is symmetric and normal. Decompressed proximal ureters. Diminutive and unremarkable urinary bladder. Chronic pelvic phleboliths. Stomach/Bowel: There is dilated small bowel throughout the abdomen to the terminal ileum. The right colon is likewise dilated with fluid to the level of the mid transverse where a 6-8 centimeter segment of the transverse colon is herniated ventrally on series 2, image 40. There are superimposed small caudal fat containing hernias between the herniated transverse colon and the umbilicus. And there is a single small fat herniation cephalad to the bowel hernia. See sagittal image 99. Downstream of this the large bowel is normal and mostly decompressed. The stomach, duodenum and proximal jejunum remain normal. No free air. No free fluid identified. The appendix remains within normal limits on coronal image 50. Vascular/Lymphatic: Calcified aortic atherosclerosis. Sequelae of aortic bypass. The bypass appears patent. Portal venous system is patent. No lymphadenopathy. Reproductive: Ectatic tubular opacity associated with the left adnexa measuring up to 11 millimeters diameter is new since 2017  on series 2, image 77. The uterus and right ovary remain within normal limits. Other: No pelvic free fluid. Stable bilateral inguinal surgical clips.  Musculoskeletal: No acute osseous abnormality identified. IMPRESSION: 1. 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 Subsequent moderate to high-grade large and small-bowel obstruction proximally. Multiple small fat containing hernias in the midline both cephalad and caudal to the bowel hernia. 2. New fluid-filled tubular structure at the left ovary since 2017 suspicious for inflammatory Hydrosalpinx. 3. Cholelithiasis without CT evidence of acute cholecystitis. 4.  Aortic Atherosclerosis (ICD10-I70.0).  Patent aortic bypass. Electronically Signed   By: Odessa Fleming M.D.   On: 11/10/2018 19:17   Dg Abd Portable 1v-small Bowel Obstruction Protocol-initial, 8 Hr Delay  Result Date: 11/11/2018 CLINICAL DATA:  Recent bowel obstruction EXAM: PORTABLE ABDOMEN - 1 VIEW COMPARISON:  CT abdomen and pelvis November 10, 2018; abdominal radiograph November 10, 2018 FINDINGS: Nasogastric tube tip and side port in stomach. Contrast is in the colon and rectum. There is currently no bowel dilatation or air-fluid level to suggest bowel obstruction. No free air. There are small phleboliths in the pelvis. IMPRESSION: Contrast present in colon and rectum. No appreciable bowel dilatation or air-fluid level. No free air. Nasogastric tube tip and side port in stomach. Electronically Signed   By: Bretta Bang III M.D.   On: 11/11/2018 07:51    Anti-infectives: Anti-infectives (From admission, onward)   Start     Dose/Rate Route Frequency Ordered Stop   11/11/18 0800  ciprofloxacin (CIPRO) IVPB 400 mg     400 mg 200 mL/hr over 60 Minutes Intravenous Every 12 hours 11/11/18 0221     11/11/18 0600  metroNIDAZOLE (FLAGYL) IVPB 500 mg     500 mg 100 mL/hr over 60 Minutes Intravenous Every 8 hours 11/11/18 0221     11/10/18 2100  gentamicin (GARAMYCIN) 500 mg in dextrose 5 % 100 mL IVPB     500 mg 112.5 mL/hr over 60 Minutes Intravenous  Once 11/10/18 2017 11/10/18 2253   11/10/18  2015  ciprofloxacin (CIPRO) IVPB 400 mg     400 mg 200 mL/hr over 60 Minutes Intravenous  Once 11/10/18 2009 11/10/18 2149   11/10/18 2015  metroNIDAZOLE (FLAGYL) IVPB 500 mg     500 mg 100 mL/hr over 60 Minutes Intravenous  Once 11/10/18 2009 11/11/18 0003      Assessment/Plan:  Incarcerated incisional hernia  Doubt small bowel obstruction especially given multiple BM's Undergoing small bowel protocol.  If negative, will d/c the NG Still may need repair of the incisional hernia this week   LOS: 0 days    Frances Graham 11/11/2018

## 2018-11-11 NOTE — Assessment & Plan Note (Signed)
Pt offered nicotine patch. 

## 2018-11-11 NOTE — H&P (Addendum)
History and Physical    Frances Graham XFQ:[id number] DOB: 1966-03-17 DOA: 11/10/2018  PCP: Kaleen Mask, MD  Patient coming from: home  I have personally briefly reviewed patient's old medical records in [facility]  Chief Complaint: abd pain  HPI: Frances Graham is a 53 y.o. female with medical history significant of history of aortobifem bypass several years ago, presents to the hospital with 4-day history of worsening abdominal pain.  Patient states that she has passed several kidney stones recently.  She has been in contact with her outpatient urologist who prescribed a p.o. antibiotic for her.  Patient's been struggling with worsening abdominal pain mostly in the suprapubic region for the last several days.  She finally came to the ER.  CT scan demonstrated high-grade small bowel obstruction.  Patient has been seen by general surgery.  (Dr. Andrey Campanile).  He does not feel the patient needs emergent surgery.  Patient had a normal bowel movement yesterday.  She has passed some flatus today.  ER provider performed a pelvic exam which was negative for any cervical motion tenderness.  Patient is currently on her menstrual cycle.  Patient has NG tube now to low wall suction.   Patient states since her aortobifem surgery, she is lost over 60 pounds and has successfully stopped taking her hypertension medications.  She currently takes no scheduled prescription medications.  ED Course:  Patient started IV fluids.  Given IV Dilaudid.  NG tube was placed.  Review of Systems: Review of Systems  Constitutional: Positive for malaise/fatigue.  HENT: Negative.   Eyes: Negative.   Respiratory: Negative.   Cardiovascular: Negative.  Negative for chest pain.  Gastrointestinal: Positive for abdominal pain. Negative for blood in stool and melena.       Diffuse abd pain, patient admits to feeling her bowels "rolling".  Large majority of her pain now is in the right lower quadrant.    Genitourinary:       Patient states that she is recently passed several kidney stones.  Musculoskeletal: Positive for back pain.  Skin: Negative.   Neurological: Negative.   Endo/Heme/Allergies: Negative.   Psychiatric/Behavioral: Negative.   All other systems reviewed and are negative.   Past Medical History:  Diagnosis Date  . Anxiety    panic attacks- has then post op  . Asthma    as a child  . Attention deficit disorder   . Essential hypertension    off  medications, has lost 69 lbs  . Headache   . History of kidney stones    passed  . PAD (peripheral artery disease) (HCC)    Infrarenal aortic occlusion  . Temporomandibular joint disorder     Past Surgical History:  Procedure Laterality Date  . AMPUTATION Right 08/01/2016   Procedure: AMPUTATION GREAT TOE;  Surgeon: Chuck Hint, MD;  Location: [facility] OR;  Service: Vascular;  Laterality: Right;  . AMPUTATION TOE Right 08/01/2016  . AORTA - BILATERAL FEMORAL ARTERY BYPASS GRAFT Bilateral 06/28/2016   Procedure: AORTOBIFEMORAL BYPASS GRAFT;  Surgeon: Chuck Hint, MD;  Location: [facility] OR;  Service: Vascular;  Laterality: Bilateral;  . BIOPSY THYROID    . LEG SURGERY Left 1970   pin  to fix cogental hip  . MENISCUS REPAIR Left    2015  . TOOTH EXTRACTION Bilateral 10/23/2018   Procedure: DENTAL RESTORATION/EXTRACTIONS;  Surgeon: Ocie Doyne, DDS;  Location: [facility] OR;  Service: Oral Surgery;  Laterality: Bilateral;     reports that she has  been smoking cigarettes. She has a 5.00 pack-year smoking history. She has never used smokeless tobacco. She reports that she does not drink alcohol or use drugs.  Allergies  Allergen Reactions  . Bee Venom Anaphylaxis  . Penicillins Anaphylaxis    Has patient had a PCN reaction causing immediate rash, facial/tongue/throat swelling, SOB or lightheadedness with hypotension: Yes Has patient had a PCN reaction causing severe rash involving mucus membranes or skin necrosis:  No Has patient had a PCN reaction that required hospitalization Yes Has patient had a PCN reaction occurring within the last 10 years: No If all of the above answers are "NO", then may proceed with Cephalosporin use.   . Benadryl [Diphenhydramine Hcl] Other (See Comments)    syncope  . Celebrex [Celecoxib] Nausea And Vomiting and Rash  . Keflex [Cephalexin] Nausea And Vomiting, Rash and Other (See Comments)    Projectile vomiting, thrush, fever blisters, ED visit  . Lidocaine Swelling and Rash    Foot swelled up like a balloon  . Codeine Other (See Comments)    Reflux. Pt states not allergic to hydrocodone and oxycodone .    Family History  Problem Relation Age of Onset  . COPD Mother   . CAD Mother   . CAD Maternal Grandmother   . Diabetes Maternal Uncle    Prior to Admission medications   Medication Sig Start Date End Date Taking? Authorizing Provider  ibuprofen (ADVIL,MOTRIN) 200 MG tablet Take 400 mg by mouth every 8 (eight) hours as needed for moderate pain.    Yes [provider]  methylphenidate (RITALIN) 20 MG tablet Take 20 mg by mouth See admin instructions. Take 1 tablet (20 mg) by mouth up to 3 times daily for focus (Does not take on weekends)   Yes [provider]  clindamycin (CLEOCIN) 300 MG capsule Take 1 capsule (300 mg total) by mouth 3 (three) times daily. Patient not taking: Reported on 11/10/2018 10/23/18   Ocie Doyne, DDS  oxyCODONE-acetaminophen (PERCOCET) 5-325 MG tablet Take 1 tablet by mouth every 4 (four) hours as needed. Patient not taking: Reported on 11/10/2018 10/23/18   Ocie Doyne, DDS    Physical Exam: Vitals:   11/10/18 2100 11/10/18 2130 11/10/18 2200 11/10/18 2230  BP: (!) 95/58 99/60 (!) 94/58 (!) 96/53  Pulse: 98 100 94 91  Resp: 16 (!) 22 10 17   Temp:      TempSrc:      SpO2: 92% 94% 91% 98%  Weight:      Height:        Constitutional: NAD, calm, comfortable Vitals:   11/10/18 2100 11/10/18 2130 11/10/18 2200  11/10/18 2230  BP: (!) 95/58 99/60 (!) 94/58 (!) 96/53  Pulse: 98 100 94 91  Resp: 16 (!) 22 10 17   Temp:      TempSrc:      SpO2: 92% 94% 91% 98%  Weight:      Height:       Physical Exam  Constitutional: She is oriented to person, place, and time. She appears distressed.  HENT:  Head: Normocephalic and atraumatic.  Eyes: Right eye exhibits no discharge. Left eye exhibits no discharge.  Neck: No JVD present.  Cardiovascular: Normal rate and regular rhythm.  Pulmonary/Chest: Effort normal and breath sounds normal. No respiratory distress. She has no wheezes.  Abdominal: Bowel sounds are hyperactive. There is abdominal tenderness in the right lower quadrant and suprapubic area. There is no rigidity and no guarding. A hernia is present. Hernia  confirmed positive in the ventral area.    Musculoskeletal:        General: No deformity or edema.     Comments: Partial right foot amputation  Neurological: She is alert and oriented to person, place, and time.  Skin: Skin is warm and dry. She is not diaphoretic.  Nursing note and vitals reviewed.    Labs on Admission: I have personally reviewed following labs and imaging studies  CBC: Recent Labs  Lab 11/10/18 1403  WBC 16.8*  HGB 13.4  HCT 41.3  MCV 99.0  PLT 202   Basic Metabolic Panel: Recent Labs  Lab 11/10/18 1403  NA 136  K 3.7  CL 104  CO2 24  GLUCOSE 103*  BUN 15  CREATININE 0.69  CALCIUM 8.7*   GFR: Estimated Creatinine Clearance: 93.4 mL/min (by C-G formula based on SCr of 0.69 mg/dL). Liver Function Tests: Recent Labs  Lab 11/10/18 1403  AST 13*  ALT 13  ALKPHOS 69  BILITOT 0.9  PROT 7.1  ALBUMIN 4.0   Recent Labs  Lab 11/10/18 1403  LIPASE 22   No results for input(s): AMMONIA in the last 168 hours. Coagulation Profile: Recent Labs  Lab 11/10/18 2030  INR 1.17   Cardiac Enzymes: No results for input(s): CKTOTAL, CKMB, CKMBINDEX, TROPONINI in the last 168 hours. BNP (last 3  results) No results for input(s): PROBNP in the last 8760 hours. HbA1C: No results for input(s): HGBA1C in the last 72 hours. CBG: No results for input(s): GLUCAP in the last 168 hours. Lipid Profile: No results for input(s): CHOL, HDL, LDLCALC, TRIG, CHOLHDL, LDLDIRECT in the last 72 hours. Thyroid Function Tests: No results for input(s): TSH, T4TOTAL, FREET4, T3FREE, THYROIDAB in the last 72 hours. Anemia Panel: No results for input(s): VITAMINB12, FOLATE, FERRITIN, TIBC, IRON, RETICCTPCT in the last 72 hours. Urine analysis:    Component Value Date/Time   COLORURINE YELLOW 11/10/2018 2020   APPEARANCEUR CLEAR 11/10/2018 2020   LABSPEC 1.035 (H) 11/10/2018 2020   PHURINE 5.0 11/10/2018 2020   GLUCOSEU NEGATIVE 11/10/2018 2020   HGBUR SMALL (A) 11/10/2018 2020   BILIRUBINUR NEGATIVE 11/10/2018 2020   KETONESUR 20 (A) 11/10/2018 2020   PROTEINUR NEGATIVE 11/10/2018 2020   UROBILINOGEN 0.2 08/08/2013 1430   NITRITE NEGATIVE 11/10/2018 2020   LEUKOCYTESUR NEGATIVE 11/10/2018 2020    Radiological Exams on Admission: Dg Abdomen 1 View  Result Date: 11/10/2018 CLINICAL DATA:  53 year old female with bowel obstruction due to herniated transverse colon, NG tube placement. EXAM: ABDOMEN - 1 VIEW COMPARISON:  CT Abdomen and Pelvis earlier today. FINDINGS: Portable AP supine view at 2003 hours. Enteric tube placed with side hole at the level of the gastric body. Stable visible bowel gas pattern. Negative lung bases and mediastinal contours. IMPRESSION: Enteric tube placed into the stomach, side hole the level of the gastric body. Electronically Signed   By: Odessa Fleming M.D.   On: 11/10/2018 20:43   Ct Abdomen Pelvis W Contrast  Result Date: 11/10/2018 CLINICAL DATA:  53 year old female with abdominal pain, back pain, nausea, gross hematuria, urinary frequency, fever. EXAM: CT ABDOMEN AND PELVIS WITH CONTRAST TECHNIQUE: Multidetector CT imaging of the abdomen and pelvis was performed using the  standard protocol following bolus administration of intravenous contrast. CONTRAST:  ISOVUE-300 IOPAMIDOL (ISOVUE-300) INJECTION 61% COMPARISON:  CT Abdomen and Pelvis 07/13/2016 end earlier. FINDINGS: Lower chest: Negative. Hepatobiliary: 15 millimeter gallstone in the gallbladder neck. No pericholecystic inflammation. No bile duct enlargement. Negative liver. Pancreas: Negative.  Spleen: Negative. Adrenals/Urinary Tract: Normal adrenal glands. Bilateral renal enhancement and contrast excretion is symmetric and normal. Decompressed proximal ureters. Diminutive and unremarkable urinary bladder. Chronic pelvic phleboliths. Stomach/Bowel: There is dilated small bowel throughout the abdomen to the terminal ileum. The right colon is likewise dilated with fluid to the level of the mid transverse where a 6-8 centimeter segment of the transverse colon is herniated ventrally on series 2, image 40. There are superimposed small caudal fat containing hernias between the herniated transverse colon and the umbilicus. And there is a single small fat herniation cephalad to the bowel hernia. See sagittal image 99. Downstream of this the large bowel is normal and mostly decompressed. The stomach, duodenum and proximal jejunum remain normal. No free air. No free fluid identified. The appendix remains within normal limits on coronal image 50. Vascular/Lymphatic: Calcified aortic atherosclerosis. Sequelae of aortic bypass. The bypass appears patent. Portal venous system is patent. No lymphadenopathy. Reproductive: Ectatic tubular opacity associated with the left adnexa measuring up to 11 millimeters diameter is new since 2017 on series 2, image 77. The uterus and right ovary remain within normal limits. Other: No pelvic free fluid. Stable bilateral inguinal surgical clips. Musculoskeletal: No acute osseous abnormality identified. IMPRESSION: 1. 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  Subsequent moderate to high-grade large and small-bowel obstruction proximally. Multiple small fat containing hernias in the midline both cephalad and caudal to the bowel hernia. 2. New fluid-filled tubular structure at the left ovary since 2017 suspicious for inflammatory Hydrosalpinx. 3. Cholelithiasis without CT evidence of acute cholecystitis. 4.  Aortic Atherosclerosis (ICD10-I70.0).  Patent aortic bypass. Electronically Signed   By: Odessa FlemingH  Hall M.D.   On: 11/10/2018 19:17    EKG: Independently reviewed. None  Assessment/Plan Principal Problem:   SBO (small bowel obstruction) (HCC) Active Problems:   Leukocytosis   Tobacco abuse   Small bowel obstruction (HCC)  SBO (small bowel obstruction) (HCC) Admit to medical bed. Discussed with Dr. Andrey CampanileWilson. NG tube to LWS. Agree with Dr. Andrey CampanileWilson that pt does not need emergent surgery. Agreement made with Dr. Andrey CampanileWilson that if pt requires ex lap, that pt will go to surgical service post-op as the patient has no acute or chronic medical needs. Pt takes no routine Rx medications.   Leukocytosis Likely due to SBO  Tobacco abuse Pt offered nicotine patch   DVT prophylaxis: heparin SQ Code Status: FULL Family Communication: discussed with pt and her dtr Carolynn Dass at bedside.  Disposition Plan: home when SBO resolves Consults called: Dr. Ivery QualeWilson(General surgery) Admission status: inpatient   Carollee HerterEric Betsie Peckman DO [facility]  11/11/2018, 12:53 AM

## 2018-11-11 NOTE — Progress Notes (Signed)
Admission from earlier this morning. 53 year old female with history of aortobifemoral bypass several years ago presenting with abdominal pain.  Reports passing several kidney stones recently.  Treated with oral antibiotic by his urologist UTI?  Finally presented to ED. CT scan concerning for high-grade small bowel obstruction.  Evaluated by general surgery. Patient reports flatus and multiple bowel movements.  Denies melena or hematochezia.  Pain improved.  Vitals:   11/11/18 0100 11/11/18 0229 11/11/18 0551 11/11/18 1413  BP: 99/63 99/65 91/60  98/60  Pulse: 86 87 92 80  Resp: 13 18 20 17   Temp:  98 F (36.7 C) 99.6 F (37.6 C) 98.8 F (37.1 C)  TempSrc:  Oral Oral Oral  SpO2: 96% 99% 97% 98%  Weight:      Height:       On exam, no acute distress.  NG tube in place with significant return.  Heart, lung and abdominal exam within normal range.  Bowel sounds present normal.  No tenderness to palpation.  Assessment and plan:  Possible SBO/ulcerated incisional hernia: Patient with history of prior abdominal surgery for aorto-bifemoral bypass. -Reports passing flatus and multiple bowel movements. -General surgery following-considering repair of incisional hernia this week -Doubt infectious process at this time.  Will discontinue antibiotics.  Leukocytosis:  Likely stress-induced.  Improved.  Tobacco use disorder -Nicotine patch

## 2018-11-11 NOTE — ED Notes (Signed)
Gave report to Isabelle Course, Charity fundraiser for room [phone].

## 2018-11-11 NOTE — Assessment & Plan Note (Signed)
Likely due to SBO

## 2018-11-11 NOTE — Assessment & Plan Note (Addendum)
Admit to medical bed. Discussed with Dr. Andrey Campanile. NG tube to LWS. Agree with Dr. Andrey Campanile that pt does not need emergent surgery. Agreement made with Dr. Andrey Campanile that if pt requires ex lap, that pt will go to surgical service post-op as the patient has no acute or chronic medical needs. Pt takes no routine Rx medications.

## 2018-11-12 ENCOUNTER — Inpatient Hospital Stay (HOSPITAL_COMMUNITY): Payer: Medicaid Other

## 2018-11-12 DIAGNOSIS — K430 Incisional hernia with obstruction, without gangrene: Principal | ICD-10-CM

## 2018-11-12 LAB — CBC
HCT: 32.7 % — ABNORMAL LOW (ref 36.0–46.0)
Hemoglobin: 9.9 g/dL — ABNORMAL LOW (ref 12.0–15.0)
MCH: 31.4 pg (ref 26.0–34.0)
MCHC: 30.3 g/dL (ref 30.0–36.0)
MCV: 103.8 fL — ABNORMAL HIGH (ref 80.0–100.0)
PLATELETS: 183 10*3/uL (ref 150–400)
RBC: 3.15 MIL/uL — AB (ref 3.87–5.11)
RDW: 12.9 % (ref 11.5–15.5)
WBC: 8.9 10*3/uL (ref 4.0–10.5)
nRBC: 0 % (ref 0.0–0.2)

## 2018-11-12 LAB — URINE CULTURE: Culture: NO GROWTH

## 2018-11-12 LAB — BASIC METABOLIC PANEL
ANION GAP: 4 — AB (ref 5–15)
BUN: 10 mg/dL (ref 6–20)
CO2: 21 mmol/L — ABNORMAL LOW (ref 22–32)
Calcium: 8.1 mg/dL — ABNORMAL LOW (ref 8.9–10.3)
Chloride: 113 mmol/L — ABNORMAL HIGH (ref 98–111)
Creatinine, Ser: 0.52 mg/dL (ref 0.44–1.00)
GFR calc Af Amer: >60 mL/min (ref >60)
GFR calc non Af Amer: >60 mL/min (ref >60)
GLUCOSE: 87 mg/dL (ref 70–99)
Potassium: 3.9 mmol/L (ref 3.5–5.1)
Sodium: 138 mmol/L (ref 135–145)

## 2018-11-12 LAB — GC/CHLAMYDIA PROBE AMP (~~LOC~~) NOT AT ARMC
Chlamydia: NEGATIVE
Neisseria Gonorrhea: NEGATIVE

## 2018-11-12 MED ORDER — FLUCONAZOLE 150 MG PO TABS
150.0000 mg | ORAL_TABLET | Freq: Once | ORAL | Status: AC
  Administered 2018-11-12: 150 mg via ORAL
  Filled 2018-11-12: qty 1

## 2018-11-12 MED ORDER — NICOTINE 21 MG/24HR TD PT24: 21.0000 mg | MEDICATED_PATCH | Freq: Every day | TRANSDERMAL | 0 refills | Status: DC

## 2018-11-12 NOTE — Discharge Summary (Signed)
Physician Discharge Summary  Frances Graham ENI:[id number] DOB: 1966/09/02 DOA: 11/10/2018  PCP: Kaleen Mask, MD  Admit date: 11/10/2018 Discharge date: 11/12/2018  Admitted From: Home Disposition: Home  Recommendations for Outpatient Follow-up:  1. Follow up with PCP in 1-2 weeks 2. Please obtain BMP/CBC in one week 3. Please follow up on the following pending results:  Home Health: None Equipment/Devices: None  Discharge Condition: Stable CODE STATUS: Full code   Hospital Course: Admission from earlier this morning. 53 year old female with history of aortobifemoral bypass several years ago presenting with abdominal pain.  Reports passing several kidney stones recently.  Treated with oral antibiotic by his urologist UTI?  Finally presented to ED. CT scan concerning for high-grade small bowel obstruction.  Evaluated by general surgery.  NG tube placed.  However, patient had multiple bowel movements later the day of admission.  NG tube discontinued.  Diagnosis thought to be incarcerated incisional hernia versus SBO.  Patient remained stable.  Abdominal pain improved significantly.  Tolerated soft diet and cleared for discharge by general surgery.   Discharge Diagnoses:  Principal Problem:   SBO (small bowel obstruction) (HCC) Active Problems:   Leukocytosis   Tobacco abuse   Small bowel obstruction (HCC)  Incarcerated incisional hernia versus SBO: Symptoms resolved.  Had multiple bowel movements.  Patient tolerated soft diet.  Cleared from surgical standpoint by general surgery.  Patient to follow-up with general surgery.    Patient was started on Flagyl and metronidazole on admission.  He has no obvious source of infection.  So antibiotics discontinued.  Leukocytosis:  Likely stress-induced.   Resolved.  Tobacco use disorder -Nicotine patch -Encourage cessation. Discharge Instructions  Discharge Instructions    Call MD for:  difficulty breathing,  headache or visual disturbances   Complete by:  As directed    Call MD for:  persistant nausea and vomiting   Complete by:  As directed    Call MD for:  severe uncontrolled pain   Complete by:  As directed    Call MD for:  temperature >100.4   Complete by:  As directed    Diet - low sodium heart healthy   Complete by:  As directed    Discharge instructions   Complete by:  As directed    It has been a pleasure taking care of you! You were admitted out of concern for bowel obstruction.  This has resolved on its own without surgery.  Follow-up with your surgeon and primary care doctor.   Once you are discharged, your primary care physician will handle any further medical issues. Please note that NO REFILLS for any discharge medications will be authorized once you are discharged, as it is imperative that you return to your primary care physician (or establish a relationship with a primary care physician if you do not have one) for your aftercare needs so that they can reassess your need for medications and monitor your lab values. Take care,   Increase activity slowly   Complete by:  As directed      Allergies as of 11/12/2018      Reactions   Bee Venom Anaphylaxis   Penicillins Anaphylaxis   Has patient had a PCN reaction causing immediate rash, facial/tongue/throat swelling, SOB or lightheadedness with hypotension: Yes Has patient had a PCN reaction causing severe rash involving mucus membranes or skin necrosis: No Has patient had a PCN reaction that required hospitalization Yes Has patient had a PCN reaction occurring within the last 10  years: No If all of the above answers are "NO", then may proceed with Cephalosporin use.   Benadryl [diphenhydramine Hcl] Other (See Comments)   syncope   Celebrex [celecoxib] Nausea And Vomiting, Rash   Keflex [cephalexin] Nausea And Vomiting, Rash, Other (See Comments)   Projectile vomiting, thrush, fever blisters, ED visit   Lidocaine Swelling,  Rash   Foot swelled up like a balloon   Codeine Other (See Comments)   Reflux. Pt states not allergic to hydrocodone and oxycodone .      Medication List    STOP taking these medications   clindamycin 300 MG capsule Commonly known as:  CLEOCIN   ibuprofen 200 MG tablet Commonly known as:  ADVIL,MOTRIN   oxyCODONE-acetaminophen 5-325 MG tablet Commonly known as:  PERCOCET     TAKE these medications   methylphenidate 20 MG tablet Commonly known as:  RITALIN Take 20 mg by mouth See admin instructions. Take 1 tablet (20 mg) by mouth up to 3 times daily for focus (Does not take on weekends)   nicotine 21 mg/24hr patch Commonly known as:  NICODERM CQ - dosed in mg/24 hours Place 1 patch (21 mg total) onto the skin daily. Start taking on:  November 13, 2018      Follow-up Information    Abigail Miyamoto, MD Follow up.   Specialty:  General Surgery Why:  Call to confirm appointment date/time. Please arrive 30 min prior to appointment time. Bring photo ID and insurance information.  Contact information: [address] [place] [address] [phone]        Kaleen Mask, MD. Schedule an appointment as soon as possible for a visit in 1 week(s).   Specialty:  Family Medicine Contact information: [address] [place] [address] [phone]           Consultations:  Neurosurgery  Procedures/Studies:  2D Echo: None obtained this admission.  Abd 1 View (kub)  Result Date: 11/12/2018 CLINICAL DATA:  Small bowel obstruction. EXAM: ABDOMEN - 1 VIEW COMPARISON:  Radiograph of November 11, 2018. FINDINGS: The bowel gas pattern is normal. Residual contrast is seen throughout the colon. No radio-opaque calculi or other significant radiographic abnormality are seen. IMPRESSION: No evidence of bowel obstruction or ileus. Electronically Signed   By: Lupita Raider, M.D.   On: 11/12/2018 09:11   Dg Abdomen 1 View  Result Date:  11/10/2018 CLINICAL DATA:  53 year old female with bowel obstruction due to herniated transverse colon, NG tube placement. EXAM: ABDOMEN - 1 VIEW COMPARISON:  CT Abdomen and Pelvis earlier today. FINDINGS: Portable AP supine view at 2003 hours. Enteric tube placed with side hole at the level of the gastric body. Stable visible bowel gas pattern. Negative lung bases and mediastinal contours. IMPRESSION: Enteric tube placed into the stomach, side hole the level of the gastric body. Electronically Signed   By: Odessa Fleming M.D.   On: 11/10/2018 20:43   Ct Abdomen Pelvis W Contrast  Result Date: 11/10/2018 CLINICAL DATA:  53 year old female with abdominal pain, back pain, nausea, gross hematuria, urinary frequency, fever. EXAM: CT ABDOMEN AND PELVIS WITH CONTRAST TECHNIQUE: Multidetector CT imaging of the abdomen and pelvis was performed using the standard protocol following bolus administration of intravenous contrast. CONTRAST:  ISOVUE-300 IOPAMIDOL (ISOVUE-300) INJECTION 61% COMPARISON:  CT Abdomen and Pelvis 07/13/2016 end earlier. FINDINGS: Lower chest: Negative. Hepatobiliary: 15 millimeter gallstone in the gallbladder neck. No pericholecystic inflammation. No bile duct enlargement. Negative liver. Pancreas: Negative.  Spleen: Negative. Adrenals/Urinary Tract: Normal adrenal glands. Bilateral renal enhancement and contrast excretion is symmetric and normal. Decompressed proximal ureters. Diminutive and unremarkable urinary bladder. Chronic pelvic phleboliths. Stomach/Bowel: There is dilated small bowel throughout the abdomen to the terminal ileum. The right colon is likewise dilated with fluid to the level of the mid transverse where a 6-8 centimeter segment of the transverse colon is herniated ventrally on series 2, image 40. There are superimposed small caudal fat containing hernias between the herniated transverse colon and the umbilicus. And there is a single small fat herniation cephalad to the bowel  hernia. See sagittal image 99. Downstream of this the large bowel is normal and mostly decompressed. The stomach, duodenum and proximal jejunum remain normal. No free air. No free fluid identified. The appendix remains within normal limits on coronal image 50. Vascular/Lymphatic: Calcified aortic atherosclerosis. Sequelae of aortic bypass. The bypass appears patent. Portal venous system is patent. No lymphadenopathy. Reproductive: Ectatic tubular opacity associated with the left adnexa measuring up to 11 millimeters diameter is new since 2017 on series 2, image 77. The uterus and right ovary remain within normal limits. Other: No pelvic free fluid. Stable bilateral inguinal surgical clips. Musculoskeletal: No acute osseous abnormality identified. IMPRESSION: 1. 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 Subsequent moderate to high-grade large and small-bowel obstruction proximally. Multiple small fat containing hernias in the midline both cephalad and caudal to the bowel hernia. 2. New fluid-filled tubular structure at the left ovary since 2017 suspicious for inflammatory Hydrosalpinx. 3. Cholelithiasis without CT evidence of acute cholecystitis. 4.  Aortic Atherosclerosis (ICD10-I70.0).  Patent aortic bypass. Electronically Signed   By: Odessa Fleming M.D.   On: 11/10/2018 19:17   Dg Abd Portable 1v-small Bowel Obstruction Protocol-initial, 8 Hr Delay  Result Date: 11/11/2018 CLINICAL DATA:  Recent bowel obstruction EXAM: PORTABLE ABDOMEN - 1 VIEW COMPARISON:  CT abdomen and pelvis November 10, 2018; abdominal radiograph November 10, 2018 FINDINGS: Nasogastric tube tip and side port in stomach. Contrast is in the colon and rectum. There is currently no bowel dilatation or air-fluid level to suggest bowel obstruction. No free air. There are small phleboliths in the pelvis. IMPRESSION: Contrast present in colon and rectum. No appreciable bowel dilatation or air-fluid level. No  free air. Nasogastric tube tip and side port in stomach. Electronically Signed   By: Bretta Bang III M.D.   On: 11/11/2018 07:51     Subjective: No major events overnight of this morning.  Continues to have regular bowel movement.  Discharge Exam: Vitals:   11/12/18 0605 11/12/18 1356  BP: 114/70 (!) 148/74  Pulse: 75 76  Resp: 18 16  Temp: 98.5 F (36.9 C) 98 F (36.7 C)  SpO2: 97% 99%    GENERAL: Appears well. No acute distress.  HEENT: MMM.  Vision and Hearing grossly intact.  NECK: Supple.  No JVD.  LUNGS:  No IWOB. Good air movement. CTAB.  HEART:  RRR. Heart sounds normal.  ABD: Bowel sounds present. Soft. Non tender.  EXT:  no edema bilaterally.  SKIN: no apparent skin lesion.  NEURO: Awake, alert and oriented appropriately.  No gross deficit.  PSYCH: Calm. Normal affect.    The results of significant diagnostics from this hospitalization (including imaging, microbiology, ancillary and laboratory) are listed below for reference.     Microbiology: Recent Results (from the past 240 hour(s))  Urine culture     Status: None   Collection Time: 11/10/18  8:20 PM  Result Value Ref Range Status   Specimen Description   Final    URINE, RANDOM Performed at [facility], [address], [place], [place] [address]    Special Requests   Final    NONE Performed at [facility], [address], [place], [place] [address]    Culture   Final    NO GROWTH Performed at [facility], [address], [place], [place] [address]    Report Status 11/12/2018 FINAL  Final  Wet prep, genital     Status: None   Collection Time: 11/10/18 10:53 PM  Result Value Ref Range Status   Yeast Wet Prep HPF POC NONE SEEN NONE SEEN Final    Comment: Swab received with less than 0.5 mL of saline, saline added to specimen, interpret results with caution.   Trich, Wet Prep NONE SEEN NONE SEEN Final   Clue Cells Wet Prep HPF POC NONE SEEN NONE  SEEN Final   WBC, Wet Prep HPF POC NONE SEEN NONE SEEN Final   Sperm NONE SEEN  Final    Comment: Performed at [facility], [address], [place], [place] [address]     Labs: BNP (last 3 results) No results for input(s): BNP in the last 8760 hours. Basic Metabolic Panel: Recent Labs  Lab 11/10/18 1403 11/11/18 0443 11/12/18 0511  NA 136 138 138  K 3.7 4.3 3.9  CL 104 110 113*  CO2 24 22 21*  GLUCOSE 103* 91 87  BUN 15 12 10   CREATININE 0.69 0.72 0.52  CALCIUM 8.7* 7.8* 8.1*   Liver Function Tests: Recent Labs  Lab 11/10/18 1403 11/11/18 0443  AST 13* 11*  ALT 13 10  ALKPHOS 69 58  BILITOT 0.9 1.2  PROT 7.1 5.7*  ALBUMIN 4.0 3.0*   Recent Labs  Lab 11/10/18 1403  LIPASE 22   No results for input(s): AMMONIA in the last 168 hours. CBC: Recent Labs  Lab 11/10/18 1403 11/11/18 0443 11/12/18 0511  WBC 16.8* 12.5* 8.9  NEUTROABS  --  9.3*  --   HGB 13.4 11.3* 9.9*  HCT 41.3 35.8* 32.7*  MCV 99.0 100.3* 103.8*  PLT 202 182 183   Cardiac Enzymes: No results for input(s): CKTOTAL, CKMB, CKMBINDEX, TROPONINI in the last 168 hours. BNP: Invalid input(s): POCBNP CBG: No results for input(s): GLUCAP in the last 168 hours. D-Dimer No results for input(s): DDIMER in the last 72 hours. Hgb A1c No results for input(s): HGBA1C in the last 72 hours. Lipid Profile No results for input(s): CHOL, HDL, LDLCALC, TRIG, CHOLHDL, LDLDIRECT in the last 72 hours. Thyroid function studies No results for input(s): TSH, T4TOTAL, T3FREE, THYROIDAB in the last 72 hours.  Invalid input(s): FREET3 Anemia work up No results for input(s): VITAMINB12, FOLATE, FERRITIN, TIBC, IRON, RETICCTPCT in the last 72 hours. Urinalysis    Component Value Date/Time   COLORURINE YELLOW 11/10/2018 2020   APPEARANCEUR CLEAR 11/10/2018 2020   LABSPEC 1.035 (H) 11/10/2018 2020   PHURINE 5.0 11/10/2018 2020   GLUCOSEU NEGATIVE 11/10/2018 2020   HGBUR SMALL (A) 11/10/2018  2020   BILIRUBINUR NEGATIVE 11/10/2018 2020   KETONESUR 20 (A) 11/10/2018 2020   PROTEINUR NEGATIVE 11/10/2018 2020   UROBILINOGEN 0.2 08/08/2013 1430   NITRITE NEGATIVE 11/10/2018 2020   LEUKOCYTESUR NEGATIVE 11/10/2018 2020   Sepsis Labs Invalid input(s): PROCALCITONIN,  WBC,  LACTICIDVEN   Time coordinating discharge: 25 minutes  SIGNED:  Almon Hercules, MD  [facility] 11/12/2018, 5:01 PM Pager [phone]  If 7PM-7AM, please contact night-coverage [email or web address] Password [email or web address]

## 2018-11-12 NOTE — Discharge Instructions (Signed)
Ventral Hernia    A ventral hernia is a bulge of tissue from inside the abdomen that pushes through a weak area of the muscles that form the front wall of the abdomen. The tissues inside the abdomen are inside a sac (peritoneum). These tissues include the small intestine, large intestine, and the fatty tissue that covers the intestines (omentum). Sometimes, the bulge that forms a hernia contains intestines. Other hernias contain only fat. Ventral hernias do not go away without surgical treatment.  There are several types of ventral hernias. You may have:   A hernia at an incision site from previous abdominal surgery (incisional hernia).   A hernia just above the belly button (epigastric hernia), or at the belly button (umbilical hernia). These types of hernias can develop from heavy lifting or straining.   A hernia that comes and goes (reducible hernia). It may be visible only when you lift or strain. This type of hernia can be pushed back into the abdomen (reduced).   A hernia that traps abdominal tissue inside the hernia (incarcerated hernia). This type of hernia does not reduce.   A hernia that cuts off blood flow to the tissues inside the hernia (strangulated hernia). The tissues can start to die if this happens. This is a very painful bulge that cannot be reduced. A strangulated hernia is a medical emergency.  What are the causes?  This condition is caused by abdominal tissue putting pressure on an area of weakness in the abdominal muscles.  What increases the risk?  The following factors may make you more likely to develop this condition:   Being female.   Being 60 or older.   Being overweight or obese.   Having had previous abdominal surgery, especially if there was an infection after surgery.   Having had an injury to the abdominal wall.   Having had several pregnancies.   Having a buildup of fluid inside the abdomen (ascites).  What are the signs or symptoms?  The only symptom of a ventral hernia  may be a painless bulge in the abdomen. A reducible hernia may be visible only when you strain, cough, or lift. Other symptoms may include:   Dull pain.   A feeling of pressure.  Signs and symptoms of a strangulated hernia may include:   Increasing pain.   Nausea and vomiting.   Pain when pressing on the hernia.   The skin over the hernia turning red or purple.   Constipation.   Blood in the stool (feces).  How is this diagnosed?  This condition may be diagnosed based on:   Your symptoms.   Your medical history.   A physical exam. You may be asked to cough or strain while standing. These actions increase the pressure inside your abdomen and force the hernia through the opening in your muscles. Your health care provider may try to reduce the hernia by pressing on it.   Imaging studies, such as an ultrasound or CT scan.  How is this treated?  This condition is treated with surgery. If you have a strangulated hernia, surgery is done as soon as possible. If your hernia is small and not incarcerated, you may be asked to lose some weight before surgery.  Follow these instructions at home:   Follow instructions from your health care provider about eating or drinking restrictions.   If you are overweight, your health care provider may recommend that you increase your activity level and eat a healthier diet.     Do not lift anything that is heavier than 10 lb (4.5 kg).   Return to your normal activities as told by your health care provider. Ask your health care provider what activities are safe for you. You may need to avoid activities that increase pressure on your hernia.   Take over-the-counter and prescription medicines only as told by your health care provider.   Keep all follow-up visits as told by your health care provider. This is important.  Contact a health care provider if:   Your hernia gets larger.   Your hernia becomes painful.  Get help right away if:   Your hernia becomes increasingly  painful.   You have pain along with any of the following:  ? Changes in skin color in the area of the hernia.  ? Nausea.  ? Vomiting.  ? Fever.  Summary   A ventral hernia is a bulge of tissue from inside the abdomen that pushes through a weak area of the muscles that form the front wall of the abdomen.   This condition is treated with surgery, which may be urgent depending on your hernia.   Do not lift anything that is heavier than 10 lb (4.5 kg), and follow activity instructions from your health care provider.  This information is not intended to replace advice given to you by your health care provider. Make sure you discuss any questions you have with your health care provider.  Document Released: 08/26/2012 Document Revised: 10/22/2017 Document Reviewed: 03/31/2017  Elsevier Interactive Patient Education  2019 [employer]

## 2018-11-12 NOTE — Progress Notes (Signed)
Assessment unchanged. Pt verbalized understanding of dc instructions through teach back including follow up care, when to call the doctor, as well as medications. No scripts at dc. Discharged via foot per request to front entrance to meet UBER. Accompanied by NT.

## 2018-11-12 NOTE — Progress Notes (Signed)
[facility] Progress Note     Subjective: CC: abdominal pain Patient still having some abdominal pain but reports this is improving. She is having diarrhea but feels like stools are firming up slightly. Denies nausea. Tolerating liquids. Wants to go home.   Objective: Vital signs in last 24 hours: Temp:  [98.5 F (36.9 C)-98.8 F (37.1 C)] 98.5 F (36.9 C) (02/20 0605) Pulse Rate:  [74-80] 75 (02/20 0605) Resp:  [17-20] 18 (02/20 0605) BP: (98-114)/(60-70) 114/70 (02/20 0605) SpO2:  [97 %-99 %] 97 % (02/20 0605) Weight:  [85.8 kg] 85.8 kg (02/20 0500) Last BM Date: 11/11/18  Intake/Output from previous day: 02/19 0701 - 02/20 0700 In: 3955.2 [P.O.:600; I.V.:3139.3; IV Piggyback:215.8] Out: 125 [Emesis/NG output:125] Intake/Output this shift: No intake/output data recorded.  PE: Gen:  Alert, NAD, pleasant Card:  Regular rate and rhythm Pulm:  Normal effort, clear to auscultation bilaterally Abd: Soft, minimally TTP in lower abdomen, non-distended, ventral hernia without incarceration, +BS Skin: warm and dry, no rashes  Psych: A&Ox3   Lab Results:  Recent Labs    11/11/18 0443 11/12/18 0511  WBC 12.5* 8.9  HGB 11.3* 9.9*  HCT 35.8* 32.7*  PLT 182 183   BMET Recent Labs    11/11/18 0443 11/12/18 0511  NA 138 138  K 4.3 3.9  CL 110 113*  CO2 22 21*  GLUCOSE 91 87  BUN 12 10  CREATININE 0.72 0.52  CALCIUM 7.8* 8.1*   PT/INR Recent Labs    11/10/18 2030  LABPROT 14.8  INR 1.17   CMP     Component Value Date/Time   NA 138 11/12/2018 0511   K 3.9 11/12/2018 0511   CL 113 (H) 11/12/2018 0511   CO2 21 (L) 11/12/2018 0511   GLUCOSE 87 11/12/2018 0511   BUN 10 11/12/2018 0511   CREATININE 0.52 11/12/2018 0511   CALCIUM 8.1 (L) 11/12/2018 0511   PROT 5.7 (L) 11/11/2018 0443   ALBUMIN 3.0 (L) 11/11/2018 0443   AST 11 (L) 11/11/2018 0443   ALT 10 11/11/2018 0443   ALKPHOS 58 11/11/2018 0443   BILITOT 1.2 11/11/2018 0443   GFRNONAA >60  11/12/2018 0511   GFRAA >60 11/12/2018 0511   Lipase     Component Value Date/Time   LIPASE 22 11/10/2018 1403       Studies/Results: Abd 1 View (kub)  Result Date: 11/12/2018 CLINICAL DATA:  Small bowel obstruction. EXAM: ABDOMEN - 1 VIEW COMPARISON:  Radiograph of November 11, 2018. FINDINGS: The bowel gas pattern is normal. Residual contrast is seen throughout the colon. No radio-opaque calculi or other significant radiographic abnormality are seen. IMPRESSION: No evidence of bowel obstruction or ileus. Electronically Signed   By: Lupita RaiderJames  Green Jr, M.D.   On: 11/12/2018 09:11   Dg Abdomen 1 View  Result Date: 11/10/2018 CLINICAL DATA:  53 year old female with bowel obstruction due to herniated transverse colon, NG tube placement. EXAM: ABDOMEN - 1 VIEW COMPARISON:  CT Abdomen and Pelvis earlier today. FINDINGS: Portable AP supine view at 2003 hours. Enteric tube placed with side hole at the level of the gastric body. Stable visible bowel gas pattern. Negative lung bases and mediastinal contours. IMPRESSION: Enteric tube placed into the stomach, side hole the level of the gastric body. Electronically Signed   By: Odessa FlemingH  Hall M.D.   On: 11/10/2018 20:43   Ct Abdomen Pelvis W Contrast  Result Date: 11/10/2018 CLINICAL DATA:  53 year old female with abdominal pain, back pain, nausea, gross hematuria,  urinary frequency, fever. EXAM: CT ABDOMEN AND PELVIS WITH CONTRAST TECHNIQUE: Multidetector CT imaging of the abdomen and pelvis was performed using the standard protocol following bolus administration of intravenous contrast. CONTRAST:  ISOVUE-300 IOPAMIDOL (ISOVUE-300) INJECTION 61% COMPARISON:  CT Abdomen and Pelvis 07/13/2016 end earlier. FINDINGS: Lower chest: Negative. Hepatobiliary: 15 millimeter gallstone in the gallbladder neck. No pericholecystic inflammation. No bile duct enlargement. Negative liver. Pancreas: Negative. Spleen: Negative. Adrenals/Urinary Tract: Normal adrenal glands.  Bilateral renal enhancement and contrast excretion is symmetric and normal. Decompressed proximal ureters. Diminutive and unremarkable urinary bladder. Chronic pelvic phleboliths. Stomach/Bowel: There is dilated small bowel throughout the abdomen to the terminal ileum. The right colon is likewise dilated with fluid to the level of the mid transverse where a 6-8 centimeter segment of the transverse colon is herniated ventrally on series 2, image 40. There are superimposed small caudal fat containing hernias between the herniated transverse colon and the umbilicus. And there is a single small fat herniation cephalad to the bowel hernia. See sagittal image 99. Downstream of this the large bowel is normal and mostly decompressed. The stomach, duodenum and proximal jejunum remain normal. No free air. No free fluid identified. The appendix remains within normal limits on coronal image 50. Vascular/Lymphatic: Calcified aortic atherosclerosis. Sequelae of aortic bypass. The bypass appears patent. Portal venous system is patent. No lymphadenopathy. Reproductive: Ectatic tubular opacity associated with the left adnexa measuring up to 11 millimeters diameter is new since 2017 on series 2, image 77. The uterus and right ovary remain within normal limits. Other: No pelvic free fluid. Stable bilateral inguinal surgical clips. Musculoskeletal: No acute osseous abnormality identified. IMPRESSION: 1. 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 Subsequent moderate to high-grade large and small-bowel obstruction proximally. Multiple small fat containing hernias in the midline both cephalad and caudal to the bowel hernia. 2. New fluid-filled tubular structure at the left ovary since 2017 suspicious for inflammatory Hydrosalpinx. 3. Cholelithiasis without CT evidence of acute cholecystitis. 4.  Aortic Atherosclerosis (ICD10-I70.0).  Patent aortic bypass. Electronically Signed   By: Odessa Fleming M.D.    On: 11/10/2018 19:17   Dg Abd Portable 1v-small Bowel Obstruction Protocol-initial, 8 Hr Delay  Result Date: 11/11/2018 CLINICAL DATA:  Recent bowel obstruction EXAM: PORTABLE ABDOMEN - 1 VIEW COMPARISON:  CT abdomen and pelvis November 10, 2018; abdominal radiograph November 10, 2018 FINDINGS: Nasogastric tube tip and side port in stomach. Contrast is in the colon and rectum. There is currently no bowel dilatation or air-fluid level to suggest bowel obstruction. No free air. There are small phleboliths in the pelvis. IMPRESSION: Contrast present in colon and rectum. No appreciable bowel dilatation or air-fluid level. No free air. Nasogastric tube tip and side port in stomach. Electronically Signed   By: Bretta Bang III M.D.   On: 11/11/2018 07:51    Anti-infectives: Anti-infectives (From admission, onward)   Start     Dose/Rate Route Frequency Ordered Stop   11/11/18 0800  ciprofloxacin (CIPRO) IVPB 400 mg  Status:  Discontinued     400 mg 200 mL/hr over 60 Minutes Intravenous Every 12 hours 11/11/18 0221 11/11/18 1559   11/11/18 0600  metroNIDAZOLE (FLAGYL) IVPB 500 mg  Status:  Discontinued     500 mg 100 mL/hr over 60 Minutes Intravenous Every 8 hours 11/11/18 0221 11/11/18 1559   11/10/18 2100  gentamicin (GARAMYCIN) 500 mg in dextrose 5 % 100 mL IVPB     500 mg 112.5 mL/hr over 60  Minutes Intravenous  Once 11/10/18 2017 11/10/18 2253   11/10/18 2015  ciprofloxacin (CIPRO) IVPB 400 mg     400 mg 200 mL/hr over 60 Minutes Intravenous  Once 11/10/18 2009 11/10/18 2149   11/10/18 2015  metroNIDAZOLE (FLAGYL) IVPB 500 mg     500 mg 100 mL/hr over 60 Minutes Intravenous  Once 11/10/18 2009 11/11/18 0003       Assessment/Plan PVD s/p aortobifemoral bypass fraft Recent UTI L inflammatory hydrosalpinx Tobacco use  Incisional ventral hernias without obstruction - tolerating liquids and having bowel function - advance to soft diet - no indications for surgical intervention  during this admission - recommend discharge later today vs tomorrow AM if tolerating soft diet and outpatient follow up with Dr. Magnus Ivan to discuss elective repair of ventral hernia  FEN: soft diet  VTE: SQ heparin  ID: flagyl/cipro 2/18 > 2/19 Follow up: Dr. Magnus Ivan for elective hernia repairs  LOS: 1 day    Wells Guiles , [facility] 11/12/2018, 10:17 AM Pager: [phone] Consults: [phone]

## 2018-11-18 ENCOUNTER — Other Ambulatory Visit: Payer: Self-pay | Admitting: Surgery

## 2018-12-08 ENCOUNTER — Ambulatory Visit: Payer: Medicaid Other | Admitting: Obstetrics and Gynecology

## 2019-10-23 ENCOUNTER — Other Ambulatory Visit: Payer: Self-pay | Admitting: Family Medicine

## 2019-10-23 DIAGNOSIS — Z1231 Encounter for screening mammogram for malignant neoplasm of breast: Secondary | ICD-10-CM

## 2019-12-14 ENCOUNTER — Other Ambulatory Visit: Payer: Self-pay

## 2019-12-14 ENCOUNTER — Ambulatory Visit
Admission: RE | Admit: 2019-12-14 | Discharge: 2019-12-14 | Disposition: A | Discharge status: Home / Self Care | Payer: Medicaid Other | Source: Ambulatory Visit | Attending: Family Medicine | Admitting: Family Medicine

## 2019-12-14 DIAGNOSIS — Z1231 Encounter for screening mammogram for malignant neoplasm of breast: Secondary | ICD-10-CM

## 2019-12-15 ENCOUNTER — Other Ambulatory Visit: Payer: Self-pay | Admitting: Family Medicine

## 2019-12-15 DIAGNOSIS — R928 Other abnormal and inconclusive findings on diagnostic imaging of breast: Secondary | ICD-10-CM

## 2019-12-31 ENCOUNTER — Ambulatory Visit
Admission: RE | Admit: 2019-12-31 | Discharge: 2019-12-31 | Disposition: A | Discharge status: Home / Self Care | Payer: Medicaid Other | Source: Ambulatory Visit | Attending: Family Medicine | Admitting: Family Medicine

## 2019-12-31 ENCOUNTER — Other Ambulatory Visit: Payer: Self-pay

## 2019-12-31 DIAGNOSIS — R928 Other abnormal and inconclusive findings on diagnostic imaging of breast: Secondary | ICD-10-CM

## 2020-01-07 ENCOUNTER — Other Ambulatory Visit: Payer: Self-pay | Admitting: Surgery

## 2020-01-16 NOTE — Pre-Procedure Instructions (Signed)
Frances Graham  01/16/2020      [facility] - [place], [place] - [address] [place] [address] Phone: [phone] Fax: [phone]    Your procedure is scheduled on April 29  Report to [facility] Entrance A at 6:45 A.M.  Call this number if you have problems the morning of surgery:  [phone]   Remember:  Do not eat  after midnight.   You may drink clear liquids until 5:45 .  Clear liquids allowed are:                    Water, Juice (non-citric and without pulp), Carbonated beverages, Clear Tea, Black Coffee only, Plain Jell-O only, Gatorade and Plain Popsicles only                 Please complete your PRE-SURGERY ENSURE that was provided to you by 5:45 A.M. the morning of surgery.  Please, if able, drink it in one setting. DO NOT SIP.    Take these medicines the morning of surgery with A SIP OF WATER :              Hydrocodone if needed                 Do not wear jewelry, make-up or nail polish.  Do not wear lotions, powders, or perfumes, or deodorant.  Do not shave 48 hours prior to surgery.  Men may shave face and neck.  Do not bring valuables to the hospital.  [facility] is not responsible for any belongings or valuables.  Contacts, dentures or bridgework may not be worn into surgery.  Leave your suitcase in the car.  After surgery it may be brought to your room.  For patients admitted to the hospital, discharge time will be determined by your treatment team.  Patients discharged the day of surgery will not be allowed to drive home.    Special instructions:  [place]- Preparing For Surgery  Before surgery, you can play an important role. Because skin is not sterile, your skin needs to be as free of germs as possible. You can reduce the number of germs on your skin by washing with CHG (chlorahexidine gluconate) Soap before surgery.  CHG is an antiseptic cleaner which kills germs and bonds with the skin to  continue killing germs even after washing.    Oral Hygiene is also important to reduce your risk of infection.  Remember - BRUSH YOUR TEETH THE MORNING OF SURGERY WITH YOUR REGULAR TOOTHPASTE  Please do not use if you have an allergy to CHG or antibacterial soaps. If your skin becomes reddened/irritated stop using the CHG.  Do not shave (including legs and underarms) for at least 48 hours prior to first CHG shower. It is OK to shave your face.  Please follow these instructions carefully.   1. Shower the NIGHT BEFORE SURGERY and the MORNING OF SURGERY with CHG.   2. If you chose to wash your hair, wash your hair first as usual with your normal shampoo.  3. After you shampoo, rinse your hair and body thoroughly to remove the shampoo.  4. Use CHG as you would any other liquid soap. You can apply CHG directly to the skin and wash gently with a scrungie or a clean washcloth.   5. Apply the CHG Soap to your body ONLY FROM THE NECK DOWN.  Do not use on open wounds or  open sores. Avoid contact with your eyes, ears, mouth and genitals (private parts). Wash Face and genitals (private parts)  with your normal soap.  6. Wash thoroughly, paying special attention to the area where your surgery will be performed.  7. Thoroughly rinse your body with warm water from the neck down.  8. DO NOT shower/wash with your normal soap after using and rinsing off the CHG Soap.  9. Pat yourself dry with a CLEAN TOWEL.  10. Wear CLEAN PAJAMAS to bed the night before surgery, wear comfortable clothes the morning of surgery  11. Place CLEAN SHEETS on your bed the night of your first shower and DO NOT SLEEP WITH PETS.    Day of Surgery:  Do not apply any deodorants/lotions.  Please wear clean clothes to the hospital/surgery center.   Remember to brush your teeth WITH YOUR REGULAR TOOTHPASTE.    Please read over the following fact sheets that you were given. Coughing and Deep Breathing and Surgical Site  Infection Prevention

## 2020-01-17 ENCOUNTER — Encounter (HOSPITAL_COMMUNITY): Payer: Self-pay

## 2020-01-17 ENCOUNTER — Other Ambulatory Visit (HOSPITAL_COMMUNITY)
Admission: RE | Admit: 2020-01-17 | Discharge: 2020-01-17 | Disposition: A | Discharge status: Home / Self Care | Payer: Medicaid Other | Source: Ambulatory Visit | Attending: Surgery | Admitting: Surgery

## 2020-01-17 ENCOUNTER — Other Ambulatory Visit: Payer: Self-pay

## 2020-01-17 ENCOUNTER — Encounter (HOSPITAL_COMMUNITY)
Admission: RE | Admit: 2020-01-17 | Discharge: 2020-01-17 | Disposition: A | Discharge status: Home / Self Care | Payer: Medicaid Other | Source: Ambulatory Visit | Attending: Surgery | Admitting: Surgery

## 2020-01-17 DIAGNOSIS — Z20822 Contact with and (suspected) exposure to covid-19: Secondary | ICD-10-CM | POA: Insufficient documentation

## 2020-01-17 DIAGNOSIS — Z01812 Encounter for preprocedural laboratory examination: Secondary | ICD-10-CM | POA: Diagnosis not present

## 2020-01-17 LAB — CBC
HCT: 45 % (ref 36.0–46.0)
Hemoglobin: 14.6 g/dL (ref 12.0–15.0)
MCH: 31.9 pg (ref 26.0–34.0)
MCHC: 32.4 g/dL (ref 30.0–36.0)
MCV: 98.5 fL (ref 80.0–100.0)
Platelets: 267 10*3/uL (ref 150–400)
RBC: 4.57 MIL/uL (ref 3.87–5.11)
RDW: 13.1 % (ref 11.5–15.5)
WBC: 11 10*3/uL — ABNORMAL HIGH (ref 4.0–10.5)
nRBC: 0 % (ref 0.0–0.2)

## 2020-01-17 LAB — BASIC METABOLIC PANEL
Anion gap: 10 (ref 5–15)
BUN: 12 mg/dL (ref 6–20)
CO2: 19 mmol/L — ABNORMAL LOW (ref 22–32)
Calcium: 9 mg/dL (ref 8.9–10.3)
Chloride: 108 mmol/L (ref 98–111)
Creatinine, Ser: 0.65 mg/dL (ref 0.44–1.00)
GFR calc Af Amer: >60 mL/min (ref >60)
GFR calc non Af Amer: >60 mL/min (ref >60)
Glucose, Bld: 93 mg/dL (ref 70–99)
Potassium: 4.4 mmol/L (ref 3.5–5.1)
Sodium: 137 mmol/L (ref 135–145)

## 2020-01-17 LAB — SARS CORONAVIRUS 2 (TAT 6-24 HRS): SARS Coronavirus 2: NEGATIVE

## 2020-01-17 NOTE — Pre-Procedure Instructions (Addendum)
BEXLEE BERGDOLL  01/17/2020      [facility] - [place], [place] - [address] [place] [address] Phone: [phone] Fax: [phone]    Your procedure is scheduled on Thursday April 29th  Report to [facility] Entrance A at 6:45 A.M.  Call this number if you have problems the morning of surgery:  [phone]   Remember:  Do not eat  after midnight.   You may drink clear liquids until 5:45 .  Clear liquids allowed are:                    Water, Juice (non-citric and without pulp), Carbonated beverages, Clear Tea, Black Coffee only, Plain Jell-O only, Gatorade and Plain Popsicles only                 Patient Instructions  . The night before surgery:  o No food after midnight. ONLY clear liquids after midnight  .  Marland Kitchen The day of surgery (if you do NOT have diabetes):  o Drink ONE (1) Pre-Surgery Clear Ensure as directed.   o This drink was given to you during your hospital  pre-op appointment visit. o The pre-op nurse will instruct you on the time to drink the  Pre-Surgery Ensure depending on your surgery time. o Finish the drink at the designated time by the pre-op nurse.-5:45 AM  o Nothing else to drink after completing the  Pre-Surgery Clear Ensure.         If you have questions, please contact your surgeon's office.     Take these medicines the morning of surgery with A SIP OF WATER :              Hydrocodone if needed                 Do not wear jewelry, make-up or nail polish.  Do not wear lotions, powders, or perfumes, or deodorant.  Do not shave 48 hours prior to surgery.  Men may shave face and neck.  Do not bring valuables to the hospital.  [facility] is not responsible for any belongings or valuables.  Contacts, dentures or bridgework may not be worn into surgery.  Leave your suitcase in the car.  After surgery it may be brought to your room.  For patients admitted to the hospital, discharge time will be  determined by your treatment team.  Patients discharged the day of surgery will not be allowed to drive home.    Special instructions:  [place]- Preparing For Surgery  Before surgery, you can play an important role. Because skin is not sterile, your skin needs to be as free of germs as possible. You can reduce the number of germs on your skin by washing with CHG (chlorahexidine gluconate) Soap before surgery.  CHG is an antiseptic cleaner which kills germs and bonds with the skin to continue killing germs even after washing.    Oral Hygiene is also important to reduce your risk of infection.  Remember - BRUSH YOUR TEETH THE MORNING OF SURGERY WITH YOUR REGULAR TOOTHPASTE  Please do not use if you have an allergy to CHG or antibacterial soaps. If your skin becomes reddened/irritated stop using the CHG.  Do not shave (including legs and underarms) for at least 48 hours prior to first CHG shower. It is OK to shave your face.  Please follow these instructions carefully.   1. Shower the NIGHT BEFORE  SURGERY and the MORNING OF SURGERY with CHG.   2. If you chose to wash your hair, wash your hair first as usual with your normal shampoo.  3. After you shampoo, rinse your hair and body thoroughly to remove the shampoo.  4. Use CHG as you would any other liquid soap. You can apply CHG directly to the skin and wash gently with a scrungie or a clean washcloth.   5. Apply the CHG Soap to your body ONLY FROM THE NECK DOWN.  Do not use on open wounds or open sores. Avoid contact with your eyes, ears, mouth and genitals (private parts). Wash Face and genitals (private parts)  with your normal soap.  6. Wash thoroughly, paying special attention to the area where your surgery will be performed.  7. Thoroughly rinse your body with warm water from the neck down.  8. DO NOT shower/wash with your normal soap after using and rinsing off the CHG Soap.  9. Pat yourself dry with a CLEAN TOWEL.  10. Wear  CLEAN PAJAMAS to bed the night before surgery, wear comfortable clothes the morning of surgery  11. Place CLEAN SHEETS on your bed the night of your first shower and DO NOT SLEEP WITH PETS.    Day of Surgery:  Do not apply any deodorants/lotions.  Please wear clean clothes to the hospital/surgery center.   Remember to brush your teeth WITH YOUR REGULAR TOOTHPASTE.

## 2020-01-17 NOTE — Progress Notes (Signed)
PCP - Dr. Windle Guard Cardiologist - denies   Chest x-ray - N/A EKG - N/A Stress Test - denies ECHO - 2014 Cardiac Cath - denies   Sleep Study - denies  Aspirin Instructions: Patient instructed to hold all Aspirin, NSAID's, herbal medications, fish oil and vitamins 7 days prior to surgery.   ERAS Protcol - clear liquids/Ensure PRE-SURGERY Ensure or G2- Ensure  COVID TEST- 01/17/20 at [facility]. Pt instructed to remain in their car. Educated on Haematologist until [employer]    Anesthesia review:   Patient denies shortness of breath, fever, cough and chest pain at PAT appointment   All instructions explained to the patient, with a verbal understanding of the material. Patient agrees to go over the instructions while at home for a better understanding. Patient also instructed to self quarantine after being tested for COVID-19. The opportunity to ask questions was provided.

## 2020-01-19 NOTE — H&P (Signed)
Eulah Pont  Location: [facility] Surgery Patient #: [id number] DOB: 03-23-1966 Single / Language: [place] / Race: White Female   History of Present Illness   The patient is a 54 year old female who presents with an incisional hernia. She is here for a follow-up from her recent hospitalization. She presented  to [facility] long with an incarcerated incisional hernia containing colon. This was able to be reduced and relieved for bowel obstruction. She was discharged by the hospitalist for outpatient follow-up as her symptoms completely resolved. This CT scan showing the hernia also showed what appeared to be potential inflammatory hydrosalpinx in the left lower quadrant. This was new from a CT scan in 2017. Today, the patient reports she has no obstructive symptoms and is eating well. All of her discomfort is in the left pelvis. She has some nausea. She has no local gynecologist.   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 Knee Surgery  Left. Oral Surgery   Diagnostic Studies History Colonoscopy  never Mammogram  never Pap Smear  1-5 years ago  Allergies  No Known Allergies  [11/18/2018]: No Known Drug Allergies  [11/18/2018]: Allergies Reconciled   Medication History Methylphenidate HCl (20MG  Tablet, Oral) Active. Clindamycin HCl (300MG  Capsule, Oral) Active. Nystatin (100000 UNIT/ML Suspension, Mouth/Throat) Active. Medications Reconciled  Social History Alcohol use  Occasional alcohol use. Caffeine use  Coffee. No drug use  Tobacco use  Current every day smoker.  Family History Arthritis  Mother. Cancer  Mother. Depression  Mother. Heart Disease  Mother. Heart disease in female family member before age 33  Hypertension  Mother. Respiratory Condition  Mother.  Pregnancy / Birth History  Age at menarche  11 years. Gravida  4 Irregular periods  Maternal age  34-30 Para   2  Other Problems  Anxiety Disorder  Arthritis  Back Pain  Cholelithiasis  High blood pressure  Kidney Stone  Migraine Headache  Vascular Disease  Ventral Hernia Repair     Review of Systems  General Not Present- Appetite Loss, Chills, Fatigue, Fever, Night Sweats, Weight Gain and Weight Loss. Skin Not Present- Change in Wart/Mole, Dryness, Hives, Jaundice, New Lesions, Non-Healing Wounds, Rash and Ulcer. HEENT Not Present- Earache, Hearing Loss, Hoarseness, Nose Bleed, Oral Ulcers, Ringing in the Ears, Seasonal Allergies, Sinus Pain, Sore Throat, Visual Disturbances, Wears glasses/contact lenses and Yellow Eyes. Respiratory Not Present- Bloody sputum, Chronic Cough, Difficulty Breathing, Snoring and Wheezing. Cardiovascular Not Present- Chest Pain, Difficulty Breathing Lying Down, Leg Cramps, Palpitations, Rapid Heart Rate, Shortness of Breath and Swelling of Extremities. Gastrointestinal Present- Abdominal Pain and Nausea. Not Present- Bloating, Bloody Stool, Change in Bowel Habits, Chronic diarrhea, Constipation, Difficulty Swallowing, Excessive gas, Gets full quickly at meals, Hemorrhoids, Indigestion, Rectal Pain and Vomiting. Female Genitourinary Present- Frequency. Not Present- Nocturia, Painful Urination, Pelvic Pain and Urgency. Musculoskeletal Not Present- Back Pain, Joint Pain, Joint Stiffness, Muscle Pain, Muscle Weakness and Swelling of Extremities. Neurological Not Present- Decreased Memory, Fainting, Headaches, Numbness, Seizures, Tingling, Tremor, Trouble walking and Weakness. Psychiatric Not Present- Anxiety, Bipolar, Change in Sleep Pattern, Depression, Fearful and Frequent crying. Endocrine Not Present- Cold Intolerance, Excessive Hunger, Hair Changes, Heat Intolerance, Hot flashes and New Diabetes. Hematology Not Present- Blood Thinners, Easy Bruising, Excessive bleeding, Gland problems, HIV and Persistent Infections.  Vitals 11/18/2018 2:25 PM Weight: 184.4  lb Height: 103in Body Surface Area: 2.67 m Body Mass Index: 12.22 kg/m  Temp.: 96.21F (Temporal)  Pulse: 107 (Regular)  P.OX:  97% (Room air) BP: 138/68(Sitting, Left Arm, Standard)       Physical Exam  The physical exam findings are as follows: Note: She appears well and exam. Her abdomen is soft. Her hernia remains reduced. There is minimal tenderness in the left lower quadrant  I again reviewed the CT scan that showed the original incarcerated midline incisional hernia containing colon. There were other smaller hernias along the midline from her previous surgery containing fat. Again, there was a possible inflammatory hydrosalpinx in the left adnexa new from a CT scan in 2017.    Assessment & Plan   INCISIONAL HERNIA (K43.2)  Impression: We again discussed her hernia diagnosis. Repair of the hernia with mesh was recommended. She needs to be seen by a gynecologist preoperatively for further evaluation of the inflammatory hydrosalpinx and whether anything if all needs to be done prior to me proceeding with either an open or laparoscopic incisional hernia repair with mesh. I will see her back after seen by the specialist and will discuss things further.  Addendum: INCISIONAL HERNIA (K43.2) Impression: I again discussed the diagnosis of incisional hernia with her. We discussed repair of hernias of resistance to do this. We discussed the risk of recurrent incarceration of bowel function as she has had the past. We then discussed both the laparoscopic and open techniques of hernia repair with use of mesh. She likely proceed with a laparoscopic repair. I discussed the risk which includes but is not limited to bleeding, infection, injury to surrounding structures, the need to convert to an open procedure, the use of mesh, the risk of hernia recurrence, cardiopulmonary issues, etc. She does a lot of heavy lifting at work and will always be at risk of a recurrent hernia despite  repair. She also has obesity. Her previous abdominal surgery was an aortobifem bypass. After discussion, she was to proceed with surgery which will be scheduled.

## 2020-01-20 ENCOUNTER — Other Ambulatory Visit: Payer: Self-pay

## 2020-01-20 ENCOUNTER — Ambulatory Visit (HOSPITAL_COMMUNITY): Payer: Medicaid Other | Admitting: Anesthesiology

## 2020-01-20 ENCOUNTER — Encounter (HOSPITAL_COMMUNITY)
Admission: RE | Disposition: A | Discharge status: Home / Self Care | Payer: Self-pay | Source: Home / Self Care | Attending: Surgery

## 2020-01-20 ENCOUNTER — Inpatient Hospital Stay (HOSPITAL_COMMUNITY)
Admission: RE | Admit: 2020-01-20 | Discharge: 2020-01-22 | DRG: 355 | Disposition: A | Discharge status: Home / Self Care | Payer: Medicaid Other | Attending: Surgery | Admitting: Surgery

## 2020-01-20 ENCOUNTER — Encounter (HOSPITAL_COMMUNITY): Payer: Self-pay | Admitting: Surgery

## 2020-01-20 DIAGNOSIS — Z8261 Family history of arthritis: Secondary | ICD-10-CM

## 2020-01-20 DIAGNOSIS — Z79899 Other long term (current) drug therapy: Secondary | ICD-10-CM

## 2020-01-20 DIAGNOSIS — E669 Obesity, unspecified: Secondary | ICD-10-CM | POA: Diagnosis present

## 2020-01-20 DIAGNOSIS — K430 Incisional hernia with obstruction, without gangrene: Principal | ICD-10-CM | POA: Diagnosis present

## 2020-01-20 DIAGNOSIS — K660 Peritoneal adhesions (postprocedural) (postinfection): Secondary | ICD-10-CM | POA: Diagnosis present

## 2020-01-20 DIAGNOSIS — Z818 Family history of other mental and behavioral disorders: Secondary | ICD-10-CM

## 2020-01-20 DIAGNOSIS — Z20822 Contact with and (suspected) exposure to covid-19: Secondary | ICD-10-CM | POA: Diagnosis present

## 2020-01-20 DIAGNOSIS — K432 Incisional hernia without obstruction or gangrene: Secondary | ICD-10-CM | POA: Diagnosis present

## 2020-01-20 DIAGNOSIS — Z8249 Family history of ischemic heart disease and other diseases of the circulatory system: Secondary | ICD-10-CM

## 2020-01-20 HISTORY — PX: INCISIONAL HERNIA REPAIR: SHX193

## 2020-01-20 LAB — POCT PREGNANCY, URINE: Preg Test, Ur: NEGATIVE

## 2020-01-20 SURGERY — REPAIR, HERNIA, INCISIONAL, LAPAROSCOPIC
Anesthesia: General

## 2020-01-20 MED ORDER — METHOCARBAMOL 500 MG PO TABS
500.0000 mg | ORAL_TABLET | Freq: Four times a day (QID) | ORAL | Status: DC | PRN
  Administered 2020-01-21: 500 mg via ORAL
  Filled 2020-01-20: qty 1

## 2020-01-20 MED ORDER — ROCURONIUM BROMIDE 10 MG/ML (PF) SYRINGE
PREFILLED_SYRINGE | INTRAVENOUS | Status: DC | PRN
  Administered 2020-01-20: 20 mg via INTRAVENOUS
  Administered 2020-01-20: 50 mg via INTRAVENOUS

## 2020-01-20 MED ORDER — PROPOFOL 10 MG/ML IV BOLUS
INTRAVENOUS | Status: AC
  Filled 2020-01-20: qty 20

## 2020-01-20 MED ORDER — ACETAMINOPHEN 325 MG PO TABS: 650.0000 mg | ORAL_TABLET | Freq: Four times a day (QID) | ORAL | Status: DC | PRN

## 2020-01-20 MED ORDER — DIPHENHYDRAMINE HCL 50 MG/ML IJ SOLN: 25.0000 mg | Freq: Four times a day (QID) | INTRAMUSCULAR | Status: DC | PRN

## 2020-01-20 MED ORDER — POTASSIUM CHLORIDE IN NACL 20-0.9 MEQ/L-% IV SOLN
INTRAVENOUS | Status: DC
  Filled 2020-01-20 (×2): qty 1000

## 2020-01-20 MED ORDER — HYDROCODONE-ACETAMINOPHEN 5-325 MG PO TABS
1.0000 | ORAL_TABLET | ORAL | Status: DC | PRN
  Administered 2020-01-20 – 2020-01-22 (×8): 2 via ORAL
  Filled 2020-01-20 (×9): qty 2

## 2020-01-20 MED ORDER — HYDROMORPHONE HCL 1 MG/ML IJ SOLN
INTRAMUSCULAR | Status: AC
  Filled 2020-01-20: qty 1

## 2020-01-20 MED ORDER — DEXAMETHASONE SODIUM PHOSPHATE 10 MG/ML IJ SOLN
INTRAMUSCULAR | Status: AC
  Filled 2020-01-20: qty 1

## 2020-01-20 MED ORDER — ACETAMINOPHEN 650 MG RE SUPP: 650.0000 mg | Freq: Four times a day (QID) | RECTAL | Status: DC | PRN

## 2020-01-20 MED ORDER — ENSURE PRE-SURGERY PO LIQD: 296.0000 mL | Freq: Once | ORAL | Status: DC

## 2020-01-20 MED ORDER — ACETAMINOPHEN 500 MG PO TABS
1000.0000 mg | ORAL_TABLET | ORAL | Status: AC
  Administered 2020-01-20: 1000 mg via ORAL
  Filled 2020-01-20: qty 2

## 2020-01-20 MED ORDER — HYDROMORPHONE HCL 1 MG/ML IJ SOLN
1.0000 mg | INTRAMUSCULAR | Status: DC | PRN
  Administered 2020-01-20 – 2020-01-22 (×7): 1 mg via INTRAVENOUS
  Filled 2020-01-20 (×8): qty 1

## 2020-01-20 MED ORDER — GABAPENTIN 300 MG PO CAPS
300.0000 mg | ORAL_CAPSULE | Freq: Two times a day (BID) | ORAL | Status: DC
  Administered 2020-01-20 – 2020-01-22 (×5): 300 mg via ORAL
  Filled 2020-01-20 (×5): qty 1

## 2020-01-20 MED ORDER — LIDOCAINE 2% (20 MG/ML) 5 ML SYRINGE
INTRAMUSCULAR | Status: AC
  Filled 2020-01-20: qty 5

## 2020-01-20 MED ORDER — ONDANSETRON HCL 4 MG/2ML IJ SOLN
INTRAMUSCULAR | Status: AC
  Filled 2020-01-20: qty 2

## 2020-01-20 MED ORDER — SUGAMMADEX SODIUM 200 MG/2ML IV SOLN
INTRAVENOUS | Status: DC | PRN
  Administered 2020-01-20: 230 mg via INTRAVENOUS

## 2020-01-20 MED ORDER — FENTANYL CITRATE (PF) 250 MCG/5ML IJ SOLN
INTRAMUSCULAR | Status: AC
  Filled 2020-01-20: qty 5

## 2020-01-20 MED ORDER — ONDANSETRON 4 MG PO TBDP: 4.0000 mg | ORAL_TABLET | Freq: Four times a day (QID) | ORAL | Status: DC | PRN

## 2020-01-20 MED ORDER — HYDROMORPHONE HCL 1 MG/ML IJ SOLN
0.2500 mg | INTRAMUSCULAR | Status: DC | PRN
  Administered 2020-01-20 (×4): 0.5 mg via INTRAVENOUS

## 2020-01-20 MED ORDER — MIDAZOLAM HCL 2 MG/2ML IJ SOLN
INTRAMUSCULAR | Status: DC | PRN
  Administered 2020-01-20: 2 mg via INTRAVENOUS

## 2020-01-20 MED ORDER — LACTATED RINGERS IV SOLN: INTRAVENOUS | Status: DC

## 2020-01-20 MED ORDER — CIPROFLOXACIN IN D5W 400 MG/200ML IV SOLN
400.0000 mg | INTRAVENOUS | Status: AC
  Administered 2020-01-20: 400 mg via INTRAVENOUS
  Filled 2020-01-20: qty 200

## 2020-01-20 MED ORDER — LIDOCAINE HCL (CARDIAC) PF 100 MG/5ML IV SOSY
PREFILLED_SYRINGE | INTRAVENOUS | Status: DC | PRN
  Administered 2020-01-20: 100 mg via INTRATRACHEAL

## 2020-01-20 MED ORDER — CHLORHEXIDINE GLUCONATE CLOTH 2 % EX PADS: 6.0000 | MEDICATED_PAD | Freq: Once | CUTANEOUS | Status: DC

## 2020-01-20 MED ORDER — MEPERIDINE HCL 25 MG/ML IJ SOLN: 6.2500 mg | INTRAMUSCULAR | Status: DC | PRN

## 2020-01-20 MED ORDER — PROPOFOL 10 MG/ML IV BOLUS
INTRAVENOUS | Status: DC | PRN
  Administered 2020-01-20: 160 mg via INTRAVENOUS
  Administered 2020-01-20: 40 mg via INTRAVENOUS

## 2020-01-20 MED ORDER — TRAMADOL HCL 50 MG PO TABS: 50.0000 mg | ORAL_TABLET | Freq: Four times a day (QID) | ORAL | Status: DC | PRN

## 2020-01-20 MED ORDER — BUPIVACAINE HCL (PF) 0.25 % IJ SOLN
INTRAMUSCULAR | Status: AC
  Filled 2020-01-20: qty 30

## 2020-01-20 MED ORDER — DIPHENHYDRAMINE HCL 25 MG PO CAPS: 25.0000 mg | ORAL_CAPSULE | Freq: Four times a day (QID) | ORAL | Status: DC | PRN

## 2020-01-20 MED ORDER — DEXAMETHASONE SODIUM PHOSPHATE 10 MG/ML IJ SOLN
INTRAMUSCULAR | Status: DC | PRN
Start: 2020-01-20 — End: 2020-01-20
  Administered 2020-01-20: 8 mg via INTRAVENOUS

## 2020-01-20 MED ORDER — ONDANSETRON HCL 4 MG/2ML IJ SOLN: 4.0000 mg | Freq: Four times a day (QID) | INTRAMUSCULAR | Status: DC | PRN

## 2020-01-20 MED ORDER — FENTANYL CITRATE (PF) 100 MCG/2ML IJ SOLN
INTRAMUSCULAR | Status: DC | PRN
  Administered 2020-01-20: 100 ug via INTRAVENOUS
  Administered 2020-01-20 (×2): 50 ug via INTRAVENOUS
  Administered 2020-01-20: 150 ug via INTRAVENOUS
  Administered 2020-01-20: 50 ug via INTRAVENOUS

## 2020-01-20 MED ORDER — ONDANSETRON HCL 4 MG/2ML IJ SOLN
INTRAMUSCULAR | Status: DC | PRN
  Administered 2020-01-20: 4 mg via INTRAVENOUS

## 2020-01-20 MED ORDER — BUPIVACAINE-EPINEPHRINE (PF) 0.5% -1:200000 IJ SOLN
INTRAMUSCULAR | Status: DC | PRN
  Administered 2020-01-20: 30 mL

## 2020-01-20 MED ORDER — AMPHETAMINE-DEXTROAMPHETAMINE 10 MG PO TABS: 20.0000 mg | ORAL_TABLET | Freq: Every day | ORAL | Status: DC | PRN

## 2020-01-20 MED ORDER — GABAPENTIN 300 MG PO CAPS
300.0000 mg | ORAL_CAPSULE | ORAL | Status: AC
  Administered 2020-01-20: 300 mg via ORAL
  Filled 2020-01-20: qty 1

## 2020-01-20 MED ORDER — ENOXAPARIN SODIUM 40 MG/0.4ML ~~LOC~~ SOLN
40.0000 mg | SUBCUTANEOUS | Status: DC
  Filled 2020-01-20 (×2): qty 0.4

## 2020-01-20 MED ORDER — PROMETHAZINE HCL 25 MG/ML IJ SOLN: 6.2500 mg | INTRAMUSCULAR | Status: DC | PRN

## 2020-01-20 MED ORDER — BUPIVACAINE-EPINEPHRINE 0.5% -1:200000 IJ SOLN
INTRAMUSCULAR | Status: AC
  Filled 2020-01-20: qty 1

## 2020-01-20 MED ORDER — MIDAZOLAM HCL 2 MG/2ML IJ SOLN
INTRAMUSCULAR | Status: AC
  Filled 2020-01-20: qty 2

## 2020-01-20 MED ORDER — PHENYLEPHRINE 40 MCG/ML (10ML) SYRINGE FOR IV PUSH (FOR BLOOD PRESSURE SUPPORT)
PREFILLED_SYRINGE | INTRAVENOUS | Status: DC | PRN
  Administered 2020-01-20: 40 ug via INTRAVENOUS

## 2020-01-20 SURGICAL SUPPLY — 41 items
ADH SKN CLS APL DERMABOND .7 (GAUZE/BANDAGES/DRESSINGS)
APL PRP STRL LF DISP 70% ISPRP (MISCELLANEOUS) ×1
APPLIER CLIP ROT 10 11.4 M/L (STAPLE)
APR CLP MED LRG 11.4X10 (STAPLE)
CHLORAPREP W/TINT 26 (MISCELLANEOUS) ×3 IMPLANT
CLIP APPLIE ROT 10 11.4 M/L (STAPLE) IMPLANT
COVER SURGICAL LIGHT HANDLE (MISCELLANEOUS) ×3 IMPLANT
DECANTER SPIKE VIAL GLASS SM (MISCELLANEOUS) ×3 IMPLANT
DERMABOND ADVANCED (GAUZE/BANDAGES/DRESSINGS)
DERMABOND ADVANCED .7 DNX12 (GAUZE/BANDAGES/DRESSINGS) ×1 IMPLANT
DEVICE SECURE STRAP 25 ABSORB (INSTRUMENTS) ×3 IMPLANT
DEVICE TROCAR PUNCTURE CLOSURE (ENDOMECHANICALS) ×3 IMPLANT
ELECT REM PT RETURN 9FT ADLT (ELECTROSURGICAL) ×3
ELECTRODE REM PT RTRN 9FT ADLT (ELECTROSURGICAL) ×1 IMPLANT
GLOVE SURG SIGNA 7.5 PF LTX (GLOVE) ×3 IMPLANT
GOWN STRL REUS W/ TWL LRG LVL3 (GOWN DISPOSABLE) ×2 IMPLANT
GOWN STRL REUS W/ TWL XL LVL3 (GOWN DISPOSABLE) ×1 IMPLANT
GOWN STRL REUS W/TWL LRG LVL3 (GOWN DISPOSABLE) ×6
GOWN STRL REUS W/TWL XL LVL3 (GOWN DISPOSABLE) ×3
KIT BASIN OR (CUSTOM PROCEDURE TRAY) ×3 IMPLANT
KIT TURNOVER KIT B (KITS) ×3 IMPLANT
MARKER SKIN DUAL TIP RULER LAB (MISCELLANEOUS) ×3 IMPLANT
MESH VENTRALIGHT ST 8X10 (Mesh General) ×2 IMPLANT
NDL SPNL 22GX3.5 QUINCKE BK (NEEDLE) ×1 IMPLANT
NEEDLE SPNL 22GX3.5 QUINCKE BK (NEEDLE) ×3 IMPLANT
NS IRRIG 1000ML POUR BTL (IV SOLUTION) ×3 IMPLANT
SCISSORS LAP 5X35 DISP (ENDOMECHANICALS) ×2 IMPLANT
SET IRRIG TUBING LAPAROSCOPIC (IRRIGATION / IRRIGATOR) IMPLANT
SET TUBE SMOKE EVAC HIGH FLOW (TUBING) ×3 IMPLANT
SHEARS HARMONIC ACE PLUS 36CM (ENDOMECHANICALS) ×2 IMPLANT
SLEEVE ENDOPATH XCEL 5M (ENDOMECHANICALS) ×5 IMPLANT
SUT MNCRL AB 4-0 PS2 18 (SUTURE) ×2 IMPLANT
SUT MON AB 4-0 PC3 18 (SUTURE) ×3 IMPLANT
SUT NOVA NAB GS-21 0 18 T12 DT (SUTURE) ×3 IMPLANT
TOWEL GREEN STERILE (TOWEL DISPOSABLE) ×3 IMPLANT
TOWEL GREEN STERILE FF (TOWEL DISPOSABLE) ×3 IMPLANT
TRAY FOLEY W/BAG SLVR 16FR (SET/KITS/TRAYS/PACK) ×3
TRAY FOLEY W/BAG SLVR 16FR ST (SET/KITS/TRAYS/PACK) IMPLANT
TRAY LAPAROSCOPIC MC (CUSTOM PROCEDURE TRAY) ×3 IMPLANT
TROCAR XCEL NON-BLD 11X100MML (ENDOMECHANICALS) ×3 IMPLANT
TROCAR XCEL NON-BLD 5MMX100MML (ENDOMECHANICALS) ×3 IMPLANT

## 2020-01-20 NOTE — Progress Notes (Signed)
Patient arrived to 6N12, AOx4, VSS, c/o pain 8/10 will give prn pain meds,  Assessed surgical incisions with dermabond, clean, dry and intact. Oriented to room, use of call bell, bed controls, informed about visitation policy. Will continue to monitor

## 2020-01-20 NOTE — Anesthesia Procedure Notes (Signed)
Procedure Name: Intubation Date/Time: 01/20/2020 8:48 AM Performed by: Leonor Liv, CRNA Pre-anesthesia Checklist: Patient identified, Emergency Drugs available, Suction available and Patient being monitored Patient Re-evaluated:Patient Re-evaluated prior to induction Oxygen Delivery Method: Circle System Utilized Preoxygenation: Pre-oxygenation with 100% oxygen Induction Type: IV induction Ventilation: Mask ventilation without difficulty Laryngoscope Size: Mac and 3 Grade View: Grade I Tube type: Oral Tube size: 7.0 mm Number of attempts: 1 Airway Equipment and Method: Stylet and Oral airway Placement Confirmation: ETT inserted through vocal cords under direct vision,  positive ETCO2 and breath sounds checked- equal and bilateral Secured at: 21 cm Tube secured with: Tape Dental Injury: Teeth and Oropharynx as per pre-operative assessment

## 2020-01-20 NOTE — Op Note (Signed)
LAPAROSCOPIC INCISIONAL HERNIA REPAIR WITH MESH  Procedure Note  TALER KUSHNER 01/20/2020   Pre-op Diagnosis: INCISIONAL HERNIA     Post-op Diagnosis: same  Procedure(s): LAPAROSCOPIC INCISIONAL HERNIA REPAIR WITH MESH (20 cm x 25 cm)  Surgeon(s): Abigail Miyamoto, MD  Anesthesia: General  Staff:  Circulator: Hermelinda Dellen, RN Scrub Person: Magnus Sinning, [place] T  Estimated Blood Loss: Minimal               Procedure: The patient was brought to the operating room and identifies correct patient.  She is placed upon the operating table general anesthesia was induced.  Her abdomen was then prepped and draped in usual sterile fashion.  I made a small incision the patient's left upper quadrant with a scalpel and then used the 5 mm trocar and Optiview camera to slowly traversed all layers of the abdominal wall under direct vision and gain entrance to the peritoneal cavity.  Insufflation of the abdomen was then begun.  I evaluated the entrance site and saw no evidence of bowel injury.  The patient had a moderate amount of omentum adherent to the midline above the umbilicus.  I placed a 5 mm trocar in the patient's left lower quadrant and then with the harmonic scalpel was able to take down all the omentum from the abdominal wall.  I also had to free up adhesions to the falciform ligament and liver to the abdominal wall as well.  No bowel was involved in the hernia.  We then measured the size of the fascial defect.  Next a 20 cm x 25 cm piece of ventral light ST mesh from [employer] was brought to the field.  I placed 4 separate 0 Novafil sutures and the edges of the mesh.  I then rolled the mesh up and soaked in saline.  We placed a 11 mm trocar in the patient's right lower quadrant and another 5 mm trocar in the patient's right upper quadrant under direct vision.  I then placed the mesh through the 11 mm trocar.  I then unrolled it under direct vision in the abdominal cavity.  I then  made 4 separate stab incisions with an 11 blade and use the suture passer to pull the sutures up through the abdominal wall.  The sutures were then tied in place pulling the mesh up against the abdominal wall.  Wide coverage of the fascial defect appeared to be achieved.  I then tacked the mesh and circumferentially with absorbable tacks.  At this point hemostasis peer to be achieved.  Again wide coverage of the fascial defect appear to be achieved.  All trochars were then removed under direct vision and the abdomen was deflated.  All incisions were then anesthetized with Marcaine.  I closed the 11 mm trocar with a 2-0 Vicryl suture.  All skin incisions were then closed with 4-0 Monocryl and Dermabond.  The patient tolerated the procedure well.  All counts were correct at the end of the procedure.  Patient was then extubated in the operating room and taken in a stable condition to the recovery room.          Abigail Miyamoto   Date: 01/20/2020  Time: 10:26 AM

## 2020-01-20 NOTE — Anesthesia Preprocedure Evaluation (Signed)
Anesthesia Evaluation  Patient identified by MRN, date of birth, ID band Patient awake    Reviewed: Allergy & Precautions, NPO status , Patient's Chart, lab work & pertinent test results  Airway Mallampati: II  TM Distance: >3 FB Neck ROM: Full    Dental  (+) Poor Dentition, Missing, Loose   Pulmonary asthma , Current Smoker and Patient abstained from smoking.,    Pulmonary exam normal breath sounds clear to auscultation       Cardiovascular hypertension, + Peripheral Vascular Disease  Normal cardiovascular exam Rhythm:Regular Rate:Normal  ECG: NSR, rate 81   Neuro/Psych  Headaches, PSYCHIATRIC DISORDERS Anxiety Attention deficit disorder   GI/Hepatic negative GI ROS, Neg liver ROS,   Endo/Other  negative endocrine ROS  Renal/GU negative Renal ROS     Musculoskeletal negative musculoskeletal ROS (+)   Abdominal (+) + obese,   Peds  Hematology negative hematology ROS (+)   Anesthesia Other Findings Dental caries  Reproductive/Obstetrics hcg negative                             Anesthesia Physical  Anesthesia Plan  ASA: III  Anesthesia Plan: General   Post-op Pain Management:    Induction: Intravenous  PONV Risk Score and Plan: 4 or greater and Ondansetron, Dexamethasone, Treatment may vary due to age or medical condition and Midazolam  Airway Management Planned: Oral ETT  Additional Equipment:   Intra-op Plan:   Post-operative Plan: Extubation in OR  Informed Consent: I have reviewed the patients History and Physical, chart, labs and discussed the procedure including the risks, benefits and alternatives for the proposed anesthesia with the patient or authorized representative who has indicated his/her understanding and acceptance.     Dental advisory given  Plan Discussed with: CRNA  Anesthesia Plan Comments:         Anesthesia Quick Evaluation

## 2020-01-20 NOTE — Transfer of Care (Signed)
Immediate Anesthesia Transfer of Care Note  Patient: Frances Graham  Procedure(s) Performed: LAPAROSCOPIC INCISIONAL HERNIA REPAIR WITH MESH (N/A )  Patient Location: PACU  Anesthesia Type:General  Level of Consciousness: awake, alert  and oriented  Airway & Oxygen Therapy: Patient Spontanous Breathing and Patient connected to nasal cannula oxygen  Post-op Assessment: Report given to RN, Post -op Vital signs reviewed and stable and Patient moving all extremities X 4  Post vital signs: Reviewed and stable  Last Vitals:  Vitals Value Taken Time  BP 157/97 01/20/20 1029  Temp    Pulse 90 01/20/20 1031  Resp 10 01/20/20 1031  SpO2 98 % 01/20/20 1031  Vitals shown include unvalidated device data.  Last Pain:  Vitals:   01/20/20 0731  TempSrc:   PainSc: 4       Patients Stated Pain Goal: 3 (01/20/20 0731)  Complications: No apparent anesthesia complications

## 2020-01-21 DIAGNOSIS — Z79899 Other long term (current) drug therapy: Secondary | ICD-10-CM | POA: Diagnosis not present

## 2020-01-21 DIAGNOSIS — Z818 Family history of other mental and behavioral disorders: Secondary | ICD-10-CM | POA: Diagnosis not present

## 2020-01-21 DIAGNOSIS — Z20822 Contact with and (suspected) exposure to covid-19: Secondary | ICD-10-CM | POA: Diagnosis present

## 2020-01-21 DIAGNOSIS — K430 Incisional hernia with obstruction, without gangrene: Secondary | ICD-10-CM | POA: Diagnosis present

## 2020-01-21 DIAGNOSIS — E669 Obesity, unspecified: Secondary | ICD-10-CM | POA: Diagnosis present

## 2020-01-21 DIAGNOSIS — K660 Peritoneal adhesions (postprocedural) (postinfection): Secondary | ICD-10-CM | POA: Diagnosis present

## 2020-01-21 DIAGNOSIS — Z8261 Family history of arthritis: Secondary | ICD-10-CM | POA: Diagnosis not present

## 2020-01-21 DIAGNOSIS — Z8249 Family history of ischemic heart disease and other diseases of the circulatory system: Secondary | ICD-10-CM | POA: Diagnosis not present

## 2020-01-21 LAB — CBC
HCT: 38.9 % (ref 36.0–46.0)
Hemoglobin: 12.8 g/dL (ref 12.0–15.0)
MCH: 32.3 pg (ref 26.0–34.0)
MCHC: 32.9 g/dL (ref 30.0–36.0)
MCV: 98.2 fL (ref 80.0–100.0)
Platelets: 238 10*3/uL (ref 150–400)
RBC: 3.96 MIL/uL (ref 3.87–5.11)
RDW: 13.1 % (ref 11.5–15.5)
WBC: 17 10*3/uL — ABNORMAL HIGH (ref 4.0–10.5)
nRBC: 0 % (ref 0.0–0.2)

## 2020-01-21 LAB — BASIC METABOLIC PANEL
Anion gap: 7 (ref 5–15)
BUN: 13 mg/dL (ref 6–20)
CO2: 22 mmol/L (ref 22–32)
Calcium: 8.3 mg/dL — ABNORMAL LOW (ref 8.9–10.3)
Chloride: 107 mmol/L (ref 98–111)
Creatinine, Ser: 0.66 mg/dL (ref 0.44–1.00)
GFR calc Af Amer: >60 mL/min (ref >60)
GFR calc non Af Amer: >60 mL/min (ref >60)
Glucose, Bld: 135 mg/dL — ABNORMAL HIGH (ref 70–99)
Potassium: 4.5 mmol/L (ref 3.5–5.1)
Sodium: 136 mmol/L (ref 135–145)

## 2020-01-21 MED ORDER — HYDROCODONE-ACETAMINOPHEN 5-325 MG PO TABS: 1.0000 | ORAL_TABLET | Freq: Four times a day (QID) | ORAL | 0 refills | Status: DC | PRN

## 2020-01-21 NOTE — Progress Notes (Signed)
Patient ID: Frances Graham, female   DOB: 1965-12-09, 54 y.o.   MRN: [id number]   Still requiring IV pain meds Will stay until tomorrow

## 2020-01-21 NOTE — Anesthesia Postprocedure Evaluation (Signed)
Anesthesia Post Note  Patient: Frances Graham  Procedure(s) Performed: LAPAROSCOPIC INCISIONAL HERNIA REPAIR WITH MESH (N/A )     Patient location during evaluation: PACU Anesthesia Type: General Level of consciousness: sedated and patient cooperative Pain management: pain level controlled Vital Signs Assessment: post-procedure vital signs reviewed and stable Respiratory status: spontaneous breathing Cardiovascular status: stable Anesthetic complications: no    Last Vitals:  Vitals:   01/21/20 1132 01/21/20 1327  BP: 130/72 (!) 145/72  Pulse: 82 79  Resp: 18 19  Temp: 37.2 C 36.7 C  SpO2: 94% 95%    Last Pain:  Vitals:   01/21/20 1700  TempSrc:   PainSc: 5                  Lewie Loron

## 2020-01-21 NOTE — Discharge Instructions (Signed)
[facility] _____[facility]  UMBILICAL OR INGUINAL HERNIA REPAIR: POST OP INSTRUCTIONS  Always review your discharge instruction sheet given to you by the facility where your surgery was performed. IF YOU HAVE DISABILITY OR FAMILY LEAVE FORMS, YOU MUST BRING THEM TO THE OFFICE FOR PROCESSING.   DO NOT GIVE THEM TO YOUR DOCTOR.  1. A  prescription for pain medication may be given to you upon discharge.  Take your pain medication as prescribed, if needed.  If narcotic pain medicine is not needed, then you may take acetaminophen (Tylenol) or ibuprofen (Advil) as needed. 2. Take your usually prescribed medications unless otherwise directed. If you need a refill on your pain medication, please contact your pharmacy.  They will contact our office to request authorization. Prescriptions will not be filled after 5 pm or on week-ends. 3. You should follow a light diet the first 24 hours after arrival home, such as soup and crackers, etc.  Be sure to include lots of fluids daily.  Resume your normal diet the day after surgery. 4.Most patients will experience some swelling and bruising around the umbilicus or in the groin and scrotum.  Ice packs and reclining will help.  Swelling and bruising can take several days to resolve.  6. It is common to experience some constipation if taking pain medication after surgery.  Increasing fluid intake and taking a stool softener (such as Colace) will usually help or prevent this problem from occurring.  A mild laxative (Milk of Magnesia or Miralax) should be taken according to package directions if there are no bowel movements after 48 hours. 7. Unless discharge instructions indicate otherwise, you may remove your bandages 24-48 hours after surgery, and you may shower at that time.  You may have steri-strips (small skin tapes) in place directly over the incision.  These strips should be left on the skin for 7-10 days.  If your surgeon used skin glue on the  incision, you may shower in 24 hours.  The glue will flake off over the next 2-3 weeks.  Any sutures or staples will be removed at the office during your follow-up visit. 8. ACTIVITIES:  You may resume regular (light) daily activities beginning the next day--such as daily self-care, walking, climbing stairs--gradually increasing activities as tolerated.  You may have sexual intercourse when it is comfortable.  Refrain from any heavy lifting or straining until approved by your doctor.  a.You may drive when you are no longer taking prescription pain medication, you can comfortably wear a seatbelt, and you can safely maneuver your car and apply brakes. b.RETURN TO WORK:   _____________________________________________  9.You should see your doctor in the office for a follow-up appointment approximately 2-3 weeks after your surgery.  Make sure that you call for this appointment within a day or two after you arrive home to insure a convenient appointment time. 10.OTHER INSTRUCTIONS: OK TO SHOWER ICE PACK, IBUPROFEN, OR NAPROSYN ALSO FOR PAIN NO LIFTING MORE THAN 15 POUNDS FOR 6 WEEKS_________________________    _____________________________________  WHEN TO CALL YOUR DOCTOR: 1. Fever over 101.0 2. Inability to urinate 3. Nausea and/or vomiting 4. Extreme swelling or bruising 5. Continued bleeding from incision. 6. Increased pain, redness, or drainage from the incision  The clinic staff is available to answer your questions during regular business hours.  Please don't hesitate to call and ask to speak to one of the nurses for clinical concerns.  If you have a medical emergency, go to the nearest emergency room  or call 911.  A surgeon from [facility] is always on call at the hospital   [address], [address], [place], [place]  [address] ?  [address] [address], [place], [place]   [address] [phone] ? [phone] ? FAX [phone] Web site: [email or web address] 

## 2020-01-22 MED ORDER — ACETAMINOPHEN 500 MG PO TABS
1000.0000 mg | ORAL_TABLET | Freq: Four times a day (QID) | ORAL | Status: DC
  Filled 2020-01-22: qty 2

## 2020-01-22 MED ORDER — METHOCARBAMOL 500 MG PO TABS: 500.0000 mg | ORAL_TABLET | Freq: Three times a day (TID) | ORAL | 0 refills | Status: DC | PRN

## 2020-01-22 MED ORDER — OXYCODONE HCL 5 MG PO TABS
5.0000 mg | ORAL_TABLET | Freq: Four times a day (QID) | ORAL | Status: DC | PRN
  Administered 2020-01-22 (×2): 5 mg via ORAL
  Filled 2020-01-22 (×2): qty 1

## 2020-01-22 MED ORDER — METHOCARBAMOL 500 MG PO TABS
500.0000 mg | ORAL_TABLET | Freq: Three times a day (TID) | ORAL | Status: DC
  Administered 2020-01-22: 500 mg via ORAL
  Filled 2020-01-22: qty 1

## 2020-01-22 NOTE — Progress Notes (Signed)
2 Days Post-Op   Subjective/Chief Complaint: A lot of pain still, having bowel function, no n/v urinating   Objective: Vital signs in last 24 hours: Temp:  [98 F (36.7 C)-98.9 F (37.2 C)] 98.3 F (36.8 C) (05/01 0456) Pulse Rate:  [73-82] 73 (05/01 0456) Resp:  [18-19] 19 (05/01 0456) BP: (128-145)/(64-78) 128/64 (05/01 0456) SpO2:  [92 %-95 %] 94 % (05/01 0456) Last BM Date: 01/20/20  Intake/Output from previous day: 04/30 0701 - 05/01 0700 In: 2990.2 [P.O.:960; I.V.:2030.2] Out: 900 [Urine:900] Intake/Output this shift: Total I/O In: 260 [P.O.:260] Out: -   Resp: clear to auscultation bilaterally Cardio: regular rate and rhythm GI: soft approp tender bs present all incisions clean  Lab Results:  Recent Labs    01/21/20 0203  WBC 17.0*  HGB 12.8  HCT 38.9  PLT 238   BMET Recent Labs    01/21/20 0203  NA 136  K 4.5  CL 107  CO2 22  GLUCOSE 135*  BUN 13  CREATININE 0.66  CALCIUM 8.3*   PT/INR No results for input(s): LABPROT, INR in the last 72 hours. ABG No results for input(s): PHART, HCO3 in the last 72 hours.  Invalid input(s): PCO2, PO2  Studies/Results: No results found.  Anti-infectives: Anti-infectives (From admission, onward)   Start     Dose/Rate Route Frequency Ordered Stop   01/20/20 0700  ciprofloxacin (CIPRO) IVPB 400 mg     400 mg 200 mL/hr over 60 Minutes Intravenous On call to O.R. 01/20/20 9518 01/20/20 0839      Assessment/Plan: POD 1 lap incisional hernia repair -having bowel function -pain control issues not unexpected -regular diet -adjust pain control, pulm toilet -possibly dc in am  Emelia Loron 01/22/2020

## 2020-01-22 NOTE — Progress Notes (Signed)
Encouraged patient to get from bed to recliner and patient states she will do it later. Patient states she has walked around room, no staff has observed this.  Will continue to encourage patient to ambulate in order to help reduce the risk of blood clots and faster healing.

## 2020-01-22 NOTE — Progress Notes (Signed)
Patient discharged to home. Verbalizes understanding of all discharge instructions including incision care, discharge medications, and follow up MD visits. Patient's 3 in 1 delivered to room prior to discharge.

## 2020-01-22 NOTE — Care Management (Signed)
Patient to d/c today.  3n1 ordered for delivery to room prior to d/c.

## 2020-01-22 NOTE — Progress Notes (Signed)
Pt thinks can go home now and would like to. Only thing keeping her here was pain control so will dc home

## 2020-01-22 NOTE — Progress Notes (Signed)
Patient educated on Lovenox and importance of the medication. Patient refuses and states she has not been taking that and does not want it.   Instructed patient to get up and walk and sit up in her chair for a while today.

## 2020-01-24 NOTE — Discharge Summary (Signed)
Physician Discharge Summary  Patient ID: Frances Graham MRN: [id number] DOB/AGE: Oct 05, 1965 54 y.o.  Admit date: 01/20/2020 Discharge date: 01/24/2020  Admission Diagnoses:  Discharge Diagnoses:  Active Problems:   Incisional hernia   Discharged Condition: good  Hospital Course: patient admitted post op.  Took several days to get her pain controlled.  Otherwise, she did well.  Discharged home POD#3  Consults: None  Significant Diagnostic Studies:   Treatments: surgery: laparoscopic incisional hernia repair with mesh  Discharge Exam: Blood pressure 134/69, pulse 69, temperature 98.1 F (36.7 C), temperature source Oral, resp. rate 16, height 5\' 7"  (1.702 m), weight 113.4 kg, last menstrual period 12/23/2019, SpO2 93 %. General appearance: alert, cooperative and no distress Resp: clear to auscultation bilaterally Cardio: regular rate and rhythm, S1, S2 normal, no murmur, click, rub or gallop Incision/Wound:incisions clean, hernia repair intact  Disposition: Discharge disposition: 01-Home or Self Care        Allergies as of 01/22/2020      Reactions   Bee Venom Anaphylaxis   Penicillins Anaphylaxis   Has patient had a PCN reaction causing immediate rash, facial/tongue/throat swelling, SOB or lightheadedness with hypotension: Yes Has patient had a PCN reaction causing severe rash involving mucus membranes or skin necrosis: No Has patient had a PCN reaction that required hospitalization Yes Has patient had a PCN reaction occurring within the last 10 years: No If all of the above answers are "NO", then may proceed with Cephalosporin use.   Benadryl [diphenhydramine Hcl] Other (See Comments)   syncope   Celebrex [celecoxib] Nausea And Vomiting, Rash   Keflex [cephalexin] Nausea And Vomiting, Rash, Other (See Comments)   Projectile vomiting, thrush, fever blisters, ED visit   Lidocaine Swelling, Rash   Foot swelled up like a balloon   Codeine Other (See Comments)    Reflux. Pt states not allergic to hydrocodone and oxycodone .      Medication List    TAKE these medications   amphetamine-dextroamphetamine 20 MG tablet Commonly known as: ADDERALL Take 20 mg by mouth daily as needed (attention/focus/ADD).   HYDROcodone-acetaminophen 5-325 MG tablet Commonly known as: NORCO/VICODIN Take 1 tablet by mouth every 6 (six) hours as needed for moderate pain. What changed: when to take this   ibuprofen 200 MG tablet Commonly known as: ADVIL Take 800 mg by mouth every 8 (eight) hours as needed (pain).   methocarbamol 500 MG tablet Commonly known as: ROBAXIN Take 1 tablet (500 mg total) by mouth every 8 (eight) hours as needed for muscle spasms.   traZODone 150 MG tablet Commonly known as: DESYREL Take by mouth at bedtime as needed for sleep.      Follow-up Information    03/23/2020, MD. Schedule an appointment as soon as possible for a visit in 3 week(s).   Specialty: General Surgery Contact information: [address] [place] [phone]           Signed: [phone] 01/24/2020, 1:54 PM

## 2020-04-12 ENCOUNTER — Ambulatory Visit: Payer: Medicaid Other | Admitting: Podiatry

## 2020-05-04 ENCOUNTER — Other Ambulatory Visit: Payer: Self-pay

## 2020-05-04 ENCOUNTER — Encounter: Payer: Self-pay | Admitting: Podiatry

## 2020-05-04 ENCOUNTER — Ambulatory Visit (INDEPENDENT_AMBULATORY_CARE_PROVIDER_SITE_OTHER): Payer: Medicaid Other

## 2020-05-04 ENCOUNTER — Ambulatory Visit: Payer: Medicaid Other | Admitting: Podiatry

## 2020-05-04 DIAGNOSIS — M7751 Other enthesopathy of right foot: Secondary | ICD-10-CM

## 2020-05-04 DIAGNOSIS — M7661 Achilles tendinitis, right leg: Secondary | ICD-10-CM | POA: Diagnosis not present

## 2020-05-04 DIAGNOSIS — M7750 Other enthesopathy of unspecified foot: Secondary | ICD-10-CM

## 2020-05-04 NOTE — Progress Notes (Signed)
  Subjective:  Patient ID: Frances Graham, female    DOB: July 02, 1966,  MRN: [id number]  Chief Complaint  Patient presents with  . Foot Pain     pain around the back of her heel since July - tried steroid pack, icing and stretches    54 y.o. female presents with the above complaint. History confirmed with patient.  Methylprednisolone pack made her feel kind of crazy, ibuprofen made her stomach upset.  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  She had bypass surgery and then an amputation thereafter.  Objective:  Physical Exam: Right lower extremity pulses weakly palpable with his skin is warm and has normal capillary fill time.  Pain palpation to the posterior heel and Achilles tendon insertion.  Mild pain with resisted plantarflexion.  No pain on the plantar fascia or insertion.  Radiographs: X-ray of the right foot: no fracture, dislocation, swelling or degenerative changes noted, posterior calcaneal spur, Haglund deformity noted and pes planus Assessment:   1. Tendonitis, Achilles, right   2. Tendonitis of ankle or foot      Plan:  Patient was evaluated and treated and all questions answered.  Achilles Tendonitis -XR reviewed with patient -Educated on stretching and icing of the affected limb. -Referral placed to physical therapy.  -Advised her to walk and heel lifts in her CAM boot -WBAT in CAM boot.  May come out of boot for therapy -Advised to use Voltaren gel as needed as she did not tolerate oral steroids or NSAIDs  Return in about 6 weeks (around 06/15/2020) for right foot pain.

## 2020-05-04 NOTE — Patient Instructions (Addendum)

## 2020-06-15 ENCOUNTER — Ambulatory Visit: Payer: Medicaid Other | Admitting: Podiatry

## 2020-06-29 ENCOUNTER — Ambulatory Visit: Payer: Medicaid Other | Admitting: Podiatry

## 2020-06-29 ENCOUNTER — Other Ambulatory Visit: Payer: Self-pay

## 2020-06-29 ENCOUNTER — Encounter: Payer: Self-pay | Admitting: Podiatry

## 2020-06-29 DIAGNOSIS — M7661 Achilles tendinitis, right leg: Secondary | ICD-10-CM

## 2020-06-29 NOTE — Progress Notes (Signed)
  Subjective:  Patient ID: Frances Graham, female    DOB: 03-17-1966,  MRN: [id number]  Chief Complaint  Patient presents with  . Foot Pain    Pt stated that she was hurt durring her 2nd physical therapy appointment and she is just trying to get back to base line     54 y.o. female presents with the above complaint. History confirmed with patient.  She began physical therapy but stopped after the second visit when they did [employer] and this was very uncomfortable for her.  She said it made her ankle swell and hurt for a couple weeks afterwards.  She is not wearing a CAM boot today she said that she had to stop using it after couple weeks because it made her back and hips hurt and felt really off level.  Voltaren gel was not helpful  Objective:  Physical Exam: Right lower extremity pulses weakly palpable skin is warm and has normal capillary fill time.  Pain palpation to the posterior heel and Achilles tendon insertion.  Mild pain with resisted plantarflexion.  No pain on the plantar fascia or insertion.  Radiographs: X-ray of the right foot: no fracture, dislocation, swelling or degenerative changes noted, posterior calcaneal spur, Haglund deformity noted and pes planus Assessment:   1. Tendonitis, Achilles, right      Plan:  Patient was evaluated and treated and all questions answered.  Achilles Tendonitis -Recommended that she resume physical therapy and not have Graston Technique if this made her feel worse. -Recommend using an even up shoe brace to level out her hips well in a CAM boot on the other side -Continue using CAM boot. -Unfortunately quite limited due to the fact that she has not tolerated NSAIDs or prednisone before and so anti-inflammatories are not really an option.  Voltaren was not helpful.  We also discussed shockwave therapy but this would be difficult for her to pay for out-of-pocket as she is currently not working   Return in about 2 months (around  08/29/2020) for re-check Achilles tendon.

## 2020-06-29 NOTE — Patient Instructions (Signed)
Look for an "EvenUp" shoe attachment on [employer] or at [employer]. This will level out your hips while you are walking in the CAM boot. Wear this on the other foot around a supportive sneaker:     

## 2020-08-31 ENCOUNTER — Ambulatory Visit: Payer: Medicaid Other | Admitting: Podiatry

## 2020-08-31 ENCOUNTER — Other Ambulatory Visit: Payer: Self-pay

## 2020-08-31 DIAGNOSIS — M7661 Achilles tendinitis, right leg: Secondary | ICD-10-CM | POA: Diagnosis not present

## 2020-08-31 DIAGNOSIS — Z89411 Acquired absence of right great toe: Secondary | ICD-10-CM | POA: Diagnosis not present

## 2020-08-31 DIAGNOSIS — I739 Peripheral vascular disease, unspecified: Secondary | ICD-10-CM

## 2020-09-01 NOTE — Progress Notes (Signed)
  Subjective:  Patient ID: Frances Graham, female    DOB: 22-Dec-1965,  MRN: [id number]  Chief Complaint  Patient presents with  . 2 month follow up    F/u for re-check for right achilles tendon. Pt states she is still having pain.     54 y.o. female returns with the above complaint. History confirmed with patient.  She is getting ready to go to [facility] for treatment with ultrasound with a chiropractor that is going to be doing it for free for her.  She is still interested in having orthotics made.  Objective:  Physical Exam: Right lower extremity pulses weakly palpable skin is warm and has normal capillary fill time.  Pain palpation to the posterior heel and Achilles tendon insertion.  Mild pain with resisted plantarflexion.  No pain on the plantar fascia or insertion.  Radiographs: X-ray of the right foot: no fracture, dislocation, swelling or degenerative changes noted, posterior calcaneal spur, Haglund deformity noted and pes planus Assessment:   1. History of amputation of right great toe (HCC)   2. Tendonitis, Achilles, right   3. Peripheral vascular disease (HCC)      Plan:  Patient was evaluated and treated and all questions answered.  Achilles Tendonitis -I think the ultrasound treatments will help quite a bit as well as the therapy associated with it.  When I refer her that she is able to find such a helpful resource.  She will continue treatments there -I think orthotics would also help her as well with support along the longitudinal arch.  I wrote her a prescription for [facility].  Think it will be beneficial to have an accommodative orthotic for the right foot with a toe filler for the hallux amputation site.  Return if symptoms worsen or fail to improve.

## 2021-12-04 ENCOUNTER — Other Ambulatory Visit: Payer: Self-pay | Admitting: Family Medicine

## 2021-12-04 DIAGNOSIS — M5416 Radiculopathy, lumbar region: Secondary | ICD-10-CM

## 2021-12-17 ENCOUNTER — Other Ambulatory Visit: Payer: Medicaid Other

## 2022-05-03 ENCOUNTER — Other Ambulatory Visit: Payer: Self-pay | Admitting: Family Medicine

## 2022-05-03 DIAGNOSIS — N6452 Nipple discharge: Secondary | ICD-10-CM

## 2022-05-06 ENCOUNTER — Ambulatory Visit: Payer: Medicaid Other

## 2022-05-06 ENCOUNTER — Ambulatory Visit
Admission: RE | Admit: 2022-05-06 | Discharge: 2022-05-06 | Disposition: A | Discharge status: Home / Self Care | Payer: Medicaid Other | Source: Ambulatory Visit | Attending: Family Medicine | Admitting: Family Medicine

## 2022-05-06 DIAGNOSIS — N6452 Nipple discharge: Secondary | ICD-10-CM

## 2022-07-02 ENCOUNTER — Ambulatory Visit
Admission: RE | Admit: 2022-07-02 | Discharge: 2022-07-02 | Disposition: A | Discharge status: Home / Self Care | Payer: Medicaid Other | Source: Ambulatory Visit | Attending: Family Medicine | Admitting: Family Medicine

## 2022-07-02 ENCOUNTER — Other Ambulatory Visit: Payer: Self-pay | Admitting: Family Medicine

## 2022-07-02 DIAGNOSIS — R1031 Right lower quadrant pain: Secondary | ICD-10-CM

## 2022-08-06 ENCOUNTER — Other Ambulatory Visit: Payer: Self-pay | Admitting: Family Medicine

## 2022-08-06 DIAGNOSIS — R1031 Right lower quadrant pain: Secondary | ICD-10-CM

## 2022-08-08 ENCOUNTER — Emergency Department (HOSPITAL_COMMUNITY): Payer: Medicaid Other

## 2022-08-08 ENCOUNTER — Emergency Department (HOSPITAL_COMMUNITY)
Admission: EM | Admit: 2022-08-08 | Discharge: 2022-08-08 | Disposition: A | Discharge status: Home / Self Care | Payer: Medicaid Other | Attending: Emergency Medicine | Admitting: Emergency Medicine

## 2022-08-08 ENCOUNTER — Other Ambulatory Visit: Payer: Self-pay

## 2022-08-08 DIAGNOSIS — D72829 Elevated white blood cell count, unspecified: Secondary | ICD-10-CM | POA: Diagnosis not present

## 2022-08-08 DIAGNOSIS — R1031 Right lower quadrant pain: Secondary | ICD-10-CM | POA: Diagnosis present

## 2022-08-08 DIAGNOSIS — R1030 Lower abdominal pain, unspecified: Secondary | ICD-10-CM

## 2022-08-08 LAB — COMPREHENSIVE METABOLIC PANEL
ALT: 14 U/L (ref 0–44)
AST: 28 U/L (ref 15–41)
Albumin: 4 g/dL (ref 3.5–5.0)
Alkaline Phosphatase: 85 U/L (ref 38–126)
Anion gap: 6 (ref 5–15)
BUN: 11 mg/dL (ref 6–20)
CO2: 25 mmol/L (ref 22–32)
Calcium: 9.1 mg/dL (ref 8.9–10.3)
Chloride: 104 mmol/L (ref 98–111)
Creatinine, Ser: 0.9 mg/dL (ref 0.44–1.00)
GFR, Estimated: >60 mL/min (ref >60)
Glucose, Bld: 93 mg/dL (ref 70–99)
Potassium: 5.5 mmol/L — ABNORMAL HIGH (ref 3.5–5.1)
Sodium: 135 mmol/L (ref 135–145)
Total Bilirubin: 1.5 mg/dL — ABNORMAL HIGH (ref 0.3–1.2)
Total Protein: 7 g/dL (ref 6.5–8.1)

## 2022-08-08 LAB — CBC WITH DIFFERENTIAL (CANCER CENTER ONLY)
Abs Immature Granulocytes: 0.03 10*3/uL (ref 0.00–0.07)
Basophils Absolute: 0 10*3/uL (ref 0.0–0.1)
Basophils Relative: 0 %
Eosinophils Absolute: 0.2 10*3/uL (ref 0.0–0.5)
Eosinophils Relative: 2 %
HCT: 45.8 % (ref 36.0–46.0)
Hemoglobin: 14.9 g/dL (ref 12.0–15.0)
Immature Granulocytes: 0 %
Lymphocytes Relative: 30 %
Lymphs Abs: 3.4 10*3/uL (ref 0.7–4.0)
MCH: 31.2 pg (ref 26.0–34.0)
MCHC: 32.5 g/dL (ref 30.0–36.0)
MCV: 96 fL (ref 80.0–100.0)
Monocytes Absolute: 0.9 10*3/uL (ref 0.1–1.0)
Monocytes Relative: 8 %
Neutro Abs: 6.6 10*3/uL (ref 1.7–7.7)
Neutrophils Relative %: 60 %
Platelet Count: 256 10*3/uL (ref 150–400)
RBC: 4.77 MIL/uL (ref 3.87–5.11)
RDW: 12.4 % (ref 11.5–15.5)
WBC Count: 11.3 10*3/uL — ABNORMAL HIGH (ref 4.0–10.5)
nRBC: 0 % (ref 0.0–0.2)

## 2022-08-08 LAB — I-STAT BETA HCG BLOOD, ED (MC, WL, AP ONLY): I-stat hCG, quantitative: <5 m[IU]/mL (ref <5)

## 2022-08-08 LAB — URINALYSIS, ROUTINE W REFLEX MICROSCOPIC
Bilirubin Urine: NEGATIVE
Glucose, UA: NEGATIVE mg/dL
Hgb urine dipstick: NEGATIVE
Ketones, ur: NEGATIVE mg/dL
Leukocytes,Ua: NEGATIVE
Nitrite: NEGATIVE
Protein, ur: NEGATIVE mg/dL
Specific Gravity, Urine: >1.046 — ABNORMAL HIGH (ref 1.005–1.030)
pH: 5 (ref 5.0–8.0)

## 2022-08-08 LAB — LIPASE, BLOOD: Lipase: 24 U/L (ref 11–51)

## 2022-08-08 MED ORDER — IOHEXOL 300 MG/ML  SOLN
100.0000 mL | Freq: Once | INTRAMUSCULAR | Status: AC | PRN
  Administered 2022-08-08: 100 mL via INTRAVENOUS

## 2022-08-08 MED ORDER — SODIUM CHLORIDE (PF) 0.9 % IJ SOLN
INTRAMUSCULAR | Status: AC
  Filled 2022-08-08: qty 50

## 2022-08-08 MED ORDER — SODIUM CHLORIDE 0.9 % IV BOLUS
500.0000 mL | Freq: Once | INTRAVENOUS | Status: AC
  Administered 2022-08-08: 500 mL via INTRAVENOUS

## 2022-08-08 MED ORDER — ONDANSETRON HCL 4 MG/2ML IJ SOLN
4.0000 mg | Freq: Once | INTRAMUSCULAR | Status: AC
  Administered 2022-08-08: 4 mg via INTRAVENOUS
  Filled 2022-08-08: qty 2

## 2022-08-08 MED ORDER — HYDROMORPHONE HCL 1 MG/ML IJ SOLN
1.0000 mg | Freq: Once | INTRAMUSCULAR | Status: AC
  Administered 2022-08-08: 1 mg via INTRAVENOUS
  Filled 2022-08-08: qty 1

## 2022-08-08 NOTE — ED Triage Notes (Signed)
Pt states lower abd pain radiating to lower back and groin x1 year and pcp recommending CT scan but insurance wouldn't improve. Pt states pain has worsen x4 days with nausea.  Pain relief in fetal position.  Hx hernia surgery with mesh

## 2022-08-08 NOTE — ED Provider Notes (Signed)
[facility]-EMERGENCY DEPT Provider Note   CSN: [id number] Arrival date & time: 08/08/22  2836     History  Chief Complaint  Patient presents with   Abdominal Pain    Frances Graham is a 56 y.o. female.  She has a history of peripheral vascular disease and had an aortobifem graft.  She ultimately needed a ventral hernia repair with mesh.  She is here with complaint of right lower abdominal pain radiating to back and groin that is been going on for over a year.  She said it flares up now and again and needs to take narcotic pain medicine for it.  Her doctor has done work-up including lab work and ultrasounds without a clear etiology identified.  She has been waiting on approval to get a CAT scan.  For the last 4 days the pain is intensified along with new pain in her left flank and left lower quadrant.  She has had irregular menses for years and sometimes has some spotting.  No vaginal discharge.  Nausea no vomiting.  No fevers or chills.  Normal bowel movements.  She has seen gynecology and they say she is getting a large fallopian tube on the left and have been watching this.  The history is provided by the patient.  Abdominal Pain Pain location:  LLQ and RLQ Pain radiates to:  Back and groin Pain severity:  Severe Onset quality:  Gradual Timing:  Intermittent Progression:  Unchanged Context: not trauma   Relieved by:  Nothing Ineffective treatments: narcotics. Associated symptoms: nausea   Associated symptoms: no chest pain, no constipation, no cough, no diarrhea, no dysuria, no fever, no hematemesis, no hematochezia, no hematuria, no shortness of breath, no sore throat, no vaginal discharge and no vomiting        Home Medications Prior to Admission medications   Medication Sig Start Date End Date Taking? Authorizing Provider  amphetamine-dextroamphetamine (ADDERALL) 20 MG tablet Take 20 mg by mouth daily as needed (attention/focus/ADD).    [provider]  HYDROcodone-acetaminophen [facility]) 10-325 MG tablet  07/19/20   [provider]  ibuprofen (ADVIL) 200 MG tablet Take 800 mg by mouth every 8 (eight) hours as needed (pain).    [provider]  methocarbamol (ROBAXIN) 500 MG tablet Take 1 tablet (500 mg total) by mouth every 8 (eight) hours as needed for muscle spasms. 01/22/20   Emelia Loron, MD  ondansetron (ZOFRAN) 8 MG tablet Take 8 mg by mouth every 8 (eight) hours as needed. 12/24/19   [provider]  predniSONE (DELTASONE) 20 MG tablet Take by mouth. 03/16/20   [provider]  SUMAtriptan (IMITREX) 100 MG tablet TAKE 1 TABLET BY MOUTH AS NEEDED FOR MIGRAINE AND REPEAT IN 2 HOURS. ONLY UP TO A MAX OF 2 PER DAY 12/31/19   [provider]  traZODone (DESYREL) 150 MG tablet Take by mouth at bedtime as needed for sleep.    [provider]      Allergies    Bee venom, Penicillins, Benadryl [diphenhydramine hcl], Celebrex [celecoxib], Cephalexin, Lidocaine, and Codeine    Review of Systems   Review of Systems  Constitutional:  Negative for fever.  HENT:  Negative for sore throat.   Respiratory:  Negative for cough and shortness of breath.   Cardiovascular:  Negative for chest pain.  Gastrointestinal:  Positive for abdominal pain and nausea. Negative for constipation, diarrhea, hematemesis, hematochezia and vomiting.  Genitourinary:  Positive for menstrual problem and pelvic  pain. Negative for dysuria, hematuria and vaginal discharge.  Musculoskeletal:  Positive for back pain.  Skin:  Negative for rash.    Physical Exam Updated Vital Signs BP (!) 187/105 (BP Location: Left Arm)   Pulse 89   Temp 97.7 F (36.5 C) (Oral)   Resp 20   SpO2 99%  Physical Exam Vitals and nursing note reviewed.  Constitutional:      General: She is not in acute distress.    Appearance: Normal appearance. She is well-developed.  HENT:     Head: Normocephalic and atraumatic.  Eyes:      Conjunctiva/sclera: Conjunctivae normal.  Cardiovascular:     Rate and Rhythm: Normal rate and regular rhythm.     Heart sounds: No murmur heard. Pulmonary:     Effort: Pulmonary effort is normal. No respiratory distress.     Breath sounds: Normal breath sounds.  Abdominal:     Palpations: Abdomen is soft.     Tenderness: There is abdominal tenderness in the right lower quadrant, suprapubic area and left lower quadrant. There is no guarding or rebound.  Musculoskeletal:        General: No deformity.     Cervical back: Neck supple.  Skin:    General: Skin is warm and dry.     Capillary Refill: Capillary refill takes less than 2 seconds.  Neurological:     General: No focal deficit present.     Mental Status: She is alert.     ED Results / Procedures / Treatments   Labs (all labs ordered are listed, but only abnormal results are displayed) Labs Reviewed  COMPREHENSIVE METABOLIC PANEL - Abnormal; Notable for the following components:      Result Value   Potassium 5.5 (*)    Total Bilirubin 1.5 (*)    All other components within normal limits  URINALYSIS, ROUTINE W REFLEX MICROSCOPIC - Abnormal; Notable for the following components:   Specific Gravity, Urine >1.046 (*)    All other components within normal limits  CBC WITH DIFFERENTIAL ([facility] ONLY) - Abnormal; Notable for the following components:   WBC Count 11.3 (*)    All other components within normal limits  LIPASE, BLOOD  CBC WITH DIFFERENTIAL/PLATELET  I-STAT BETA HCG BLOOD, ED ([facility], [facility], [facility] ONLY)    EKG None  Radiology CT Abdomen Pelvis W Contrast  Result Date: 08/08/2022 CLINICAL DATA:  RIGHT lower quadrant abdominal pain. EXAM: CT ABDOMEN AND PELVIS WITH CONTRAST TECHNIQUE: Multidetector CT imaging of the abdomen and pelvis was performed using the standard protocol following bolus administration of intravenous contrast. RADIATION DOSE REDUCTION: This exam was performed according to the departmental  dose-optimization program which includes automated exposure control, adjustment of the mA and/or kV according to patient size and/or use of iterative reconstruction technique. CONTRAST:  144mL OMNIPAQUE IOHEXOL 300 MG/ML  SOLN COMPARISON:  CT 07/13/2016 FINDINGS: Lower chest: Lung bases are clear. Hepatobiliary: No focal hepatic lesion. Single ovoid gallstone measuring 15 mm. No gallbladder inflammation. No biliary duct dilatation. Common bile duct is normal. Pancreas: Pancreas is normal. No ductal dilatation. No pancreatic inflammation. Spleen: Normal spleen Adrenals/urinary tract: Adrenal glands and kidneys are normal. The ureters and bladder normal. Stomach/Bowel: Stomach, small bowel, appendix, and cecum are normal. The colon and rectosigmoid colon are normal. Vascular/Lymphatic: Aortic bypass noted. No complication. No periportal or retroperitoneal adenopathy. No pelvic adenopathy. Reproductive: Uterus and adnexa unremarkable. Other: No free fluid. Musculoskeletal: No aggressive osseous lesion. IMPRESSION: 1. No acute abdominal or pelvic findings.  2. Normal appendix. 3. Single gallstone without evidence cholecystitis. Electronically Signed   By: Suzy Bouchard M.D.   On: 08/08/2022 11:19    Procedures Procedures    Medications Ordered in ED Medications  sodium chloride 0.9 % bolus 500 mL (has no administration in time range)  ondansetron (ZOFRAN) injection 4 mg (has no administration in time range)  HYDROmorphone (DILAUDID) injection 1 mg (has no administration in time range)    ED Course/ Medical Decision Making/ A&P Clinical Course as of 08/08/22 2044  Thu Aug 08, 2022  1212 Patient's work-up does not show any clear etiology of her symptoms.  CT does not show any acute findings.  Reviewed with patient.  Potassium elevated at 5.5 but does comment on hemolysis and she has normal renal function. [MB]    Clinical Course User Index [MB] Hayden Rasmussen, MD                            Medical Decision Making Amount and/or Complexity of Data Reviewed Labs: ordered. Radiology: ordered.  Risk Prescription drug management.   This patient complains of lower abdominal pain and back pain; this involves an extensive number of treatment Options and is a complaint that carries with it a high risk of complications and morbidity. The differential includes diverticulitis, colitis, UTI, pelvic congestion, adhesions, endometriosis, renal colic  I ordered, reviewed and interpreted labs, which included CBC with mildly elevated white count normal hemoglobin, chemistries normal other than elevated potassium likely hemolyzed, normal LFTs, urinalysis without signs of infection, pregnancy test negative I ordered medication IV fluids IV pain medication and reviewed PMP when indicated. I ordered imaging studies which included CT abdomen and pelvis and I independently    visualized and interpreted imaging which showed no acute findings Previous records obtained and reviewed in epic no recent admissions  Cardiac monitoring reviewed, normal sinus rhythm Social determinants considered, ongoing tobacco use Critical Interventions: None  After the interventions stated above, I reevaluated the patient and found patient to be slightly improved and her pain Admission and further testing considered, no indications for admission.  Patient will need close follow-up with her treatment team for further evaluation and recommended also following up with general surgery.  Return instructions discussed.         Final Clinical Impression(s) / ED Diagnoses Final diagnoses:  Lower abdominal pain    Rx / DC Orders ED Discharge Orders     None         Hayden Rasmussen, MD 08/08/22 2046

## 2022-08-08 NOTE — Discharge Instructions (Signed)
You were seen in the emergency department for continued low abdominal pain into back and groin.  You had blood work urinalysis and a CAT scan of your abdomen and pelvis that did not show an explanation for your symptoms.  Please contact your general surgeon for follow-up.  Return to the emergency department if any high fevers or other concerning symptoms.

## 2022-11-03 ENCOUNTER — Emergency Department (HOSPITAL_COMMUNITY): Payer: Medicaid Other

## 2022-11-03 ENCOUNTER — Emergency Department (HOSPITAL_COMMUNITY)
Admission: EM | Admit: 2022-11-03 | Discharge: 2022-11-03 | Disposition: A | Discharge status: Home / Self Care | Payer: Medicaid Other | Attending: Emergency Medicine | Admitting: Emergency Medicine

## 2022-11-03 DIAGNOSIS — R1032 Left lower quadrant pain: Secondary | ICD-10-CM | POA: Insufficient documentation

## 2022-11-03 DIAGNOSIS — E119 Type 2 diabetes mellitus without complications: Secondary | ICD-10-CM | POA: Insufficient documentation

## 2022-11-03 DIAGNOSIS — R1031 Right lower quadrant pain: Secondary | ICD-10-CM | POA: Insufficient documentation

## 2022-11-03 DIAGNOSIS — R1030 Lower abdominal pain, unspecified: Secondary | ICD-10-CM

## 2022-11-03 DIAGNOSIS — I509 Heart failure, unspecified: Secondary | ICD-10-CM | POA: Insufficient documentation

## 2022-11-03 LAB — COMPREHENSIVE METABOLIC PANEL
ALT: 13 U/L (ref 0–44)
AST: 16 U/L (ref 15–41)
Albumin: 4 g/dL (ref 3.5–5.0)
Alkaline Phosphatase: 88 U/L (ref 38–126)
Anion gap: 11 (ref 5–15)
BUN: 12 mg/dL (ref 6–20)
CO2: 23 mmol/L (ref 22–32)
Calcium: 9.1 mg/dL (ref 8.9–10.3)
Chloride: 103 mmol/L (ref 98–111)
Creatinine, Ser: 0.81 mg/dL (ref 0.44–1.00)
GFR, Estimated: >60 mL/min (ref >60)
Glucose, Bld: 106 mg/dL — ABNORMAL HIGH (ref 70–99)
Potassium: 4.6 mmol/L (ref 3.5–5.1)
Sodium: 137 mmol/L (ref 135–145)
Total Bilirubin: 0.6 mg/dL (ref 0.3–1.2)
Total Protein: 7.2 g/dL (ref 6.5–8.1)

## 2022-11-03 LAB — CBC WITH DIFFERENTIAL/PLATELET
Abs Immature Granulocytes: 0.04 10*3/uL (ref 0.00–0.07)
Basophils Absolute: 0.1 10*3/uL (ref 0.0–0.1)
Basophils Relative: 0 %
Eosinophils Absolute: 0.2 10*3/uL (ref 0.0–0.5)
Eosinophils Relative: 2 %
HCT: 45.9 % (ref 36.0–46.0)
Hemoglobin: 15.1 g/dL — ABNORMAL HIGH (ref 12.0–15.0)
Immature Granulocytes: 0 %
Lymphocytes Relative: 24 %
Lymphs Abs: 2.7 10*3/uL (ref 0.7–4.0)
MCH: 31.7 pg (ref 26.0–34.0)
MCHC: 32.9 g/dL (ref 30.0–36.0)
MCV: 96.2 fL (ref 80.0–100.0)
Monocytes Absolute: 1 10*3/uL (ref 0.1–1.0)
Monocytes Relative: 9 %
Neutro Abs: 7.3 10*3/uL (ref 1.7–7.7)
Neutrophils Relative %: 65 %
Platelets: 235 10*3/uL (ref 150–400)
RBC: 4.77 MIL/uL (ref 3.87–5.11)
RDW: 12.9 % (ref 11.5–15.5)
WBC: 11.2 10*3/uL — ABNORMAL HIGH (ref 4.0–10.5)
nRBC: 0 % (ref 0.0–0.2)

## 2022-11-03 LAB — I-STAT BETA HCG BLOOD, ED (MC, WL, AP ONLY): I-stat hCG, quantitative: <5 m[IU]/mL (ref <5)

## 2022-11-03 MED ORDER — IOHEXOL 300 MG/ML  SOLN
100.0000 mL | Freq: Once | INTRAMUSCULAR | Status: AC | PRN
  Administered 2022-11-03: 100 mL via INTRAVENOUS

## 2022-11-03 MED ORDER — SODIUM CHLORIDE 0.9 % IV BOLUS
1000.0000 mL | Freq: Once | INTRAVENOUS | Status: AC
  Administered 2022-11-03: 1000 mL via INTRAVENOUS

## 2022-11-03 MED ORDER — ONDANSETRON HCL 4 MG/2ML IJ SOLN
4.0000 mg | Freq: Once | INTRAMUSCULAR | Status: AC
  Administered 2022-11-03: 4 mg via INTRAVENOUS
  Filled 2022-11-03: qty 2

## 2022-11-03 MED ORDER — DICYCLOMINE HCL 20 MG PO TABS: 20.0000 mg | ORAL_TABLET | Freq: Two times a day (BID) | ORAL | 0 refills | Status: DC

## 2022-11-03 MED ORDER — IOHEXOL 300 MG/ML  SOLN: 100.0000 mL | Freq: Once | INTRAMUSCULAR | Status: DC | PRN

## 2022-11-03 MED ORDER — HYDROMORPHONE HCL 1 MG/ML IJ SOLN
1.0000 mg | Freq: Once | INTRAMUSCULAR | Status: AC
  Administered 2022-11-03: 1 mg via INTRAVENOUS
  Filled 2022-11-03: qty 1

## 2022-11-03 MED ORDER — HALOPERIDOL LACTATE 5 MG/ML IJ SOLN
5.0000 mg | Freq: Once | INTRAMUSCULAR | Status: AC
  Administered 2022-11-03: 5 mg via INTRAVENOUS
  Filled 2022-11-03: qty 1

## 2022-11-03 NOTE — ED Triage Notes (Signed)
Pt presents to ED from home C/O bilateral lower abdominal pain since Thursday. Denies n/v/d. Last BM today, reports less than normal.

## 2022-11-03 NOTE — Discharge Instructions (Addendum)
You are seen in the emergency room for abdominal pain.  Your lab workup, CT scan are reassuring.  We suspect that your pain could be because of lesions.  It is also possible that you are having spasmatic pain.  Please follow-up with the surgeons for their opinion on your chronic abdominal pain.

## 2022-11-03 NOTE — ED Provider Notes (Signed)
[place] EMERGENCY DEPARTMENT AT [facility] Provider Note   CSN: [id number] Arrival date & time: 11/03/22  [id number]     History  No chief complaint on file.   Frances Graham is a 57 y.o. female.  HPI     57 year old female comes in with chief complaint of bilateral lower quadrant abdominal pain.  Patient has history of PAD, diabetes, CHF and incisional hernia repair with questionable adhesions and chronic abdominal pain.  Patient states that she chronically has pain in the right lower quadrant, but over the last 3 days the pain has migrated to the left lower quadrant.  The pain is described as sharp, intermittent and severe.  Anytime she eats, she feels like she starts having pain as it is a food leaves her stomach all the way until she defecates.  Patient has nausea without vomiting.  She is still having bowel movements.  Review of system is negative for any UTI-like symptoms.  Home Medications Prior to Admission medications   Medication Sig Start Date End Date Taking? Authorizing Provider  dicyclomine (BENTYL) 20 MG tablet Take 1 tablet (20 mg total) by mouth 2 (two) times daily. 11/03/22  Yes Wadie Liew, MD  amphetamine-dextroamphetamine (ADDERALL) 20 MG tablet Take 20 mg by mouth daily as needed (attention/focus/ADD).    [provider]  HYDROcodone-acetaminophen [facility]) 10-325 MG tablet  07/19/20   [provider]  ibuprofen (ADVIL) 200 MG tablet Take 800 mg by mouth every 8 (eight) hours as needed (pain).    [provider]  methocarbamol (ROBAXIN) 500 MG tablet Take 1 tablet (500 mg total) by mouth every 8 (eight) hours as needed for muscle spasms. 01/22/20   Rolm Bookbinder, MD  ondansetron (ZOFRAN) 8 MG tablet Take 8 mg by mouth every 8 (eight) hours as needed. 12/24/19   [provider]  predniSONE (DELTASONE) 20 MG tablet Take by mouth. 03/16/20   [provider]  SUMAtriptan (IMITREX) 100 MG tablet TAKE 1  TABLET BY MOUTH AS NEEDED FOR MIGRAINE AND REPEAT IN 2 HOURS. ONLY UP TO A MAX OF 2 PER DAY 12/31/19   [provider]  traZODone (DESYREL) 150 MG tablet Take by mouth at bedtime as needed for sleep.    [provider]      Allergies    Bee venom, Penicillins, Benadryl [diphenhydramine hcl], Celebrex [celecoxib], Cephalexin, Lidocaine, and Codeine    Review of Systems   Review of Systems  All other systems reviewed and are negative.   Physical Exam Updated Vital Signs BP 105/61   Pulse 99   Temp 98.7 F (37.1 C) (Oral)   Resp 15   Ht 5' 7"$  (1.702 m)   Wt 106.6 kg   SpO2 98%   BMI 36.81 kg/m  Physical Exam Vitals and nursing note reviewed.  Constitutional:      Appearance: She is well-developed.  HENT:     Head: Atraumatic.  Cardiovascular:     Rate and Rhythm: Normal rate.  Pulmonary:     Effort: Pulmonary effort is normal.  Abdominal:     Tenderness: There is abdominal tenderness.  Musculoskeletal:     Cervical back: Normal range of motion and neck supple.  Skin:    General: Skin is warm and dry.  Neurological:     Mental Status: She is alert and oriented to person, place, and time.     ED Results / Procedures / Treatments   Labs (all labs ordered are listed, but only  abnormal results are displayed) Labs Reviewed  COMPREHENSIVE METABOLIC PANEL - Abnormal; Notable for the following components:      Result Value   Glucose, Bld 106 (*)    All other components within normal limits  CBC WITH DIFFERENTIAL/PLATELET - Abnormal; Notable for the following components:   WBC 11.2 (*)    Hemoglobin 15.1 (*)    All other components within normal limits  I-STAT BETA HCG BLOOD, ED ([facility], [facility], [facility] ONLY)    EKG None  Radiology CT Abdomen Pelvis W Contrast  Result Date: 11/03/2022 CLINICAL DATA:  Acute lower abdominal pain. EXAM: CT ABDOMEN AND PELVIS WITH CONTRAST TECHNIQUE: Multidetector CT imaging of the abdomen and pelvis was performed using the  standard protocol following bolus administration of intravenous contrast. RADIATION DOSE REDUCTION: This exam was performed according to the departmental dose-optimization program which includes automated exposure control, adjustment of the mA and/or kV according to patient size and/or use of iterative reconstruction technique. CONTRAST:  175m OMNIPAQUE IOHEXOL 300 MG/ML  SOLN COMPARISON:  08/08/2022 FINDINGS: Lower Chest: No acute findings. Hepatobiliary: No hepatic masses identified. Mild diffuse hepatic steatosis noted. Small calcified gallstone again seen, however there is no evidence of cholecystitis or biliary ductal dilatation. Pancreas:  No mass or inflammatory changes. Spleen: Within normal limits in size and appearance. Adrenals/Urinary Tract: No suspicious masses identified. No evidence of ureteral calculi or hydronephrosis. Stomach/Bowel: No evidence of obstruction, inflammatory process or abnormal fluid collections. Normal appendix visualized. Vascular/Lymphatic: No pathologically enlarged lymph nodes. No acute vascular findings. Prior aortobifemoral bypass graft again noted. Reproductive:  No mass or other significant abnormality. Other: Postop changes previous ventral hernia repair again seen. Stable small midline epigastric ventral hernia is again demonstrated which contains only fat. Musculoskeletal:  No suspicious bone lesions identified. IMPRESSION: No acute findings. Stable small epigastric ventral hernia, which contains only fat. Mild hepatic steatosis. Cholelithiasis. No radiographic evidence of cholecystitis. Electronically Signed   By: JMarlaine HindM.D.   On: 11/03/2022 10:20    Procedures Procedures    Medications Ordered in ED Medications  iohexol (OMNIPAQUE) 300 MG/ML solution 100 mL (has no administration in time range)  sodium chloride 0.9 % bolus 1,000 mL (1,000 mLs Intravenous New Bag/Given 11/03/22 0832)  ondansetron (ZOFRAN) injection 4 mg (4 mg Intravenous Given 11/03/22  [id number]  HYDROmorphone (DILAUDID) injection 1 mg (1 mg Intravenous Given 11/03/22 0831)  iohexol (OMNIPAQUE) 300 MG/ML solution 100 mL (100 mLs Intravenous Contrast Given 11/03/22 0954)  haloperidol lactate (HALDOL) injection 5 mg (5 mg Intravenous Given 11/03/22 0944)    ED Course/ Medical Decision Making/ A&P                             Medical Decision Making Problems Addressed: Lower abdominal pain: chronic illness or injury with exacerbation, progression, or side effects of treatment  Amount and/or Complexity of Data Reviewed Labs: ordered. Radiology: ordered.  Risk Prescription drug management.  This patient presents to the ED with chief complaint(s) of abdominal pain with pertinent past medical history of previous abdominal surgery, chronic abdominal pain, diabetes, PAD.The complaint involves an extensive differential diagnosis and also carries with it a high risk of complications and morbidity.    The differential diagnosis includes : Ileus, SBO, mesenteric ischemia, pain secondary to adhesions.  The initial plan is to get basic labs, CT abdomen and pelvis with contrast.   Additional history obtained: Records reviewed previous admission documents.  Patient was admitted in  2021 because of inguinal hernia related complication.  CT abdomen from 2021 and 2023 reviewed.  Independent labs interpretation:  The following labs were independently interpreted: No leukocytosis, basic metabolic profile is reassuring.  UA is also reassuring.  Independent visualization and interpretation of imaging: - I independently visualized the following imaging with scope of interpretation limited to determining acute life threatening conditions related to emergency care: CT abdomen and pelvis, which revealed no evidence of perforation, obstruction.  Treatment and Reassessment: Patient reassessed.  She stated that her pain has returned.  I ordered a dose of Haldol, as THERE appears to be a component  of spasmodic pain.  Final Clinical Impression(s) / ED Diagnoses Final diagnoses:  Lower abdominal pain    Rx / DC Orders ED Discharge Orders          Ordered    dicyclomine (BENTYL) 20 MG tablet  2 times daily        11/03/22 1129              Varney Biles, MD 11/03/22 1132

## 2023-05-11 ENCOUNTER — Emergency Department (HOSPITAL_COMMUNITY): Payer: Medicaid Other

## 2023-05-11 ENCOUNTER — Emergency Department (HOSPITAL_COMMUNITY)
Admission: EM | Admit: 2023-05-11 | Discharge: 2023-05-11 | Disposition: A | Discharge status: Home / Self Care | Payer: Medicaid Other | Attending: Emergency Medicine | Admitting: Emergency Medicine

## 2023-05-11 ENCOUNTER — Encounter (HOSPITAL_COMMUNITY): Payer: Self-pay

## 2023-05-11 DIAGNOSIS — M7918 Myalgia, other site: Secondary | ICD-10-CM | POA: Diagnosis not present

## 2023-05-11 DIAGNOSIS — R0981 Nasal congestion: Secondary | ICD-10-CM | POA: Insufficient documentation

## 2023-05-11 DIAGNOSIS — R5383 Other fatigue: Secondary | ICD-10-CM | POA: Insufficient documentation

## 2023-05-11 DIAGNOSIS — R109 Unspecified abdominal pain: Secondary | ICD-10-CM | POA: Insufficient documentation

## 2023-05-11 DIAGNOSIS — R197 Diarrhea, unspecified: Secondary | ICD-10-CM

## 2023-05-11 LAB — CBC WITH DIFFERENTIAL/PLATELET
Abs Immature Granulocytes: 0.04 10*3/uL (ref 0.00–0.07)
Basophils Absolute: 0 10*3/uL (ref 0.0–0.1)
Basophils Relative: 0 %
Eosinophils Absolute: 0.1 10*3/uL (ref 0.0–0.5)
Eosinophils Relative: 1 %
HCT: 47 % — ABNORMAL HIGH (ref 36.0–46.0)
Hemoglobin: 15.1 g/dL — ABNORMAL HIGH (ref 12.0–15.0)
Immature Granulocytes: 0 %
Lymphocytes Relative: 16 %
Lymphs Abs: 1.9 10*3/uL (ref 0.7–4.0)
MCH: 31.5 pg (ref 26.0–34.0)
MCHC: 32.1 g/dL (ref 30.0–36.0)
MCV: 97.9 fL (ref 80.0–100.0)
Monocytes Absolute: 0.8 10*3/uL (ref 0.1–1.0)
Monocytes Relative: 7 %
Neutro Abs: 8.7 10*3/uL — ABNORMAL HIGH (ref 1.7–7.7)
Neutrophils Relative %: 76 %
Platelets: 203 10*3/uL (ref 150–400)
RBC: 4.8 MIL/uL (ref 3.87–5.11)
RDW: 13.2 % (ref 11.5–15.5)
WBC: 11.6 10*3/uL — ABNORMAL HIGH (ref 4.0–10.5)
nRBC: 0 % (ref 0.0–0.2)

## 2023-05-11 LAB — COMPREHENSIVE METABOLIC PANEL
ALT: 24 U/L (ref 0–44)
AST: 18 U/L (ref 15–41)
Albumin: 3.9 g/dL (ref 3.5–5.0)
Alkaline Phosphatase: 74 U/L (ref 38–126)
Anion gap: 10 (ref 5–15)
BUN: 12 mg/dL (ref 6–20)
CO2: 22 mmol/L (ref 22–32)
Calcium: 9 mg/dL (ref 8.9–10.3)
Chloride: 104 mmol/L (ref 98–111)
Creatinine, Ser: 0.7 mg/dL (ref 0.44–1.00)
GFR, Estimated: >60 mL/min (ref >60)
Glucose, Bld: 150 mg/dL — ABNORMAL HIGH (ref 70–99)
Potassium: 4.1 mmol/L (ref 3.5–5.1)
Sodium: 136 mmol/L (ref 135–145)
Total Bilirubin: 0.6 mg/dL (ref 0.3–1.2)
Total Protein: 7.4 g/dL (ref 6.5–8.1)

## 2023-05-11 LAB — URINALYSIS, ROUTINE W REFLEX MICROSCOPIC
Bilirubin Urine: NEGATIVE
Glucose, UA: NEGATIVE mg/dL
Hgb urine dipstick: NEGATIVE
Ketones, ur: NEGATIVE mg/dL
Leukocytes,Ua: NEGATIVE
Nitrite: NEGATIVE
Protein, ur: NEGATIVE mg/dL
Specific Gravity, Urine: >1.046 — ABNORMAL HIGH (ref 1.005–1.030)
pH: 6 (ref 5.0–8.0)

## 2023-05-11 LAB — LIPASE, BLOOD: Lipase: 18 U/L (ref 11–51)

## 2023-05-11 MED ORDER — IOHEXOL 300 MG/ML  SOLN
100.0000 mL | Freq: Once | INTRAMUSCULAR | Status: AC | PRN
  Administered 2023-05-11: 100 mL via INTRAVENOUS

## 2023-05-11 MED ORDER — ONDANSETRON HCL 4 MG/2ML IJ SOLN
4.0000 mg | Freq: Once | INTRAMUSCULAR | Status: AC
  Administered 2023-05-11: 4 mg via INTRAVENOUS
  Filled 2023-05-11: qty 2

## 2023-05-11 MED ORDER — DICYCLOMINE HCL 20 MG PO TABS: 20.0000 mg | ORAL_TABLET | Freq: Two times a day (BID) | ORAL | 0 refills | Status: DC

## 2023-05-11 MED ORDER — SODIUM CHLORIDE 0.9 % IV BOLUS
1000.0000 mL | Freq: Once | INTRAVENOUS | Status: AC
  Administered 2023-05-11: 1000 mL via INTRAVENOUS

## 2023-05-11 NOTE — ED Provider Notes (Signed)
[place] EMERGENCY DEPARTMENT AT [facility] Provider Note   CSN: [id number] Arrival date & time: 05/11/23  1401     History  Chief Complaint  Patient presents with   Abdominal Pain    Frances Graham is a 57 y.o. female.  58 year old female with prior medical history as detailed below presents for evaluation.   She reports being sick for "all of August". She reports body aches, fatigue, congestion. She reports "several days of green diarrhea". She reports that "something in my stomach folded over" this morning and causes abdominal pain.   No urinary symptoms. No vomiting.   No fever. No CP or SOB.  The history is provided by the patient and medical records.       Home Medications Prior to Admission medications   Medication Sig Start Date End Date Taking? Authorizing Provider  amphetamine-dextroamphetamine (ADDERALL) 20 MG tablet Take 20 mg by mouth daily as needed (attention/focus/ADD).    [provider]  dicyclomine (BENTYL) 20 MG tablet Take 1 tablet (20 mg total) by mouth 2 (two) times daily. 11/03/22   Derwood Kaplan, MD  HYDROcodone-acetaminophen [facility]) 10-325 MG tablet  07/19/20   [provider]  ibuprofen (ADVIL) 200 MG tablet Take 800 mg by mouth every 8 (eight) hours as needed (pain).    [provider]  methocarbamol (ROBAXIN) 500 MG tablet Take 1 tablet (500 mg total) by mouth every 8 (eight) hours as needed for muscle spasms. 01/22/20   Emelia Loron, MD  ondansetron (ZOFRAN) 8 MG tablet Take 8 mg by mouth every 8 (eight) hours as needed. 12/24/19   [provider]  predniSONE (DELTASONE) 20 MG tablet Take by mouth. 03/16/20   [provider]  SUMAtriptan (IMITREX) 100 MG tablet TAKE 1 TABLET BY MOUTH AS NEEDED FOR MIGRAINE AND REPEAT IN 2 HOURS. ONLY UP TO A MAX OF 2 PER DAY 12/31/19   [provider]  traZODone (DESYREL) 150 MG tablet Take by mouth at bedtime as needed for sleep.    [provider]      Allergies    Bee venom, Penicillins, Benadryl [diphenhydramine hcl], Celebrex [celecoxib], Cephalexin, Lidocaine, and Codeine    Review of Systems   Review of Systems  All other systems reviewed and are negative.   Physical Exam Updated Vital Signs BP (!) 132/96   Pulse 100   Temp 98.2 F (36.8 C) (Oral)   Resp 20   SpO2 95%  Physical Exam Vitals and nursing note reviewed.  Constitutional:      General: She is not in acute distress.    Appearance: Normal appearance. She is well-developed.  HENT:     Head: Normocephalic and atraumatic.  Eyes:     Conjunctiva/sclera: Conjunctivae normal.     Pupils: Pupils are equal, round, and reactive to light.  Cardiovascular:     Rate and Rhythm: Normal rate and regular rhythm.     Heart sounds: Normal heart sounds.  Pulmonary:     Effort: Pulmonary effort is normal. No respiratory distress.     Breath sounds: Normal breath sounds.  Abdominal:     General: There is no distension.     Palpations: Abdomen is soft.     Tenderness: There is abdominal tenderness.  Musculoskeletal:        General: No deformity. Normal range of motion.     Cervical back: Normal range of motion and neck supple.  Skin:    General: Skin is  warm and dry.  Neurological:     General: No focal deficit present.     Mental Status: She is alert and oriented to person, place, and time.     ED Results / Procedures / Treatments   Labs (all labs ordered are listed, but only abnormal results are displayed) Labs Reviewed  CBC WITH DIFFERENTIAL/PLATELET  COMPREHENSIVE METABOLIC PANEL  LIPASE, BLOOD  URINALYSIS, ROUTINE W REFLEX MICROSCOPIC    EKG None  Radiology No results found.  Procedures Procedures    Medications Ordered in ED Medications  sodium chloride 0.9 % bolus 1,000 mL (has no administration in time range)  ondansetron (ZOFRAN) injection 4 mg (has no administration in time range)    ED Course/ Medical Decision  Making/ A&P                                 Medical Decision Making Amount and/or Complexity of Data Reviewed Labs: ordered. Radiology: ordered.  Risk Prescription drug management.    Medical Screen Complete  This patient presented to the ED with complaint of diarrhea, abdominal pain.  This complaint involves an extensive number of treatment options. The initial differential diagnosis includes, but is not limited to, metabolic abnormality, colitis, etc.   This presentation is: Acute, Chronic, Self-Limited, Previously Undiagnosed, Uncertain Prognosis, Complicated, Systemic Symptoms, and Threat to Life/Bodily Function  Patient with multiple complaints - primary complaints are persistent diarrhea and abdominal pain.   Workup is reassuringly without significant abnormalities.  Patient reassured by lack of significant findings.   Patient comfortable with plan to discharge. Importance of close FU stressed. Strict return precautions given and understood.  Additional history obtained: External records from outside sources obtained and reviewed including prior ED visits and prior Inpatient records.    Problem List / ED Course:  Diarrhea    Reevaluation:  After the interventions noted above, I reevaluated the patient and found that they have: improved  Disposition:  After consideration of the diagnostic results and the patients response to treatment, I feel that the patent would benefit from close outpatient followup.          Final Clinical Impression(s) / ED Diagnoses Final diagnoses:  Diarrhea, unspecified type    Rx / DC Orders ED Discharge Orders     None         Wynetta Fines, MD 05/11/23 1905

## 2023-05-11 NOTE — Discharge Instructions (Signed)
Return for any problem.  ?

## 2023-05-11 NOTE — ED Triage Notes (Signed)
Pt arrived via POV, c/o abd pain, n/v for over 8 days. Diarrhea for several days, denies any blood but states it looks like biles when she does have a bowl movement.

## 2023-05-19 ENCOUNTER — Encounter: Payer: Self-pay | Admitting: Gastroenterology

## 2023-05-26 ENCOUNTER — Emergency Department (HOSPITAL_COMMUNITY)
Admission: EM | Admit: 2023-05-26 | Discharge: 2023-05-26 | Disposition: A | Discharge status: Home / Self Care | Payer: Medicaid Other | Attending: Emergency Medicine | Admitting: Emergency Medicine

## 2023-05-26 ENCOUNTER — Emergency Department (HOSPITAL_COMMUNITY): Payer: Medicaid Other

## 2023-05-26 ENCOUNTER — Other Ambulatory Visit: Payer: Self-pay

## 2023-05-26 ENCOUNTER — Encounter (HOSPITAL_COMMUNITY): Payer: Self-pay

## 2023-05-26 DIAGNOSIS — D72829 Elevated white blood cell count, unspecified: Secondary | ICD-10-CM | POA: Insufficient documentation

## 2023-05-26 DIAGNOSIS — I10 Essential (primary) hypertension: Secondary | ICD-10-CM | POA: Insufficient documentation

## 2023-05-26 DIAGNOSIS — R1084 Generalized abdominal pain: Secondary | ICD-10-CM

## 2023-05-26 DIAGNOSIS — R109 Unspecified abdominal pain: Secondary | ICD-10-CM | POA: Diagnosis present

## 2023-05-26 LAB — URINALYSIS, ROUTINE W REFLEX MICROSCOPIC
Bilirubin Urine: NEGATIVE
Glucose, UA: NEGATIVE mg/dL
Hgb urine dipstick: NEGATIVE
Ketones, ur: NEGATIVE mg/dL
Leukocytes,Ua: NEGATIVE
Nitrite: NEGATIVE
Protein, ur: NEGATIVE mg/dL
Specific Gravity, Urine: 1.02 (ref 1.005–1.030)
pH: 5 (ref 5.0–8.0)

## 2023-05-26 LAB — LIPASE, BLOOD: Lipase: 23 U/L (ref 11–51)

## 2023-05-26 LAB — COMPREHENSIVE METABOLIC PANEL
ALT: 17 U/L (ref 0–44)
AST: 16 U/L (ref 15–41)
Albumin: 3.8 g/dL (ref 3.5–5.0)
Alkaline Phosphatase: 92 U/L (ref 38–126)
Anion gap: 10 (ref 5–15)
BUN: 10 mg/dL (ref 6–20)
CO2: 24 mmol/L (ref 22–32)
Calcium: 9.2 mg/dL (ref 8.9–10.3)
Chloride: 104 mmol/L (ref 98–111)
Creatinine, Ser: 0.68 mg/dL (ref 0.44–1.00)
GFR, Estimated: >60 mL/min (ref >60)
Glucose, Bld: 110 mg/dL — ABNORMAL HIGH (ref 70–99)
Potassium: 4 mmol/L (ref 3.5–5.1)
Sodium: 138 mmol/L (ref 135–145)
Total Bilirubin: 0.4 mg/dL (ref 0.3–1.2)
Total Protein: 7.5 g/dL (ref 6.5–8.1)

## 2023-05-26 LAB — CBC
HCT: 44.6 % (ref 36.0–46.0)
Hemoglobin: 14.7 g/dL (ref 12.0–15.0)
MCH: 31.6 pg (ref 26.0–34.0)
MCHC: 33 g/dL (ref 30.0–36.0)
MCV: 95.9 fL (ref 80.0–100.0)
Platelets: 227 10*3/uL (ref 150–400)
RBC: 4.65 MIL/uL (ref 3.87–5.11)
RDW: 12.9 % (ref 11.5–15.5)
WBC: 12.3 10*3/uL — ABNORMAL HIGH (ref 4.0–10.5)
nRBC: 0 % (ref 0.0–0.2)

## 2023-05-26 LAB — HCG, SERUM, QUALITATIVE: Preg, Serum: NEGATIVE

## 2023-05-26 MED ORDER — IOHEXOL 300 MG/ML  SOLN
100.0000 mL | Freq: Once | INTRAMUSCULAR | Status: AC | PRN
  Administered 2023-05-26: 100 mL via INTRAVENOUS

## 2023-05-26 MED ORDER — KETOROLAC TROMETHAMINE 15 MG/ML IJ SOLN
15.0000 mg | Freq: Once | INTRAMUSCULAR | Status: AC
  Administered 2023-05-26: 15 mg via INTRAVENOUS
  Filled 2023-05-26: qty 1

## 2023-05-26 MED ORDER — ONDANSETRON HCL 4 MG/2ML IJ SOLN
4.0000 mg | Freq: Once | INTRAMUSCULAR | Status: AC
  Administered 2023-05-26: 4 mg via INTRAVENOUS
  Filled 2023-05-26: qty 2

## 2023-05-26 MED ORDER — HYDROCODONE-ACETAMINOPHEN 5-325 MG PO TABS
1.0000 | ORAL_TABLET | Freq: Once | ORAL | Status: AC
  Administered 2023-05-26: 1 via ORAL
  Filled 2023-05-26: qty 1

## 2023-05-26 NOTE — ED Triage Notes (Signed)
Patient has had lower abdominal pain under her belly button that moves to the left side for 2 days. Feels nauseous.

## 2023-05-26 NOTE — Discharge Instructions (Signed)
We evaluated you for your abdominal pain.  Your CT scan did not show any dangerous problems.  I would recommend continuing to follow-up with gastroenterology so that they can try to determine the cause of your pain.  If any of your symptoms worsen, please return to the emergency department.

## 2023-05-26 NOTE — ED Provider Notes (Signed)
[place] EMERGENCY DEPARTMENT AT [facility] Provider Note  CSN: [id number] Arrival date & time: 05/26/23 0623  Chief Complaint(s) Abdominal Pain  HPI AADVIKA Graham is a 57 y.o. female history of small bowel obstruction, peripheral vascular disease presenting to the emergency department with abdominal pain.  Patient reports abdominal pain in the left lower quadrant for 2 days with associated nausea, no vomiting, no fevers or chills.  She reports she has had some abdominal pain for the past almost year but this is worse.  No diarrhea, constipation.  No urinary symptoms.  No chest pain or shortness of breath.   Past Medical History Past Medical History:  Diagnosis Date   Anxiety    panic attacks- has then post op   Asthma    as a child   Attention deficit disorder    Essential hypertension    off  medications, has lost 69 lbs   Headache    History of kidney stones    passed   PAD (peripheral artery disease) (HCC)    Infrarenal aortic occlusion   Temporomandibular joint disorder    Patient Active Problem List   Diagnosis Date Noted   Incisional hernia 01/20/2020   SBO (small bowel obstruction) (HCC) 11/11/2018   Small bowel obstruction (HCC) 11/11/2018   Pre-operative clearance    Mixed hyperlipidemia    Peripheral vascular disease (HCC) 06/23/2016   Aortoiliac occlusive disease (HCC) 06/23/2016   Ischemic ulcer of foot (HCC) 06/23/2016   Cardiovascular risk factor 06/23/2016   Leukocytosis 06/23/2016   Tobacco abuse 06/23/2016   Chronic low back pain 06/23/2016   ADD (attention deficit disorder) 06/23/2016   Home Medication(s) Prior to Admission medications   Medication Sig Start Date End Date Taking? Authorizing Provider  amphetamine-dextroamphetamine (ADDERALL) 20 MG tablet Take 20 mg by mouth daily as needed (attention/focus/ADD).    [provider]  dicyclomine (BENTYL) 20 MG tablet Take 1 tablet (20 mg total) by mouth 2 (two) times daily.  05/11/23   Wynetta Fines, MD  ondansetron (ZOFRAN) 8 MG tablet Take 8 mg by mouth every 8 (eight) hours as needed. 12/24/19   [provider]  oxyCODONE-acetaminophen (PERCOCET) 10-325 MG tablet Take 1 tablet by mouth every 4 (four) hours as needed for pain.    [provider]  SUMAtriptan (IMITREX) 100 MG tablet TAKE 1 TABLET BY MOUTH AS NEEDED FOR MIGRAINE AND REPEAT IN 2 HOURS. ONLY UP TO A MAX OF 2 PER DAY 12/31/19   [provider]  traZODone (DESYREL) 150 MG tablet Take 150 mg by mouth at bedtime as needed for sleep.    [provider]                                                                                                                                    Past Surgical History Past Surgical History:  Procedure Laterality Date   AMPUTATION Right  08/01/2016   Procedure: AMPUTATION GREAT TOE;  Surgeon: Chuck Hint, MD;  Location: [facility] OR;  Service: Vascular;  Laterality: Right;   AMPUTATION TOE Right 08/01/2016   AORTA - BILATERAL FEMORAL ARTERY BYPASS GRAFT Bilateral 06/28/2016   Procedure: AORTOBIFEMORAL BYPASS GRAFT;  Surgeon: Chuck Hint, MD;  Location: [facility] OR;  Service: Vascular;  Laterality: Bilateral;   BIOPSY THYROID     INCISIONAL HERNIA REPAIR N/A 01/20/2020   Procedure: LAPAROSCOPIC INCISIONAL HERNIA REPAIR WITH MESH;  Surgeon: Abigail Miyamoto, MD;  Location: [facility] OR;  Service: General;  Laterality: N/A;   LEG SURGERY Left 1970   pin  to fix cogental hip   MENISCUS REPAIR Left    2015   TOOTH EXTRACTION Bilateral 10/23/2018   Procedure: DENTAL RESTORATION/EXTRACTIONS;  Surgeon: Ocie Doyne, DDS;  Location: [facility] OR;  Service: Oral Surgery;  Laterality: Bilateral;   Family History Family History  Problem Relation Age of Onset   COPD Mother    CAD Mother    Diabetes Maternal Uncle    CAD Maternal Grandmother    Breast cancer Paternal Grandmother        30s    Social History Social History   Tobacco Use   Smoking  status: Every Day    Current packs/day: 0.50    Average packs/day: 0.5 packs/day for 10.0 years (5.0 ttl pk-yrs)    Types: Cigarettes   Smokeless tobacco: Never  Vaping Use   Vaping status: Never Used  Substance Use Topics   Alcohol use: No   Drug use: No   Allergies Bee venom, Penicillins, Benadryl [diphenhydramine hcl], Celebrex [celecoxib], Cephalexin, Lidocaine, and Codeine  Review of Systems Review of Systems  All other systems reviewed and are negative.   Physical Exam Vital Signs  I have reviewed the triage vital signs BP (!) 136/101 (BP Location: Right Arm)   Pulse 85   Temp 97.8 F (36.6 C) (Oral)   Resp 18   Ht 5\' 7"  (1.702 m)   Wt 108 kg   SpO2 94%   BMI 37.29 kg/m  Physical Exam Vitals and nursing note reviewed.  Constitutional:      General: She is not in acute distress.    Appearance: She is well-developed.  HENT:     Head: Normocephalic and atraumatic.     Mouth/Throat:     Mouth: Mucous membranes are moist.  Eyes:     Pupils: Pupils are equal, round, and reactive to light.  Cardiovascular:     Rate and Rhythm: Normal rate and regular rhythm.     Heart sounds: No murmur heard. Pulmonary:     Effort: Pulmonary effort is normal. No respiratory distress.     Breath sounds: Normal breath sounds.  Abdominal:     General: Abdomen is flat.     Palpations: Abdomen is soft.     Tenderness: There is abdominal tenderness in the left lower quadrant.  Musculoskeletal:        General: No tenderness.     Right lower leg: No edema.     Left lower leg: No edema.  Skin:    General: Skin is warm and dry.  Neurological:     General: No focal deficit present.     Mental Status: She is alert. Mental status is at baseline.  Psychiatric:        Mood and Affect: Mood normal.        Behavior: Behavior normal.     ED Results and Treatments Labs (all  labs ordered are listed, but only abnormal results are displayed) Labs Reviewed  COMPREHENSIVE METABOLIC  PANEL - Abnormal; Notable for the following components:      Result Value   Glucose, Bld 110 (*)    All other components within normal limits  CBC - Abnormal; Notable for the following components:   WBC 12.3 (*)    All other components within normal limits  LIPASE, BLOOD  URINALYSIS, ROUTINE W REFLEX MICROSCOPIC  HCG, SERUM, QUALITATIVE                                                                                                                          Radiology CT ABDOMEN PELVIS W CONTRAST  Result Date: 05/26/2023 CLINICAL DATA:  Left lower quadrant pain for 2 days.  Nausea. EXAM: CT ABDOMEN AND PELVIS WITH CONTRAST TECHNIQUE: Multidetector CT imaging of the abdomen and pelvis was performed using the standard protocol following bolus administration of intravenous contrast. RADIATION DOSE REDUCTION: This exam was performed according to the departmental dose-optimization program which includes automated exposure control, adjustment of the mA and/or kV according to patient size and/or use of iterative reconstruction technique. CONTRAST:  OMNIPAQUE IOHEXOL 300 MG/ML  SOLN COMPARISON:  05/11/2023 FINDINGS: Lower chest: Clear lung bases. Normal heart size without pericardial or pleural effusion. Hepatobiliary: Mild nonspecific caudate lobe enlargement. 1.0 cm gallstone without acute cholecystitis or biliary duct dilatation. Pancreas: Normal, without mass or ductal dilatation. Spleen: Normal in size, without focal abnormality. Adrenals/Urinary Tract: Normal adrenal glands. Normal kidneys, without hydronephrosis. Normal urinary bladder. Stomach/Bowel: Normal stomach, without wall thickening. Transverse colon again positioned within an area of mild ventral abdominal wall laxity. Normal terminal ileum and appendix. Normal small bowel. Vascular/Lymphatic: Advanced aortic and branch vessel atherosclerosis. Aortic to bifem bypass again identified. No abdominopelvic adenopathy. Reproductive: Normal uterus  and adnexa. Other: No significant free fluid. Mild pelvic floor laxity. No free intraperitoneal air. Musculoskeletal: Degenerative disc disease at the lumbosacral junction. IMPRESSION: 1. No acute process or explanation for left lower quadrant pain. 2. Cholelithiasis. 3. Aortic to bifem bypass, without acute complication. 4.  Aortic Atherosclerosis (ICD10-I70.0). Electronically Signed   By: Jeronimo Greaves M.D.   On: 05/26/2023 12:02    Pertinent labs & imaging results that were available during my care of the patient were reviewed by me and considered in my medical decision making (see MDM for details).  Medications Ordered in ED Medications  HYDROcodone-acetaminophen (NORCO/VICODIN) 5-325 MG per tablet 1 tablet (has no administration in time range)  ondansetron (ZOFRAN) injection 4 mg (4 mg Intravenous Given 05/26/23 1126)  ketorolac (TORADOL) 15 MG/ML injection 15 mg (15 mg Intravenous Given 05/26/23 1127)  iohexol (OMNIPAQUE) 300 MG/ML solution 100 mL (100 mLs Intravenous Contrast Given 05/26/23 1134)  Procedures Procedures  (including critical care time)  Medical Decision Making / ED Course   MDM:  57 year old presenting to the emergency department abdominal pain.  Patient overall well-appearing, physical exam with some left lower quadrant tenderness to palpation.  Otherwise with mild tachycardia.  Will obtain CT scan to evaluate for acute intra-abdominal process such as diverticulitis, colitis, obstruction, perforation, abscess or other acute process.  Also check urinalysis given lower abdominal pain although patient denies any urinary symptoms.  Will treat symptoms and reassess.  Clinical Course as of 05/26/23 1306  Mon May 26, 2023  1301 CT scan without any evidence of acute process.  Recommended continue to follow-up with GI with her chronic abdominal pain.  Will discharge patient to home. All questions answered. Patient comfortable with plan of discharge. Return precautions discussed with patient and specified on the after visit summary.  [WS]    Clinical Course User Index [WS] Lonell Grandchild, MD     Additional history obtained:  -External records from outside source obtained and reviewed including: Chart review including previous notes, labs, imaging, consultation notes including previous ER visits for similar     Lab Tests: -I ordered, reviewed, and interpreted labs.   The pertinent results include:   Labs Reviewed  COMPREHENSIVE METABOLIC PANEL - Abnormal; Notable for the following components:      Result Value   Glucose, Bld 110 (*)    All other components within normal limits  CBC - Abnormal; Notable for the following components:   WBC 12.3 (*)    All other components within normal limits  LIPASE, BLOOD  URINALYSIS, ROUTINE W REFLEX MICROSCOPIC  HCG, SERUM, QUALITATIVE    Notable for nonspecific leukocytosis. Normal LFTs and lipase  Imaging Studies ordered: I ordered imaging studies including CT abdomen On my interpretation imaging demonstrates no acute process I independently visualized and interpreted imaging. I agree with the radiologist interpretation   Medicines ordered and prescription drug management: Meds ordered this encounter  Medications   ondansetron (ZOFRAN) injection 4 mg   ketorolac (TORADOL) 15 MG/ML injection 15 mg   iohexol (OMNIPAQUE) 300 MG/ML solution 100 mL   HYDROcodone-acetaminophen (NORCO/VICODIN) 5-325 MG per tablet 1 tablet    -I have reviewed the patients home medicines and have made adjustments as needed  Social Determinants of Health:  Diagnosis or treatment significantly limited by social determinants of health: obesity   Reevaluation: After the interventions noted above, I reevaluated the patient and found that their symptoms have improved  Co morbidities that complicate the  patient evaluation  Past Medical History:  Diagnosis Date   Anxiety    panic attacks- has then post op   Asthma    as a child   Attention deficit disorder    Essential hypertension    off  medications, has lost 69 lbs   Headache    History of kidney stones    passed   PAD (peripheral artery disease) (HCC)    Infrarenal aortic occlusion   Temporomandibular joint disorder       Dispostion: Disposition decision including need for hospitalization was considered, and patient discharged from emergency department.    Final Clinical Impression(s) / ED Diagnoses Final diagnoses:  Generalized abdominal pain     This chart was dictated using voice recognition software.  Despite best efforts to proofread,  errors can occur which can change the documentation meaning.    Lonell Grandchild, MD 05/26/23 [phone]

## 2023-07-08 ENCOUNTER — Ambulatory Visit: Payer: Medicaid Other | Admitting: Gastroenterology

## 2023-11-03 ENCOUNTER — Emergency Department (HOSPITAL_COMMUNITY)
Admission: EM | Admit: 2023-11-03 | Discharge: 2023-11-04 | Discharge status: Against Medical Advice | Payer: Medicaid Other | Attending: Emergency Medicine | Admitting: Emergency Medicine

## 2023-11-03 ENCOUNTER — Encounter (HOSPITAL_COMMUNITY): Payer: Self-pay

## 2023-11-03 ENCOUNTER — Other Ambulatory Visit: Payer: Self-pay

## 2023-11-03 DIAGNOSIS — Z5321 Procedure and treatment not carried out due to patient leaving prior to being seen by health care provider: Secondary | ICD-10-CM | POA: Insufficient documentation

## 2023-11-03 DIAGNOSIS — R1030 Lower abdominal pain, unspecified: Secondary | ICD-10-CM | POA: Diagnosis present

## 2023-11-03 DIAGNOSIS — R110 Nausea: Secondary | ICD-10-CM | POA: Diagnosis not present

## 2023-11-03 LAB — CBC
HCT: 43.3 % (ref 36.0–46.0)
Hemoglobin: 13.9 g/dL (ref 12.0–15.0)
MCH: 31.4 pg (ref 26.0–34.0)
MCHC: 32.1 g/dL (ref 30.0–36.0)
MCV: 98 fL (ref 80.0–100.0)
Platelets: 232 10*3/uL (ref 150–400)
RBC: 4.42 MIL/uL (ref 3.87–5.11)
RDW: 13.2 % (ref 11.5–15.5)
WBC: 10.7 10*3/uL — ABNORMAL HIGH (ref 4.0–10.5)
nRBC: 0 % (ref 0.0–0.2)

## 2023-11-03 LAB — COMPREHENSIVE METABOLIC PANEL
ALT: 13 U/L (ref 0–44)
AST: 18 U/L (ref 15–41)
Albumin: 3.7 g/dL (ref 3.5–5.0)
Alkaline Phosphatase: 73 U/L (ref 38–126)
Anion gap: 10 (ref 5–15)
BUN: 11 mg/dL (ref 6–20)
CO2: 23 mmol/L (ref 22–32)
Calcium: 8.7 mg/dL — ABNORMAL LOW (ref 8.9–10.3)
Chloride: 102 mmol/L (ref 98–111)
Creatinine, Ser: 0.67 mg/dL (ref 0.44–1.00)
GFR, Estimated: >60 mL/min (ref >60)
Glucose, Bld: 99 mg/dL (ref 70–99)
Potassium: 4.2 mmol/L (ref 3.5–5.1)
Sodium: 135 mmol/L (ref 135–145)
Total Bilirubin: 0.5 mg/dL (ref 0.0–1.2)
Total Protein: 6.9 g/dL (ref 6.5–8.1)

## 2023-11-03 LAB — HCG, SERUM, QUALITATIVE: Preg, Serum: NEGATIVE

## 2023-11-03 LAB — LIPASE, BLOOD: Lipase: 21 U/L (ref 11–51)

## 2023-11-03 NOTE — ED Triage Notes (Signed)
 Lower abdominal pain for a few weeks. Pt states it is progressively getting worse. Intermittent nausea. No diarrhea or vomiting. No issues with urination.

## 2023-11-06 ENCOUNTER — Ambulatory Visit
Admission: EM | Admit: 2023-11-06 | Discharge: 2023-11-06 | Disposition: A | Discharge status: Home / Self Care | Payer: Medicaid Other

## 2023-11-06 DIAGNOSIS — R1030 Lower abdominal pain, unspecified: Secondary | ICD-10-CM

## 2023-11-06 NOTE — ED Provider Notes (Addendum)
[name]    CSN: [id number] Arrival date & time: 11/06/23  4098      History   Chief Complaint Chief Complaint  Patient presents with   Abdominal Pain    HPI Frances Graham is a 58 y.o. female.   Patient here with a 3-day history of abdominal pain.  Patient initially presented to the emergency department on 11/03/2023 due to lower right abdominal pain however left after a 4-hour wait and she had not been evaluated by provider.  Patient has a history of a small bowel obstruction and currently has her appendix.  She has not had a fever but endorses that when she eats it causes the pain to become more severe and when she lays on her left side the pain on the right side intensifies.  Symptoms have not improved over the course of the last 3 days.  Here for evaluation. Past Medical History:  Diagnosis Date   Anxiety    panic attacks- has then post op   Asthma    as a child   Attention deficit disorder    Essential hypertension    off  medications, has lost 69 lbs   Headache    History of kidney stones    passed   PAD (peripheral artery disease) (HCC)    Infrarenal aortic occlusion   Temporomandibular joint disorder     Patient Active Problem List   Diagnosis Date Noted   Incisional hernia 01/20/2020   SBO (small bowel obstruction) (HCC) 11/11/2018   Small bowel obstruction (HCC) 11/11/2018   Pre-operative clearance    Mixed hyperlipidemia    Peripheral vascular disease (HCC) 06/23/2016   Aortoiliac occlusive disease (HCC) 06/23/2016   Ischemic ulcer of foot (HCC) 06/23/2016   Cardiovascular risk factor 06/23/2016   Leukocytosis 06/23/2016   Tobacco abuse 06/23/2016   Chronic low back pain 06/23/2016   ADD (attention deficit disorder) 06/23/2016    Past Surgical History:  Procedure Laterality Date   AMPUTATION Right 08/01/2016   Procedure: AMPUTATION GREAT TOE;  Surgeon: Chuck Hint, MD;  Location: [facility] OR;  Service: Vascular;  Laterality:  Right;   AMPUTATION TOE Right 08/01/2016   AORTA - BILATERAL FEMORAL ARTERY BYPASS GRAFT Bilateral 06/28/2016   Procedure: AORTOBIFEMORAL BYPASS GRAFT;  Surgeon: Chuck Hint, MD;  Location: [facility] OR;  Service: Vascular;  Laterality: Bilateral;   BIOPSY THYROID     INCISIONAL HERNIA REPAIR N/A 01/20/2020   Procedure: LAPAROSCOPIC INCISIONAL HERNIA REPAIR WITH MESH;  Surgeon: Abigail Miyamoto, MD;  Location: [facility] OR;  Service: General;  Laterality: N/A;   LEG SURGERY Left 1970   pin  to fix cogental hip   MENISCUS REPAIR Left    2015   TOOTH EXTRACTION Bilateral 10/23/2018   Procedure: DENTAL RESTORATION/EXTRACTIONS;  Surgeon: Ocie Doyne, DDS;  Location: [facility] OR;  Service: Oral Surgery;  Laterality: Bilateral;    OB History   No obstetric history on file.      Home Medications    Prior to Admission medications   Medication Sig Start Date End Date Taking? Authorizing Provider  amphetamine-dextroamphetamine (ADDERALL) 20 MG tablet Take 20 mg by mouth daily as needed (attention/focus/ADD).    [provider]  dicyclomine (BENTYL) 20 MG tablet Take 1 tablet (20 mg total) by mouth 2 (two) times daily. 05/11/23   Wynetta Fines, MD  ondansetron (ZOFRAN) 8 MG tablet Take 8 mg by mouth every 8 (eight) hours as needed. 12/24/19   [provider]  oxyCODONE-acetaminophen (PERCOCET) 10-325 MG tablet Take 1 tablet by mouth every 4 (four) hours as needed for pain.    [provider]  SUMAtriptan (IMITREX) 100 MG tablet TAKE 1 TABLET BY MOUTH AS NEEDED FOR MIGRAINE AND REPEAT IN 2 HOURS. ONLY UP TO A MAX OF 2 PER DAY 12/31/19   [provider]  traZODone (DESYREL) 150 MG tablet Take 150 mg by mouth at bedtime as needed for sleep.    [provider]    Family History Family History  Problem Relation Age of Onset   COPD Mother    CAD Mother    Diabetes Maternal Uncle    CAD Maternal Grandmother    Breast cancer Paternal Grandmother        30s     Social History Social History   Tobacco Use   Smoking status: Every Day    Current packs/day: 0.50    Average packs/day: 0.5 packs/day for 10.0 years (5.0 ttl pk-yrs)    Types: Cigarettes   Smokeless tobacco: Never  Vaping Use   Vaping status: Never Used  Substance Use Topics   Alcohol use: No   Drug use: No     Allergies   Bee venom, Penicillin g, Penicillins, Yellow jacket venom, Benadryl [diphenhydramine hcl], Celecoxib, Cephalexin, Lidocaine, and Codeine   Review of Systems Review of Systems  Gastrointestinal:  Positive for abdominal pain.     Physical Exam Triage Vital Signs ED Triage Vitals  Encounter Vitals Group     BP 11/06/23 1014 (!) 150/86     Systolic BP Percentile --      Diastolic BP Percentile --      Pulse Rate 11/06/23 1014 85     Resp 11/06/23 1014 18     Temp 11/06/23 1014 98.1 F (36.7 C)     Temp src --      SpO2 11/06/23 1014 93 %     Weight --      Height --      Head Circumference --      Peak Flow --      Pain Score 11/06/23 1011 5     Pain Loc --      Pain Education --      Exclude from Growth Chart --    No data found.  Updated Vital Signs BP (!) 150/86 (BP Location: Left Arm)   Pulse 85   Temp 98.1 F (36.7 C)   Resp 18   SpO2 93%   Visual Acuity Right Eye Distance:   Left Eye Distance:   Bilateral Distance:    Right Eye Near:   Left Eye Near:    Bilateral Near:     Physical Exam Constitutional:      Appearance: She is obese.  Cardiovascular:     Rate and Rhythm: Normal rate and regular rhythm.  Pulmonary:     Effort: Pulmonary effort is normal.  Abdominal:     Tenderness: There is abdominal tenderness in the right lower quadrant and suprapubic area.      UC Treatments / Results  Labs (all labs ordered are listed, but only abnormal results are displayed) Labs Reviewed - No data to display  EKG   Radiology No results found.  Procedures Procedures (including critical care time)  Medications  Ordered in UC Medications - No data to display  Initial Impression / Assessment and Plan / UC Course  I have reviewed the triage vital signs and the nursing notes.  Pertinent labs &  imaging results that were available during my care of the patient were reviewed by me and considered in my medical decision making (see chart for details).    Given 3 days of persistent lower right abdominal pain symptoms patient warrants further workup and evaluation including advanced diagnostic testing which is not available in the setting of urgent care.  Patient has been directed to return to the emergency department for further workup and evaluation.  A list of [place] local emergency departments was provided to the patient and reports she will go either to [facility] or [facility]. Final Clinical Impressions(s) / UC Diagnoses   Final diagnoses:  Lower abdominal pain, right      Discharge Instructions      Go immediately to the Emergency department for further work-up of symptoms.     ED Prescriptions   None    PDMP not reviewed this encounter.   Bing Neighbors, NP 11/06/23 1105    Bing Neighbors, NP 11/06/23 1105

## 2023-11-06 NOTE — ED Triage Notes (Signed)
Pt states the pain is above pelvis and extend from navel to hip joint on the right. Eating makes it worse and walking or moving around in general. Laying on left side also causes more pain. Symptoms onset about 3 weeks and is getting progressively worse. Pt went to ER 11/03/2023 but says wait was to long. She had an ECG and blood work taken but then left facility.

## 2023-11-06 NOTE — Discharge Instructions (Addendum)
Go immediately to the Emergency department for further work-up of symptoms.

## 2023-11-10 ENCOUNTER — Emergency Department (HOSPITAL_BASED_OUTPATIENT_CLINIC_OR_DEPARTMENT_OTHER)
Admission: EM | Admit: 2023-11-10 | Discharge: 2023-11-10 | Discharge status: Against Medical Advice | Payer: Medicaid Other | Attending: Emergency Medicine | Admitting: Emergency Medicine

## 2023-11-10 ENCOUNTER — Other Ambulatory Visit: Payer: Self-pay

## 2023-11-10 ENCOUNTER — Emergency Department (HOSPITAL_BASED_OUTPATIENT_CLINIC_OR_DEPARTMENT_OTHER): Payer: Medicaid Other

## 2023-11-10 ENCOUNTER — Encounter (HOSPITAL_BASED_OUTPATIENT_CLINIC_OR_DEPARTMENT_OTHER): Payer: Self-pay | Admitting: Emergency Medicine

## 2023-11-10 DIAGNOSIS — R1030 Lower abdominal pain, unspecified: Secondary | ICD-10-CM

## 2023-11-10 DIAGNOSIS — R1032 Left lower quadrant pain: Secondary | ICD-10-CM | POA: Diagnosis not present

## 2023-11-10 DIAGNOSIS — M545 Low back pain, unspecified: Secondary | ICD-10-CM | POA: Insufficient documentation

## 2023-11-10 DIAGNOSIS — R1031 Right lower quadrant pain: Secondary | ICD-10-CM | POA: Diagnosis present

## 2023-11-10 DIAGNOSIS — R1033 Periumbilical pain: Secondary | ICD-10-CM | POA: Diagnosis not present

## 2023-11-10 LAB — CBC WITH DIFFERENTIAL/PLATELET
Abs Immature Granulocytes: 0.03 10*3/uL (ref 0.00–0.07)
Basophils Absolute: 0 10*3/uL (ref 0.0–0.1)
Basophils Relative: 0 %
Eosinophils Absolute: 0.2 10*3/uL (ref 0.0–0.5)
Eosinophils Relative: 2 %
HCT: 47.5 % — ABNORMAL HIGH (ref 36.0–46.0)
Hemoglobin: 15.8 g/dL — ABNORMAL HIGH (ref 12.0–15.0)
Immature Granulocytes: 0 %
Lymphocytes Relative: 28 %
Lymphs Abs: 2.6 10*3/uL (ref 0.7–4.0)
MCH: 31.9 pg (ref 26.0–34.0)
MCHC: 33.3 g/dL (ref 30.0–36.0)
MCV: 95.8 fL (ref 80.0–100.0)
Monocytes Absolute: 0.8 10*3/uL (ref 0.1–1.0)
Monocytes Relative: 8 %
Neutro Abs: 5.9 10*3/uL (ref 1.7–7.7)
Neutrophils Relative %: 62 %
Platelets: 253 10*3/uL (ref 150–400)
RBC: 4.96 MIL/uL (ref 3.87–5.11)
RDW: 13.2 % (ref 11.5–15.5)
WBC: 9.5 10*3/uL (ref 4.0–10.5)
nRBC: 0 % (ref 0.0–0.2)

## 2023-11-10 LAB — URINALYSIS, ROUTINE W REFLEX MICROSCOPIC
Bacteria, UA: NONE SEEN
Bilirubin Urine: NEGATIVE
Glucose, UA: NEGATIVE mg/dL
Hgb urine dipstick: NEGATIVE
Ketones, ur: NEGATIVE mg/dL
Leukocytes,Ua: NEGATIVE
Nitrite: NEGATIVE
Protein, ur: NEGATIVE mg/dL
Specific Gravity, Urine: 1.025 (ref 1.005–1.030)
pH: 5.5 (ref 5.0–8.0)

## 2023-11-10 LAB — COMPREHENSIVE METABOLIC PANEL
ALT: 11 U/L (ref 0–44)
AST: 14 U/L — ABNORMAL LOW (ref 15–41)
Albumin: 4.4 g/dL (ref 3.5–5.0)
Alkaline Phosphatase: 76 U/L (ref 38–126)
Anion gap: 9 (ref 5–15)
BUN: 14 mg/dL (ref 6–20)
CO2: 25 mmol/L (ref 22–32)
Calcium: 9.1 mg/dL (ref 8.9–10.3)
Chloride: 103 mmol/L (ref 98–111)
Creatinine, Ser: 0.73 mg/dL (ref 0.44–1.00)
GFR, Estimated: >60 mL/min (ref >60)
Glucose, Bld: 100 mg/dL — ABNORMAL HIGH (ref 70–99)
Potassium: 4.6 mmol/L (ref 3.5–5.1)
Sodium: 137 mmol/L (ref 135–145)
Total Bilirubin: 0.4 mg/dL (ref 0.0–1.2)
Total Protein: 7.6 g/dL (ref 6.5–8.1)

## 2023-11-10 LAB — LACTIC ACID, PLASMA: Lactic Acid, Venous: 1.1 mmol/L (ref 0.5–1.9)

## 2023-11-10 LAB — LIPASE, BLOOD: Lipase: 14 U/L (ref 11–51)

## 2023-11-10 MED ORDER — IOHEXOL 300 MG/ML  SOLN
100.0000 mL | Freq: Once | INTRAMUSCULAR | Status: AC | PRN
  Administered 2023-11-10: 100 mL via INTRAVENOUS

## 2023-11-10 MED ORDER — HYDROMORPHONE HCL 1 MG/ML IJ SOLN
1.0000 mg | Freq: Once | INTRAMUSCULAR | Status: AC
  Administered 2023-11-10: 1 mg via INTRAVENOUS
  Filled 2023-11-10: qty 1

## 2023-11-10 MED ORDER — ONDANSETRON HCL 4 MG/2ML IJ SOLN
4.0000 mg | Freq: Once | INTRAMUSCULAR | Status: AC
  Administered 2023-11-10: 4 mg via INTRAVENOUS
  Filled 2023-11-10: qty 2

## 2023-11-10 NOTE — ED Triage Notes (Signed)
 Pt c/o RT lower ABD pain x 3 weeks. Also reports RT lower back pain. Denies fever. Pain worse when eating, walking or lying on LT side

## 2023-11-10 NOTE — ED Provider Notes (Addendum)
 [place] EMERGENCY DEPARTMENT AT [facility] Provider Note   CSN: [id number] Arrival date & time: 11/10/23  0932     History  Chief Complaint  Patient presents with   Abdominal Pain    Frances Graham is a 58 y.o. female.  Patient with history of chronic pain, previous aortobifemoral bypass surgery, history of bowel obstruction/hernia surgery --presents to the emergency department today for evaluation of lower abdominal pain that has been ongoing over the past 3 weeks.  Patient has had several visits for abdominal pain in the past but states that current pain is lower than what she typically feels, more in the suprapubic area.  Pain is made worse with eating and walking.  It hurts when she lies on her left side.  She does not report any fevers, chest pain, shortness of breath, vomiting.  She has been nauseous.  No diarrhea or constipation.  No blood in the stool.  No urinary symptoms or dysuria noted.  States she tried to go to the [facility] ED a few days ago but left without being seen.  She was referred to the emergency department by urgent care.       Home Medications Prior to Admission medications   Medication Sig Start Date End Date Taking? Authorizing Provider  amphetamine-dextroamphetamine (ADDERALL) 20 MG tablet Take 20 mg by mouth daily as needed (attention/focus/ADD).    [provider]  dicyclomine (BENTYL) 20 MG tablet Take 1 tablet (20 mg total) by mouth 2 (two) times daily. 05/11/23   Wynetta Fines, MD  ondansetron (ZOFRAN) 8 MG tablet Take 8 mg by mouth every 8 (eight) hours as needed. 12/24/19   [provider]  oxyCODONE-acetaminophen (PERCOCET) 10-325 MG tablet Take 1 tablet by mouth every 4 (four) hours as needed for pain.    [provider]  SUMAtriptan (IMITREX) 100 MG tablet TAKE 1 TABLET BY MOUTH AS NEEDED FOR MIGRAINE AND REPEAT IN 2 HOURS. ONLY UP TO A MAX OF 2 PER DAY 12/31/19   [provider]  traZODone  (DESYREL) 150 MG tablet Take 150 mg by mouth at bedtime as needed for sleep.    [provider]      Allergies    Bee venom, Penicillin g, Penicillins, Yellow jacket venom, Benadryl [diphenhydramine hcl], Celecoxib, Cephalexin, Lidocaine, and Codeine    Review of Systems   Review of Systems  Physical Exam Updated Vital Signs BP (!) 153/110   Pulse 92   Temp 98.1 F (36.7 C) (Oral)   Resp 20   SpO2 98%   Physical Exam Vitals and nursing note reviewed.  Constitutional:      General: She is not in acute distress.    Appearance: She is well-developed.  HENT:     Head: Normocephalic and atraumatic.     Right Ear: External ear normal.     Left Ear: External ear normal.     Nose: Nose normal.  Eyes:     Conjunctiva/sclera: Conjunctivae normal.  Cardiovascular:     Rate and Rhythm: Normal rate and regular rhythm.     Heart sounds: No murmur heard. Pulmonary:     Effort: No respiratory distress.     Breath sounds: No wheezing, rhonchi or rales.  Abdominal:     Palpations: Abdomen is soft.     Tenderness: There is abdominal tenderness in the right lower quadrant, periumbilical area, suprapubic area and left lower quadrant. There is no guarding or rebound. Negative signs include Murphy's sign,  Rovsing's sign and McBurney's sign.  Musculoskeletal:     Cervical back: Normal range of motion and neck supple.     Right lower leg: No edema.     Left lower leg: No edema.  Skin:    General: Skin is warm and dry.     Findings: No rash.  Neurological:     General: No focal deficit present.     Mental Status: She is alert. Mental status is at baseline.     Motor: No weakness.  Psychiatric:        Mood and Affect: Mood normal.     ED Results / Procedures / Treatments   Labs (all labs ordered are listed, but only abnormal results are displayed) Labs Reviewed  CBC WITH DIFFERENTIAL/PLATELET - Abnormal; Notable for the following components:      Result Value   Hemoglobin  15.8 (*)    HCT 47.5 (*)    All other components within normal limits  COMPREHENSIVE METABOLIC PANEL - Abnormal; Notable for the following components:   Glucose, Bld 100 (*)    AST 14 (*)    All other components within normal limits  LIPASE, BLOOD  URINALYSIS, ROUTINE W REFLEX MICROSCOPIC  LACTIC ACID, PLASMA    EKG None  Radiology No results found.  Procedures Procedures    Medications Ordered in ED Medications  HYDROmorphone (DILAUDID) injection 1 mg (1 mg Intravenous Given 11/10/23 1023)  ondansetron (ZOFRAN) injection 4 mg (4 mg Intravenous Given 11/10/23 1021)  iohexol (OMNIPAQUE) 300 MG/ML solution 100 mL (100 mLs Intravenous Contrast Given 11/10/23 1231)    ED Course/ Medical Decision Making/ A&P    Patient seen and examined. History obtained directly from patient.   Labs/EKG: Ordered CBC, CMP, lipase, UA, lactate  Imaging: Will plan on CT imaging of the abdomen and pelvis  Medications/Fluids: Ordered: Dilaudid, Zofran.   Most recent vital signs reviewed and are as follows: BP (!) 153/110   Pulse 92   Temp 98.1 F (36.7 C) (Oral)   Resp 20   SpO2 98%   Initial impression: Abdominal pain.  Patient tells me that today's pain is different than what she typically feels.  She has had a significant vascular and abdominal surgery history.  Last several CT scans in the past 1 to 2 years have largely been negative.  However, given her description of pain, will unlikely be able to rule out serious etiology given lab work alone.  We discussed reimaging, patient agrees to proceed.  2:55 PM Reassessment performed. Patient appears comfortable.  She is sitting at bedside, requesting discharge.  Labs personally reviewed and interpreted including: CBC with normal white blood cell count, hemoglobin minimally elevated; CMP unremarkable; lipase normal; UA unremarkable.  Imaging personally visualized and interpreted including: CT scan, pending results  Reviewed pertinent lab  work and imaging with patient at bedside. Questions answered.   Most current vital signs reviewed and are as follows: BP 119/73   Pulse 74   Temp 98.1 F (36.7 C) (Oral)   Resp 16   SpO2 95%   Plan: Patient needs to leave to pick up her child.  She was informed that CT scan has not yet resulted.  She cannot stay any longer for results.  She asked me to call her with any significant results and she would "come right back".  Will follow-up.  Discussed that I cannot assure that she does not have any emergent or life-threatening conditions at this time.  Did discuss that lab  workup is overall reassuring.  3:45 PM CT resulted and reviewed.  No acute localizing findings.  Patient had does have a gallstone without signs of cholecystitis.  This has been present on previous CTs.                                Medical Decision Making Amount and/or Complexity of Data Reviewed Labs: ordered. Radiology: ordered.  Risk Prescription drug management.   For this patient's complaint of abdominal pain, the following conditions were considered on the differential diagnosis: gastritis/PUD, enteritis/duodenitis, appendicitis, cholelithiasis/cholecystitis, cholangitis, pancreatitis, ruptured viscus, colitis, diverticulitis, small/large bowel obstruction, proctitis, cystitis, pyelonephritis, ureteral colic, aortic dissection, aortic aneurysm. In women, ectopic pregnancy, pelvic inflammatory disease, ovarian cysts, and tubo-ovarian abscess were also considered. Atypical chest etiologies were also considered including ACS, PE, and pneumonia.         Final Clinical Impression(s) / ED Diagnoses Final diagnoses:  Lower abdominal pain    Rx / DC Orders ED Discharge Orders     None         Renne Crigler, PA-C 11/10/23 1457    Renne Crigler, PA-C 11/10/23 1545    Ernie Avena, MD 11/10/23 1550

## 2023-11-10 NOTE — ED Notes (Signed)
 Pt ambulatory, CA&Ox4, and in NAD at time of AMA dispo.

## 2023-11-10 NOTE — ED Notes (Signed)
 Pt refused to sign AMA paperwork and stated she had to leave at this time. EDP notified and spoke to pt prior to pt leaving.

## 2024-09-30 ENCOUNTER — Encounter (HOSPITAL_COMMUNITY): Payer: Self-pay | Admitting: Emergency Medicine

## 2024-09-30 ENCOUNTER — Inpatient Hospital Stay (HOSPITAL_COMMUNITY)
Admission: EM | Admit: 2024-09-30 | Discharge: 2024-10-02 | DRG: 282 | Disposition: A | Attending: Internal Medicine | Admitting: Internal Medicine

## 2024-09-30 ENCOUNTER — Emergency Department (HOSPITAL_COMMUNITY)

## 2024-09-30 ENCOUNTER — Other Ambulatory Visit: Payer: Self-pay

## 2024-09-30 DIAGNOSIS — I2510 Atherosclerotic heart disease of native coronary artery without angina pectoris: Secondary | ICD-10-CM | POA: Diagnosis present

## 2024-09-30 DIAGNOSIS — I214 Non-ST elevation (NSTEMI) myocardial infarction: Principal | ICD-10-CM | POA: Diagnosis present

## 2024-09-30 DIAGNOSIS — Z7902 Long term (current) use of antithrombotics/antiplatelets: Secondary | ICD-10-CM

## 2024-09-30 DIAGNOSIS — F17210 Nicotine dependence, cigarettes, uncomplicated: Secondary | ICD-10-CM | POA: Diagnosis present

## 2024-09-30 DIAGNOSIS — Z803 Family history of malignant neoplasm of breast: Secondary | ICD-10-CM

## 2024-09-30 DIAGNOSIS — Z888 Allergy status to other drugs, medicaments and biological substances status: Secondary | ICD-10-CM

## 2024-09-30 DIAGNOSIS — Z8249 Family history of ischemic heart disease and other diseases of the circulatory system: Secondary | ICD-10-CM

## 2024-09-30 DIAGNOSIS — R7989 Other specified abnormal findings of blood chemistry: Secondary | ICD-10-CM | POA: Diagnosis present

## 2024-09-30 DIAGNOSIS — J45909 Unspecified asthma, uncomplicated: Secondary | ICD-10-CM | POA: Diagnosis present

## 2024-09-30 DIAGNOSIS — Z7982 Long term (current) use of aspirin: Secondary | ICD-10-CM

## 2024-09-30 DIAGNOSIS — Z8679 Personal history of other diseases of the circulatory system: Secondary | ICD-10-CM

## 2024-09-30 DIAGNOSIS — Z716 Tobacco abuse counseling: Secondary | ICD-10-CM

## 2024-09-30 DIAGNOSIS — F411 Generalized anxiety disorder: Secondary | ICD-10-CM | POA: Diagnosis present

## 2024-09-30 DIAGNOSIS — Z833 Family history of diabetes mellitus: Secondary | ICD-10-CM

## 2024-09-30 DIAGNOSIS — I10 Essential (primary) hypertension: Secondary | ICD-10-CM | POA: Diagnosis present

## 2024-09-30 DIAGNOSIS — Z885 Allergy status to narcotic agent status: Secondary | ICD-10-CM

## 2024-09-30 DIAGNOSIS — F988 Other specified behavioral and emotional disorders with onset usually occurring in childhood and adolescence: Secondary | ICD-10-CM | POA: Diagnosis present

## 2024-09-30 DIAGNOSIS — Z79899 Other long term (current) drug therapy: Secondary | ICD-10-CM

## 2024-09-30 DIAGNOSIS — Z880 Allergy status to penicillin: Secondary | ICD-10-CM

## 2024-09-30 DIAGNOSIS — I739 Peripheral vascular disease, unspecified: Secondary | ICD-10-CM | POA: Diagnosis present

## 2024-09-30 DIAGNOSIS — Z91030 Bee allergy status: Secondary | ICD-10-CM

## 2024-09-30 DIAGNOSIS — Z886 Allergy status to analgesic agent status: Secondary | ICD-10-CM

## 2024-09-30 DIAGNOSIS — Z825 Family history of asthma and other chronic lower respiratory diseases: Secondary | ICD-10-CM

## 2024-09-30 DIAGNOSIS — Z881 Allergy status to other antibiotic agents status: Secondary | ICD-10-CM

## 2024-09-30 DIAGNOSIS — Z720 Tobacco use: Secondary | ICD-10-CM | POA: Diagnosis present

## 2024-09-30 DIAGNOSIS — D72829 Elevated white blood cell count, unspecified: Secondary | ICD-10-CM | POA: Diagnosis present

## 2024-09-30 DIAGNOSIS — Z95820 Peripheral vascular angioplasty status with implants and grafts: Secondary | ICD-10-CM

## 2024-09-30 DIAGNOSIS — I200 Unstable angina: Principal | ICD-10-CM

## 2024-09-30 DIAGNOSIS — R079 Chest pain, unspecified: Secondary | ICD-10-CM | POA: Diagnosis present

## 2024-09-30 LAB — BASIC METABOLIC PANEL WITH GFR
Anion gap: 10 (ref 5–15)
BUN: 15 mg/dL (ref 6–20)
CO2: 24 mmol/L (ref 22–32)
Calcium: 8.4 mg/dL — ABNORMAL LOW (ref 8.9–10.3)
Chloride: 104 mmol/L (ref 98–111)
Creatinine, Ser: 0.9 mg/dL (ref 0.44–1.00)
GFR, Estimated: >60 mL/min
Glucose, Bld: 126 mg/dL — ABNORMAL HIGH (ref 70–99)
Potassium: 4.4 mmol/L (ref 3.5–5.1)
Sodium: 137 mmol/L (ref 135–145)

## 2024-09-30 LAB — CBC
HCT: 44.7 % (ref 36.0–46.0)
Hemoglobin: 14.4 g/dL (ref 12.0–15.0)
MCH: 31 pg (ref 26.0–34.0)
MCHC: 32.2 g/dL (ref 30.0–36.0)
MCV: 96.1 fL (ref 80.0–100.0)
Platelets: 253 K/uL (ref 150–400)
RBC: 4.65 MIL/uL (ref 3.87–5.11)
RDW: 13.1 % (ref 11.5–15.5)
WBC: 12.7 K/uL — ABNORMAL HIGH (ref 4.0–10.5)
nRBC: 0 % (ref 0.0–0.2)

## 2024-09-30 LAB — TROPONIN T, HIGH SENSITIVITY: Troponin T High Sensitivity: <15 ng/L (ref 0–19)

## 2024-09-30 NOTE — ED Triage Notes (Signed)
 Patient arrs via [facility] for centralized chest pain associated with left arm and left shoulder pain. She describes this pain as burning. She reports the pain started after her cat laid a dead mouse on her chest. She took Asprin PTA and reports some relief from pain. 0.4 nitroglycerin , 4mg  Zofran  given PTA.   BP: 200/102 -> 122/78

## 2024-10-01 ENCOUNTER — Observation Stay (HOSPITAL_COMMUNITY)

## 2024-10-01 ENCOUNTER — Encounter (HOSPITAL_COMMUNITY): Admission: EM | Disposition: A | Payer: Self-pay | Source: Home / Self Care | Attending: Internal Medicine

## 2024-10-01 ENCOUNTER — Emergency Department (HOSPITAL_COMMUNITY)

## 2024-10-01 ENCOUNTER — Encounter (HOSPITAL_COMMUNITY): Payer: Self-pay | Admitting: Internal Medicine

## 2024-10-01 DIAGNOSIS — Z880 Allergy status to penicillin: Secondary | ICD-10-CM | POA: Diagnosis not present

## 2024-10-01 DIAGNOSIS — I25110 Atherosclerotic heart disease of native coronary artery with unstable angina pectoris: Secondary | ICD-10-CM

## 2024-10-01 DIAGNOSIS — R7989 Other specified abnormal findings of blood chemistry: Secondary | ICD-10-CM | POA: Diagnosis present

## 2024-10-01 DIAGNOSIS — Z833 Family history of diabetes mellitus: Secondary | ICD-10-CM | POA: Diagnosis not present

## 2024-10-01 DIAGNOSIS — F988 Other specified behavioral and emotional disorders with onset usually occurring in childhood and adolescence: Secondary | ICD-10-CM

## 2024-10-01 DIAGNOSIS — D72829 Elevated white blood cell count, unspecified: Secondary | ICD-10-CM | POA: Diagnosis present

## 2024-10-01 DIAGNOSIS — Z803 Family history of malignant neoplasm of breast: Secondary | ICD-10-CM | POA: Diagnosis not present

## 2024-10-01 DIAGNOSIS — J45909 Unspecified asthma, uncomplicated: Secondary | ICD-10-CM | POA: Diagnosis present

## 2024-10-01 DIAGNOSIS — Z8249 Family history of ischemic heart disease and other diseases of the circulatory system: Secondary | ICD-10-CM | POA: Diagnosis not present

## 2024-10-01 DIAGNOSIS — R079 Chest pain, unspecified: Secondary | ICD-10-CM | POA: Diagnosis not present

## 2024-10-01 DIAGNOSIS — F17210 Nicotine dependence, cigarettes, uncomplicated: Secondary | ICD-10-CM | POA: Diagnosis present

## 2024-10-01 DIAGNOSIS — F411 Generalized anxiety disorder: Secondary | ICD-10-CM | POA: Diagnosis present

## 2024-10-01 DIAGNOSIS — Z95820 Peripheral vascular angioplasty status with implants and grafts: Secondary | ICD-10-CM | POA: Diagnosis not present

## 2024-10-01 DIAGNOSIS — Z881 Allergy status to other antibiotic agents status: Secondary | ICD-10-CM | POA: Diagnosis not present

## 2024-10-01 DIAGNOSIS — Z888 Allergy status to other drugs, medicaments and biological substances status: Secondary | ICD-10-CM | POA: Diagnosis not present

## 2024-10-01 DIAGNOSIS — Z720 Tobacco use: Secondary | ICD-10-CM

## 2024-10-01 DIAGNOSIS — Z7982 Long term (current) use of aspirin: Secondary | ICD-10-CM | POA: Diagnosis not present

## 2024-10-01 DIAGNOSIS — Z886 Allergy status to analgesic agent status: Secondary | ICD-10-CM | POA: Diagnosis not present

## 2024-10-01 DIAGNOSIS — Z716 Tobacco abuse counseling: Secondary | ICD-10-CM | POA: Diagnosis not present

## 2024-10-01 DIAGNOSIS — Z79899 Other long term (current) drug therapy: Secondary | ICD-10-CM | POA: Diagnosis not present

## 2024-10-01 DIAGNOSIS — Z8679 Personal history of other diseases of the circulatory system: Secondary | ICD-10-CM

## 2024-10-01 DIAGNOSIS — I2510 Atherosclerotic heart disease of native coronary artery without angina pectoris: Secondary | ICD-10-CM | POA: Diagnosis present

## 2024-10-01 DIAGNOSIS — I200 Unstable angina: Secondary | ICD-10-CM | POA: Diagnosis present

## 2024-10-01 DIAGNOSIS — I739 Peripheral vascular disease, unspecified: Secondary | ICD-10-CM | POA: Diagnosis present

## 2024-10-01 DIAGNOSIS — I214 Non-ST elevation (NSTEMI) myocardial infarction: Principal | ICD-10-CM

## 2024-10-01 DIAGNOSIS — Z885 Allergy status to narcotic agent status: Secondary | ICD-10-CM | POA: Diagnosis not present

## 2024-10-01 DIAGNOSIS — Z91030 Bee allergy status: Secondary | ICD-10-CM | POA: Diagnosis not present

## 2024-10-01 DIAGNOSIS — Z825 Family history of asthma and other chronic lower respiratory diseases: Secondary | ICD-10-CM | POA: Diagnosis not present

## 2024-10-01 DIAGNOSIS — I10 Essential (primary) hypertension: Secondary | ICD-10-CM | POA: Diagnosis present

## 2024-10-01 DIAGNOSIS — Z7902 Long term (current) use of antithrombotics/antiplatelets: Secondary | ICD-10-CM | POA: Diagnosis not present

## 2024-10-01 HISTORY — PX: LEFT HEART CATH AND CORONARY ANGIOGRAPHY: CATH118249

## 2024-10-01 LAB — CBC WITH DIFFERENTIAL/PLATELET
Abs Immature Granulocytes: 0.04 K/uL (ref 0.00–0.07)
Basophils Absolute: 0.1 K/uL (ref 0.0–0.1)
Basophils Relative: 0 %
Eosinophils Absolute: 0.3 K/uL (ref 0.0–0.5)
Eosinophils Relative: 2 %
HCT: 45.7 % (ref 36.0–46.0)
Hemoglobin: 15 g/dL (ref 12.0–15.0)
Immature Granulocytes: 0 %
Lymphocytes Relative: 34 %
Lymphs Abs: 4.6 K/uL — ABNORMAL HIGH (ref 0.7–4.0)
MCH: 31.2 pg (ref 26.0–34.0)
MCHC: 32.8 g/dL (ref 30.0–36.0)
MCV: 95 fL (ref 80.0–100.0)
Monocytes Absolute: 1 K/uL (ref 0.1–1.0)
Monocytes Relative: 8 %
Neutro Abs: 7.3 K/uL (ref 1.7–7.7)
Neutrophils Relative %: 56 %
Platelets: 259 K/uL (ref 150–400)
RBC: 4.81 MIL/uL (ref 3.87–5.11)
RDW: 13.2 % (ref 11.5–15.5)
WBC: 13.2 K/uL — ABNORMAL HIGH (ref 4.0–10.5)
nRBC: 0 % (ref 0.0–0.2)

## 2024-10-01 LAB — LIPASE, BLOOD: Lipase: 14 U/L (ref 11–51)

## 2024-10-01 LAB — URINE DRUG SCREEN
Amphetamines: NEGATIVE
Barbiturates: NEGATIVE
Benzodiazepines: NEGATIVE
Cocaine: NEGATIVE
Fentanyl: POSITIVE — AB
Methadone Scn, Ur: NEGATIVE
Opiates: POSITIVE — AB
Tetrahydrocannabinol: NEGATIVE

## 2024-10-01 LAB — ECHOCARDIOGRAM COMPLETE
Area-P 1/2: 4.39 cm2
Height: 67 in
S' Lateral: 2.7 cm
Single Plane A4C EF: 64.4 %
Weight: 3680 [oz_av]

## 2024-10-01 LAB — COMPREHENSIVE METABOLIC PANEL WITH GFR
ALT: 13 U/L (ref 0–44)
AST: 21 U/L (ref 15–41)
Albumin: 3.9 g/dL (ref 3.5–5.0)
Alkaline Phosphatase: 107 U/L (ref 38–126)
Anion gap: 11 (ref 5–15)
BUN: 14 mg/dL (ref 6–20)
CO2: 24 mmol/L (ref 22–32)
Calcium: 9.1 mg/dL (ref 8.9–10.3)
Chloride: 101 mmol/L (ref 98–111)
Creatinine, Ser: 0.87 mg/dL (ref 0.44–1.00)
GFR, Estimated: >60 mL/min
Glucose, Bld: 107 mg/dL — ABNORMAL HIGH (ref 70–99)
Potassium: 4.5 mmol/L (ref 3.5–5.1)
Sodium: 137 mmol/L (ref 135–145)
Total Bilirubin: 0.2 mg/dL (ref 0.0–1.2)
Total Protein: 7.1 g/dL (ref 6.5–8.1)

## 2024-10-01 LAB — TROPONIN T, HIGH SENSITIVITY
Troponin T High Sensitivity: 40 ng/L — ABNORMAL HIGH (ref 0–19)
Troponin T High Sensitivity: 178 ng/L (ref 0–19)

## 2024-10-01 LAB — PROTIME-INR
INR: 1 (ref 0.8–1.2)
Prothrombin Time: 13.6 s (ref 11.4–15.2)

## 2024-10-01 LAB — HEPARIN LEVEL (UNFRACTIONATED): Heparin Unfractionated: 0.12 [IU]/mL — ABNORMAL LOW (ref 0.30–0.70)

## 2024-10-01 LAB — APTT: aPTT: 28 s (ref 24–36)

## 2024-10-01 LAB — MAGNESIUM: Magnesium: 2.2 mg/dL (ref 1.7–2.4)

## 2024-10-01 MED ORDER — NICOTINE 14 MG/24HR TD PT24
14.0000 mg | MEDICATED_PATCH | Freq: Every day | TRANSDERMAL | Status: DC | PRN
  Administered 2024-10-01 – 2024-10-02 (×2): 14 mg via TRANSDERMAL
  Filled 2024-10-01 (×2): qty 1

## 2024-10-01 MED ORDER — HYDROMORPHONE HCL 1 MG/ML IJ SOLN
0.5000 mg | INTRAMUSCULAR | Status: DC | PRN
  Administered 2024-10-01: 0.5 mg via INTRAVENOUS
  Filled 2024-10-01: qty 1

## 2024-10-01 MED ORDER — FENTANYL CITRATE (PF) 50 MCG/ML IJ SOSY
25.0000 ug | PREFILLED_SYRINGE | INTRAMUSCULAR | Status: DC | PRN
  Administered 2024-10-01 (×4): 25 ug via INTRAVENOUS
  Filled 2024-10-01 (×4): qty 1

## 2024-10-01 MED ORDER — MIDAZOLAM HCL 2 MG/2ML IJ SOLN
INTRAMUSCULAR | Status: AC
  Filled 2024-10-01: qty 2

## 2024-10-01 MED ORDER — ACETAMINOPHEN 650 MG RE SUPP: 650.0000 mg | Freq: Four times a day (QID) | RECTAL | Status: DC | PRN

## 2024-10-01 MED ORDER — FENTANYL CITRATE (PF) 50 MCG/ML IJ SOSY
100.0000 ug | PREFILLED_SYRINGE | Freq: Once | INTRAMUSCULAR | Status: AC
  Administered 2024-10-01: 100 ug via INTRAVENOUS
  Filled 2024-10-01: qty 2

## 2024-10-01 MED ORDER — ONDANSETRON HCL 4 MG/2ML IJ SOLN: 4.0000 mg | Freq: Four times a day (QID) | INTRAMUSCULAR | Status: DC | PRN

## 2024-10-01 MED ORDER — PERFLUTREN LIPID MICROSPHERE
1.0000 mL | INTRAVENOUS | Status: AC | PRN
  Administered 2024-10-01: 2 mL via INTRAVENOUS

## 2024-10-01 MED ORDER — FENTANYL CITRATE (PF) 100 MCG/2ML IJ SOLN
INTRAMUSCULAR | Status: AC
  Filled 2024-10-01: qty 2

## 2024-10-01 MED ORDER — HEPARIN BOLUS VIA INFUSION
4000.0000 [IU] | Freq: Once | INTRAVENOUS | Status: AC
  Administered 2024-10-01: 4000 [IU] via INTRAVENOUS
  Filled 2024-10-01: qty 4000

## 2024-10-01 MED ORDER — LIDOCAINE HCL (PF) 1 % IJ SOLN
INTRAMUSCULAR | Status: DC | PRN
  Administered 2024-10-01: 2 mL

## 2024-10-01 MED ORDER — SODIUM CHLORIDE 0.9% FLUSH: 3.0000 mL | INTRAVENOUS | Status: DC | PRN

## 2024-10-01 MED ORDER — ONDANSETRON HCL 4 MG/2ML IJ SOLN
4.0000 mg | Freq: Once | INTRAMUSCULAR | Status: AC
  Administered 2024-10-01: 4 mg via INTRAVENOUS
  Filled 2024-10-01: qty 2

## 2024-10-01 MED ORDER — FREE WATER
500.0000 mL | Freq: Once | Status: AC
  Administered 2024-10-01: 500 mL via ORAL

## 2024-10-01 MED ORDER — HEPARIN SODIUM (PORCINE) 1000 UNIT/ML IJ SOLN
INTRAMUSCULAR | Status: DC | PRN
  Administered 2024-10-01: 5000 [IU] via INTRAVENOUS

## 2024-10-01 MED ORDER — HEPARIN (PORCINE) 25000 UT/250ML-% IV SOLN
1200.0000 [IU]/h | INTRAVENOUS | Status: DC
  Administered 2024-10-01: 1000 [IU]/h via INTRAVENOUS
  Filled 2024-10-01: qty 250

## 2024-10-01 MED ORDER — LIDOCAINE HCL (PF) 1 % IJ SOLN
INTRAMUSCULAR | Status: AC
  Filled 2024-10-01: qty 30

## 2024-10-01 MED ORDER — LORAZEPAM 2 MG/ML IJ SOLN: 0.5000 mg | Freq: Four times a day (QID) | INTRAMUSCULAR | Status: DC | PRN

## 2024-10-01 MED ORDER — METHOCARBAMOL 1000 MG/10ML IJ SOLN
500.0000 mg | Freq: Once | INTRAMUSCULAR | Status: AC
  Administered 2024-10-01: 500 mg via INTRAVENOUS
  Filled 2024-10-01: qty 10

## 2024-10-01 MED ORDER — VERAPAMIL HCL 2.5 MG/ML IV SOLN
INTRAVENOUS | Status: DC | PRN
  Administered 2024-10-01: 10 mL via INTRA_ARTERIAL

## 2024-10-01 MED ORDER — CLOPIDOGREL BISULFATE 75 MG PO TABS
75.0000 mg | ORAL_TABLET | Freq: Every day | ORAL | Status: DC
  Administered 2024-10-01 – 2024-10-02 (×2): 75 mg via ORAL
  Filled 2024-10-01 (×2): qty 1

## 2024-10-01 MED ORDER — HEPARIN SODIUM (PORCINE) 1000 UNIT/ML IJ SOLN
INTRAMUSCULAR | Status: AC
  Filled 2024-10-01: qty 10

## 2024-10-01 MED ORDER — LISINOPRIL 20 MG PO TABS
20.0000 mg | ORAL_TABLET | Freq: Every day | ORAL | Status: DC
  Administered 2024-10-01 – 2024-10-02 (×2): 20 mg via ORAL
  Filled 2024-10-01 (×2): qty 1

## 2024-10-01 MED ORDER — ACETAMINOPHEN 325 MG PO TABS: 650.0000 mg | ORAL_TABLET | Freq: Four times a day (QID) | ORAL | Status: DC | PRN

## 2024-10-01 MED ORDER — HYDROCODONE-ACETAMINOPHEN 5-325 MG PO TABS
2.0000 | ORAL_TABLET | Freq: Once | ORAL | Status: AC
  Administered 2024-10-01: 2 via ORAL
  Filled 2024-10-01: qty 2

## 2024-10-01 MED ORDER — IOHEXOL 350 MG/ML SOLN
INTRAVENOUS | Status: DC | PRN
  Administered 2024-10-01: 25 mL

## 2024-10-01 MED ORDER — ASPIRIN 81 MG PO TBEC
81.0000 mg | DELAYED_RELEASE_TABLET | Freq: Every day | ORAL | Status: DC
  Administered 2024-10-01 – 2024-10-02 (×2): 81 mg via ORAL
  Filled 2024-10-01 (×2): qty 1

## 2024-10-01 MED ORDER — MIDAZOLAM HCL (PF) 2 MG/2ML IJ SOLN
INTRAMUSCULAR | Status: DC | PRN
  Administered 2024-10-01: 2 mg via INTRAVENOUS

## 2024-10-01 MED ORDER — FENTANYL CITRATE (PF) 100 MCG/2ML IJ SOLN
INTRAMUSCULAR | Status: DC | PRN
  Administered 2024-10-01: 25 ug via INTRAVENOUS

## 2024-10-01 MED ORDER — SODIUM CHLORIDE 0.9% FLUSH
3.0000 mL | Freq: Two times a day (BID) | INTRAVENOUS | Status: DC
  Administered 2024-10-01: 3 mL via INTRAVENOUS

## 2024-10-01 MED ORDER — OXYCODONE HCL 5 MG PO TABS
10.0000 mg | ORAL_TABLET | ORAL | Status: DC | PRN
  Administered 2024-10-01 – 2024-10-02 (×3): 10 mg via ORAL
  Filled 2024-10-01 (×3): qty 2

## 2024-10-01 MED ORDER — IOHEXOL 350 MG/ML SOLN
100.0000 mL | Freq: Once | INTRAVENOUS | Status: AC | PRN
  Administered 2024-10-01: 100 mL via INTRAVENOUS

## 2024-10-01 MED ORDER — SODIUM CHLORIDE 0.9 % IV SOLN: 250.0000 mL | INTRAVENOUS | Status: DC | PRN

## 2024-10-01 MED ORDER — ASPIRIN 81 MG PO CHEW: 81.0000 mg | CHEWABLE_TABLET | ORAL | Status: DC

## 2024-10-01 MED ORDER — NITROGLYCERIN 2 % TD OINT
1.0000 [in_us] | TOPICAL_OINTMENT | Freq: Once | TRANSDERMAL | Status: AC
  Administered 2024-10-01: 1 [in_us] via TOPICAL
  Filled 2024-10-01: qty 1

## 2024-10-01 MED ORDER — VERAPAMIL HCL 2.5 MG/ML IV SOLN
INTRAVENOUS | Status: AC
  Filled 2024-10-01: qty 2

## 2024-10-01 MED ORDER — HEPARIN (PORCINE) IN NACL 2000-0.9 UNIT/L-% IV SOLN
INTRAVENOUS | Status: DC | PRN
  Administered 2024-10-01: 1000 mL

## 2024-10-01 NOTE — Consult Note (Addendum)
 "  Cardiology Consultation   Patient ID: Frances Graham MRN: [id number]; DOB: 1966-07-11  Admit date: 09/30/2024 Date of Consult: 10/01/2024  PCP:  Loring Tanda Mae, MD   [facility] Providers Cardiologist:  None        Patient Profile: Frances Graham is a 59 y.o. female with a hx of peripheral artery disease s/p aortobifemoral bypass, hypertension who is being seen 10/01/2024 for the evaluation of chest pain at the request of the Emergency Department.  History of Present Illness: Frances Graham states that she was in her usual state of health, whenever her cat brought a mouse to her.  This caused the patient to freak out.  Immediately developed sharp, sudden onset pain throughout the chest.  The pain was nonradiating and reproducible with movements..  She also had neck pain, and shoulder pain that were also reproducible with movements.  This prompted an ER visit.  In the MS, patient states her blood pressures were in the 200s.  Review of systems negative for shortness of breath, palpitations, syncope, dizziness.   Patient has no prior history of chest pain.  She has stairs in her home, and has not endorsed chest pain or dyspnea with exertion.  She has a family history of coronary artery disease, but is unsure when each family member developed coronary artery disease.  No family history of sudden cardiac death or cardiac implantable electronic devices.  Patient has a history of an aorto bifemoral bypass graft that was placed in 2017.  Cardiac workup in 06/2016. TTE showed an EF 60%, no RWMA, no evidence of valvular regurgitation. Pharmacological NM SPEC showed no reversible ischemia, though technically limited.  Patient also has hypertension.  Her med list includes amlodipine , senna Pearl-hydrochlorothiazide , however she states she only takes these medications as needed for her blood pressure.  Patient smokes 5 cigarettes a day.  Notable labs include BP 140s-170s/80s, HR 80s, on  RA, hsTnT <15->40, K 4.4, sCr 0.9, WBC 13.2, INR 1.0. CTA normal  Past Medical History:  Diagnosis Date   Anxiety    panic attacks- has then post op   Asthma    as a child   Attention deficit disorder    Essential hypertension    off  medications, has lost 69 lbs   Headache    History of kidney stones    passed   PAD (peripheral artery disease)    Infrarenal aortic occlusion   Temporomandibular joint disorder     Past Surgical History:  Procedure Laterality Date   AMPUTATION Right 08/01/2016   Procedure: AMPUTATION GREAT TOE;  Surgeon: Lonni GORMAN Blade, MD;  Location: [facility] OR;  Service: Vascular;  Laterality: Right;   AMPUTATION TOE Right 08/01/2016   AORTA - BILATERAL FEMORAL ARTERY BYPASS GRAFT Bilateral 06/28/2016   Procedure: AORTOBIFEMORAL BYPASS GRAFT;  Surgeon: Lonni GORMAN Blade, MD;  Location: [facility] OR;  Service: Vascular;  Laterality: Bilateral;   BIOPSY THYROID      INCISIONAL HERNIA REPAIR N/A 01/20/2020   Procedure: LAPAROSCOPIC INCISIONAL HERNIA REPAIR WITH MESH;  Surgeon: Vernetta Berg, MD;  Location: [facility] OR;  Service: General;  Laterality: N/A;   LEG SURGERY Left 1970   pin  to fix cogental hip   MENISCUS REPAIR Left    2015   TOOTH EXTRACTION Bilateral 10/23/2018   Procedure: DENTAL RESTORATION/EXTRACTIONS;  Surgeon: Sheryle Hamilton, DDS;  Location: [facility] OR;  Service: Oral Surgery;  Laterality: Bilateral;     Home Medications:  Prior to Admission medications  Medication  Sig Start Date End Date Taking? Authorizing Provider  amphetamine -dextroamphetamine  (ADDERALL) 20 MG tablet Take 20 mg by mouth daily as needed (attention/focus/ADD).    [provider]  SUMAtriptan (IMITREX) 100 MG tablet TAKE 1 TABLET BY MOUTH AS NEEDED FOR MIGRAINE AND REPEAT IN 2 HOURS. ONLY UP TO A MAX OF 2 PER DAY 12/31/19   [provider]  traZODone (DESYREL) 150 MG tablet Take 150 mg by mouth at bedtime as needed for sleep.    [provider]    Scheduled  Meds:  Continuous Infusions:  heparin  1,000 Units/hr (10/01/24 0410)   PRN Meds: acetaminophen  **OR** acetaminophen , fentaNYL  (SUBLIMAZE ) injection, LORazepam , nicotine , ondansetron  (ZOFRAN ) IV  Allergies:   Allergies[1]  Social History:   Social History   Socioeconomic History   Marital status: Widowed    Spouse name: Not on file   Number of children: Not on file   Years of education: Not on file   Highest education level: Not on file  Occupational History   Not on file  Tobacco Use   Smoking status: Every Day    Current packs/day: 0.50    Average packs/day: 0.5 packs/day for 10.0 years (5.0 ttl pk-yrs)    Types: Cigarettes   Smokeless tobacco: Never  Vaping Use   Vaping status: Never Used  Substance and Sexual Activity   Alcohol use: No   Drug use: No   Sexual activity: Not on file  Other Topics Concern   Not on file  Social History Narrative   Not on file   Social Drivers of Health   Tobacco Use: High Risk (10/01/2024)   Patient History    Smoking Tobacco Use: Every Day    Smokeless Tobacco Use: Never    Passive Exposure: Not on file  Financial Resource Strain: Not on file  Food Insecurity: Not on file  Transportation Needs: Not on file  Physical Activity: Not on file  Stress: Not on file  Social Connections: Not on file  Intimate Partner Violence: Not on file  Depression (EYV7-0): Not on file  Alcohol Screen: Not on file  Housing: Not on file  Utilities: Not on file  Health Literacy: Not on file    Family History:    Family History  Problem Relation Age of Onset   COPD Mother    CAD Mother    Diabetes Maternal Uncle    CAD Maternal Grandmother    Breast cancer Paternal Grandmother        30s     ROS:  Please see the history of present illness.   All other ROS reviewed and negative.     Physical Exam/Data: Vitals:   10/01/24 0230 10/01/24 0430 10/01/24 0445 10/01/24 0450  BP: (!) 150/97 (!) 142/76 (!) 157/93   Pulse: 87 84 86   Resp: 20  10 15    Temp:    97.7 F (36.5 C)  TempSrc:    Oral  SpO2: 95% 98% 98%   Weight:      Height:       No intake or output data in the 24 hours ending 10/01/24 0503    09/30/2024   10:52 PM 11/03/2023    6:57 PM 05/26/2023    9:35 AM  Last 3 Weights  Weight (lbs) 230 lb 229 lb 9.6 oz 238 lb 1.6 oz  Weight (kg) 104.327 kg 104.146 kg 108 kg     Body mass index is 36.02 kg/m.  General:  Well nourished, well developed, in no  acute distress HEENT: normal Neck: no JVD Vascular: No carotid bruits; Distal pulses 2+ bilaterally Cardiac:  normal S1, S2; RRR; no murmur  Lungs:  clear to auscultation bilaterally, no wheezing, rhonchi or rales  Abd: soft, nontender, no hepatomegaly  Ext: no edema Musculoskeletal:  No deformities, BUE and BLE strength normal and equal Skin: warm and dry  Neuro:  CNs 2-12 intact, no focal abnormalities noted Psych:  Normal affect   EKG:  The EKG was personally reviewed and demonstrates:  SR  Telemetry:  Telemetry was personally reviewed and demonstrates:  SR  Relevant CV Studies: reviewed  Laboratory Data: High Sensitivity Troponin:  No results for input(s): TROPONINIHS in the last 720 hours.  Recent Labs  Lab 09/30/24 2256 10/01/24 0119  TRNPT <15 40*      Chemistry Recent Labs  Lab 09/30/24 2256  NA 137  K 4.4  CL 104  CO2 24  GLUCOSE 126*  BUN 15  CREATININE 0.90  CALCIUM  8.4*  GFRNONAA >60  ANIONGAP 10    No results for input(s): PROT, ALBUMIN , AST, ALT, ALKPHOS, BILITOT in the last 168 hours. Lipids No results for input(s): CHOL, TRIG, HDL, LABVLDL, LDLCALC, CHOLHDL in the last 168 hours.  Hematology Recent Labs  Lab 09/30/24 2256 10/01/24 0422  WBC 12.7* 13.2*  RBC 4.65 4.81  HGB 14.4 15.0  HCT 44.7 45.7  MCV 96.1 95.0  MCH 31.0 31.2  MCHC 32.2 32.8  RDW 13.1 13.2  PLT 253 259   Thyroid  No results for input(s): TSH, FREET4 in the last 168 hours.  BNPNo results for input(s): BNP, PROBNP  in the last 168 hours.  DDimer No results for input(s): DDIMER in the last 168 hours.  Radiology/Studies:  CT Angio Chest/Abd/Pel for Dissection W and/or Wo Contrast Result Date: 10/01/2024 EXAM: CTA CHEST, ABDOMEN AND PELVIS WITHOUT AND WITH CONTRAST 10/01/2024 12:51:49 AM TECHNIQUE: CTA of the chest was performed without and with the administration of intravenous contrast. CTA of the abdomen and pelvis was performed without and with the administration of intravenous contrast. 100 mL of iohexol  (OMNIPAQUE ) 350 MG/ML injection was administered. Multiplanar reformatted images are provided for review. MIP images are provided for review. Automated exposure control, iterative reconstruction, and/or weight based adjustment of the mA/kV was utilized to reduce the radiation dose to as low as reasonably achievable. COMPARISON: 11/10/2023 CLINICAL HISTORY: Acute aortic syndrome (AAS) suspected. FINDINGS: VASCULATURE: AORTA: Scattered aortic atherosclerosis. Distal aortobifemoral bypass. No acute finding. No abdominal aortic aneurysm. No dissection. PULMONARY ARTERIES: No pulmonary embolism with the limits of this exam. GREAT VESSELS OF AORTIC ARCH: No acute finding. No dissection. No arterial occlusion or significant stenosis. CELIAC TRUNK: No acute finding. No occlusion or significant stenosis. SUPERIOR MESENTERIC ARTERY: No acute finding. No occlusion or significant stenosis. INFERIOR MESENTERIC ARTERY: No acute finding. No occlusion or significant stenosis. RENAL ARTERIES: No acute finding. No occlusion or significant stenosis. ILIAC ARTERIES: No acute finding. No occlusion or significant stenosis. CHEST: MEDIASTINUM: Scattered coronary artery atherosclerosis. No mediastinal lymphadenopathy. The heart and pericardium demonstrate no acute abnormality. LUNGS AND PLEURA: The lungs are without acute process. No focal consolidation or pulmonary edema. No evidence of pleural effusion or pneumothorax. THORACIC BONES AND  SOFT TISSUES: Right thyroid  nodule measures up to 2.6 cm. This has been previously evaluated by ultrasound and biopsy. No acute bone or soft tissue abnormality. ABDOMEN AND PELVIS: LIVER: The liver is unremarkable. GALLBLADDER AND BILE DUCTS: The gallbladder is contracted. 1.5 cm gallstone noted. No biliary ductal dilatation.  SPLEEN: The spleen is unremarkable. PANCREAS: The pancreas is unremarkable. ADRENAL GLANDS: Bilateral adrenal glands demonstrate no acute abnormality. KIDNEYS, URETERS AND BLADDER: No stones in the kidneys or ureters. No hydronephrosis. No perinephric or periureteral stranding. Urinary bladder is unremarkable. GI AND BOWEL: Stomach and duodenal sweep demonstrate no acute abnormality. Normal appendix. There is no bowel obstruction. No abnormal bowel wall thickening or distension. REPRODUCTIVE: Reproductive organs are unremarkable. PERITONEUM AND RETROPERITONEUM: No ascites or free air. LYMPH NODES: No lymphadenopathy. ABDOMINAL BONES AND SOFT TISSUES: No acute abnormality of the bones. No acute soft tissue abnormality. IMPRESSION: 1. No acute aortic syndrome. 2. Aortobifemoral bypass. 3. Scattered coronary artery and aortic atherosclerosis. 4. No acute findings in the chest, abdomen, or pelvis. Electronically signed by: Franky Crease MD MD 10/01/2024 12:58 AM EST RP Workstation: [id number]   DG Chest 2 View Result Date: 09/30/2024 CLINICAL DATA:  Chest pain EXAM: CHEST - 2 VIEW COMPARISON:  08/07/2016 FINDINGS: The heart size and mediastinal contours are within normal limits. Both lungs are clear. The visualized skeletal structures are unremarkable. IMPRESSION: No active cardiopulmonary disease. Electronically Signed   By: Luke Bun M.D.   On: 09/30/2024 23:22     Assessment and Plan:  Frances Graham is a 59 y.o. female with a hx of peripheral artery disease s/p aortobifemoral bypass, hypertension who is being seen 10/01/2024 for the evaluation of chest pain at the request of the  Emergency Department.  Patient currently chest pain-free.  Patient's chest pain appears noncardiac in nature, exacerbated by her traumatic event that happened earlier today.  ECG shows no evidence of infarct or ischemia.  She has mild acute myocardial injury, this could likely be in the setting of her elevated high blood pressure.  Her risk factors for coronary artery disease include peripheral vascular disease and tobacco use.   Recommendations -stop heparin  -TTE -f/u with third troponin -can obtain coronary CT if chest pain reoccur or troponin significantly elevated.    Risk Assessment/Risk Scores:    TIMI Risk Score for Unstable Angina or Non-ST Elevation MI:   The patient's TIMI risk score is  , which indicates a  % risk of all cause mortality, new or recurrent myocardial infarction or need for urgent revascularization in the next 14 days.      For questions or updates, please contact [facility] Please consult [email or web address] for contact info under   Signed, DaMarcus A Ingram, MD  10/01/2024 5:03 AM   Frances Graham was seen by me today along with DaMarcus Ingram, MD. I have personally performed an evaluation on this patient.  My findings are as follows: 59 y.o. female with history of PAD s/p aortobifem bypass, HTN and anxiety admitted with chest pain. She has had no chest pain since being admitted. Troponin now over 170.   Data: EKG(s) and pertinent labs, studies, etc were personally reviewed and interpreted by me:  EKG with sinus Tele: sinus Otherwise, I agree with data as outlined by the advanced practice provider.  Exam performed by me: Gen: NAD Neck: No JVD Cardiac: RRR Lungs: clear bilat Extremities: no LE edema  My Assessment and Plan:  NSTEMI: Her clinical presentation is worrisome for ACS. Troponin is elevated c/w ACS. Risk factors for CAD include HTN, obesity, tobacco abuse. She is known to have severe PAD. Cardiac cath is indicated.  Continue  heparin , ASA Cardiac cath today  I have reviewed the risks, indications, and alternatives to cardiac catheterization, possible angioplasty, and stenting with the patient.  Risks include but are not limited to bleeding, infection, vascular injury, stroke, myocardial infection, arrhythmia, kidney injury, radiation-related injury in the case of prolonged fluoroscopy use, emergency cardiac surgery, and death. The patient understands the risks of serious complication is 1-2 in 1000 with diagnostic cardiac cath and 1-2% or less with angioplasty/stenting.   Signed,  Lonni Cash, MD  10/01/2024 10:54 AM       [1]  Allergies Allergen Reactions   Bee Venom Anaphylaxis   Penicillin G Anaphylaxis   Penicillins Anaphylaxis    Has patient had a PCN reaction causing immediate rash, facial/tongue/throat swelling, SOB or lightheadedness with hypotension: Yes Has patient had a PCN reaction causing severe rash involving mucus membranes or skin necrosis: No Has patient had a PCN reaction that required hospitalization Yes Has patient had a PCN reaction occurring within the last 10 years: No If all of the above answers are NO, then may proceed with Cephalosporin use.    Yellow Jacket Venom Anaphylaxis   Benadryl  [Diphenhydramine  Hcl] Other (See Comments)    syncope   Celecoxib Nausea And Vomiting and Rash   Cephalexin  Nausea And Vomiting, Other (See Comments), Rash and Hives    Projectile vomiting, thrush, fever blisters, ED visit   Lidocaine  Swelling and Rash    Foot swelled up like a balloon   Codeine Other (See Comments)    Reflux. Pt states not allergic to hydrocodone  and oxycodone  .   "

## 2024-10-01 NOTE — Progress Notes (Signed)
 PHARMACY - ANTICOAGULATION CONSULT NOTE  Pharmacy Consult for Heparin   Indication: chest pain/ACS  Allergies[1]  Patient Measurements: Height: 5' 7 (170.2 cm) Weight: 104.3 kg (230 lb) IBW/kg (Calculated) : 61.6 HEPARIN  DW (KG): 85.2  Vital Signs: Temp: 97.3 F (36.3 C) (01/09 1256) Temp Source: Oral (01/09 1256) BP: 160/98 (01/09 1200) Pulse Rate: 87 (01/09 1200)  Labs: Recent Labs    09/30/24 2256 10/01/24 0422 10/01/24 1200  HGB 14.4 15.0  --   HCT 44.7 45.7  --   PLT 253 259  --   APTT  --  28  --   LABPROT  --  13.6  --   INR  --  1.0  --   HEPARINUNFRC  --   --  0.12*  CREATININE 0.90 0.87  --     Estimated Creatinine Clearance: 89.7 mL/min (by C-G formula based on SCr of 0.87 mg/dL).   Medical History: Past Medical History:  Diagnosis Date   Anxiety    panic attacks- has then post op   Asthma    as a child   Attention deficit disorder    Essential hypertension    off  medications, has lost 69 lbs   Headache    History of kidney stones    passed   PAD (peripheral artery disease)    Infrarenal aortic occlusion   Temporomandibular joint disorder     Assessment: 59 y/o F chest pain and mildly elevated troponin. Starting heparin . Labs above reviewed. PTA meds reviewed.   Goal of Therapy:  Heparin  level 0.3-0.7 units/ml Monitor platelets by anticoagulation protocol: Yes   Plan:  Increase IV heparin  to 1200 units/hr F/u plans for heparin  after cath lab.  Harlene Barlow, [employer], BCCP Clinical Pharmacist  10/01/2024 3:18 PM   [facility] pharmacy phone numbers are listed on [email or web address]         [1]  Allergies Allergen Reactions   Bee Venom Anaphylaxis   Penicillins Anaphylaxis    Has patient had a PCN reaction causing immediate rash, facial/tongue/throat swelling, SOB or lightheadedness with hypotension: Yes Has patient had a PCN reaction causing severe rash involving mucus membranes or skin necrosis: No Has patient had a PCN reaction  that required hospitalization Yes Has patient had a PCN reaction occurring within the last 10 years: No If all of the above answers are NO, then may proceed with Cephalosporin use.    Yellow Jacket Venom Anaphylaxis   Celecoxib Nausea And Vomiting and Rash   Cephalexin  Nausea And Vomiting, Other (See Comments), Rash and Hives    Projectile vomiting, thrush, fever blisters, ED visit   Lidocaine  Swelling and Rash    Foot swelled up like a balloon   Codeine Other (See Comments)    Reflux. Pt states not allergic to hydrocodone  and oxycodone  .

## 2024-10-01 NOTE — H&P (View-Only) (Signed)
"  °  Progress Note  Patient Name: Frances Graham Date of Encounter: 10/01/2024 [facility] Cardiologist: None   Interval Summary    No further chest pain this morning. C/o headache with NTG paste   Vital Signs Vitals:   10/01/24 0450 10/01/24 0500 10/01/24 0730 10/01/24 0800  BP:  (!) 144/88 (!) 155/92   Pulse:  86 86 98  Resp:  12 16 15   Temp: 97.7 F (36.5 C)     TempSrc: Oral     SpO2:  95% 95% 100%  Weight:      Height:       No intake or output data in the 24 hours ending 10/01/24 0818    09/30/2024   10:52 PM 11/03/2023    6:57 PM 05/26/2023    9:35 AM  Last 3 Weights  Weight (lbs) 230 lb 229 lb 9.6 oz 238 lb 1.6 oz  Weight (kg) 104.327 kg 104.146 kg 108 kg      Telemetry/ECG   Sinus Rhythm, NSVT - Personally Reviewed  Physical Exam  GEN: No acute distress.   Neck: No JVD Cardiac: RRR, no murmurs, rubs, or gallops.  Respiratory: Clear to auscultation bilaterally. GI: Soft, nontender, non-distended  MS: No edema  Assessment & Plan   59 y.o. female with a hx of peripheral artery disease s/p aortobifemoral bypass, hypertension who was seen 10/01/2024 for the evaluation of chest pain at the request of the Emergency Department.  Chest pain Elevated troponin -- Low risk myoview  in 2017 -- Presented with episode of chest pain after her cat brought a mouse into the house. Started with pain in her neck (like she pulled a muscle) the had heaviness in her chest with radiation down into the left arm and into her back.  -- hsTn neg>>40, repeat pending. EKG without significant ST/T wave changes  -- CT angio negative for PE, scattered coronary artery and aortic atherosclerosis  -- currently on IV heparin , no further chest pain but has a headache with NTG paste -- follow up repeat troponin and echo, further recommendations based on findings -- of note, could be possible vasospasm, does take adderall   PAD s/p aortobifem bypass '17 -- follows with [facility] outpatient    HTN -- elevated in the ED -- will resume home lisinopril , hold hydrochlorothiazide  for now   For questions or updates, please contact [facility] Please consult [email or web address] for contact info under         Signed, Manuelita Rummer, NP   See full H and P  in chart.  I agree with plan above.  Troponin now up over 170.  Pt will need a cardiac cath today.    I have reviewed the risks, indications, and alternatives to cardiac catheterization, possible angioplasty, and stenting with the patient. Risks include but are not limited to bleeding, infection, vascular injury, stroke, myocardial infection, arrhythmia, kidney injury, radiation-related injury in the case of prolonged fluoroscopy use, emergency cardiac surgery, and death. The patient understands the risks of serious complication is 1-2 in 1000 with diagnostic cardiac cath and 1-2% or less with angioplasty/stenting.  Frances Cash, MD, [facility] 10/01/2024 10:12 AM   "

## 2024-10-01 NOTE — Progress Notes (Addendum)
"  °  Progress Note  Patient Name: Frances Graham Date of Encounter: 10/01/2024 [facility] Cardiologist: None   Interval Summary    No further chest pain this morning. C/o headache with NTG paste   Vital Signs Vitals:   10/01/24 0450 10/01/24 0500 10/01/24 0730 10/01/24 0800  BP:  (!) 144/88 (!) 155/92   Pulse:  86 86 98  Resp:  12 16 15   Temp: 97.7 F (36.5 C)     TempSrc: Oral     SpO2:  95% 95% 100%  Weight:      Height:       No intake or output data in the 24 hours ending 10/01/24 0818    09/30/2024   10:52 PM 11/03/2023    6:57 PM 05/26/2023    9:35 AM  Last 3 Weights  Weight (lbs) 230 lb 229 lb 9.6 oz 238 lb 1.6 oz  Weight (kg) 104.327 kg 104.146 kg 108 kg      Telemetry/ECG   Sinus Rhythm, NSVT - Personally Reviewed  Physical Exam  GEN: No acute distress.   Neck: No JVD Cardiac: RRR, no murmurs, rubs, or gallops.  Respiratory: Clear to auscultation bilaterally. GI: Soft, nontender, non-distended  MS: No edema  Assessment & Plan   59 y.o. female with a hx of peripheral artery disease s/p aortobifemoral bypass, hypertension who was seen 10/01/2024 for the evaluation of chest pain at the request of the Emergency Department.  Chest pain Elevated troponin -- Low risk myoview  in 2017 -- Presented with episode of chest pain after her cat brought a mouse into the house. Started with pain in her neck (like she pulled a muscle) the had heaviness in her chest with radiation down into the left arm and into her back.  -- hsTn neg>>40, repeat pending. EKG without significant ST/T wave changes  -- CT angio negative for PE, scattered coronary artery and aortic atherosclerosis  -- currently on IV heparin , no further chest pain but has a headache with NTG paste -- follow up repeat troponin and echo, further recommendations based on findings -- of note, could be possible vasospasm, does take adderall   PAD s/p aortobifem bypass '17 -- follows with [facility] outpatient    HTN -- elevated in the ED -- will resume home lisinopril , hold hydrochlorothiazide  for now   For questions or updates, please contact [facility] Please consult [email or web address] for contact info under         Signed, Manuelita Rummer, NP   See full H and P  in chart.  I agree with plan above.  Troponin now up over 170.  Pt will need a cardiac cath today.    I have reviewed the risks, indications, and alternatives to cardiac catheterization, possible angioplasty, and stenting with the patient. Risks include but are not limited to bleeding, infection, vascular injury, stroke, myocardial infection, arrhythmia, kidney injury, radiation-related injury in the case of prolonged fluoroscopy use, emergency cardiac surgery, and death. The patient understands the risks of serious complication is 1-2 in 1000 with diagnostic cardiac cath and 1-2% or less with angioplasty/stenting.  Lonni Cash, MD, [facility] 10/01/2024 10:12 AM   "

## 2024-10-01 NOTE — Interval H&P Note (Signed)
 History and Physical Interval Note:  10/01/2024 4:49 PM  Frances Graham  has presented today for surgery, with the diagnosis of nstemi.  The various methods of treatment have been discussed with the patient and family. After consideration of risks, benefits and other options for treatment, the patient has consented to  Procedures: LEFT HEART CATH AND CORONARY ANGIOGRAPHY (N/A) possible coronary angioplasty for unstable angina pectoris and NSTEMI as a surgical intervention.  The patient's history has been reviewed, patient examined, no change in status, stable for surgery.  I have reviewed the patient's chart and labs.  Questions were answered to the patient's satisfaction.     Gordy Bergamo

## 2024-10-01 NOTE — ED Provider Notes (Signed)
 " [facility] Provider Note   CSN: [id number] Arrival date & time: 09/30/24  2246     Patient presents with: Chest Pain   Frances Graham is a 59 y.o. female.   The history is provided by the patient and a relative.  Patient history of anxiety, asthma, hypertension, previous aortobifem bypass graft presents with chest pain Patient reports she was in her usual state of health getting into bed.  She reports that a cat dropped off a mouse in her bed She reports she smacked the animals away, and soon after started having pain throughout her neck shoulder chest.  She reports tingling in both of her hands.  No arm or leg weakness No fevers or vomiting but she reports nausea.  She denies shortness of breath She reports the pain is continuing that her chest and left shoulder Denies any abdominal pain. She is a current smoker.  Denies previous history of CAD/VTE.   Past Medical History:  Diagnosis Date   Anxiety    panic attacks- has then post op   Asthma    as a child   Attention deficit disorder    Essential hypertension    off  medications, has lost 69 lbs   Headache    History of kidney stones    passed   PAD (peripheral artery disease)    Infrarenal aortic occlusion   Temporomandibular joint disorder     Prior to Admission medications  Medication Sig Start Date End Date Taking? Authorizing Provider  amphetamine -dextroamphetamine  (ADDERALL) 20 MG tablet Take 20 mg by mouth daily as needed (attention/focus/ADD).    [provider]  SUMAtriptan (IMITREX) 100 MG tablet TAKE 1 TABLET BY MOUTH AS NEEDED FOR MIGRAINE AND REPEAT IN 2 HOURS. ONLY UP TO A MAX OF 2 PER DAY 12/31/19   [provider]  traZODone (DESYREL) 150 MG tablet Take 150 mg by mouth at bedtime as needed for sleep.    [provider]    Allergies: Bee venom, Penicillin g, Penicillins, Yellow jacket venom, Benadryl  [diphenhydramine  hcl], Celecoxib,  Cephalexin , Lidocaine , and Codeine    Review of Systems  Constitutional:  Negative for fever.  Respiratory:  Negative for shortness of breath.   Cardiovascular:  Positive for chest pain.  Gastrointestinal:  Negative for abdominal pain.  Neurological:  Positive for numbness. Negative for weakness.    Updated Vital Signs BP (!) 150/97   Pulse 87   Temp 98.6 F (37 C)   Resp 20   Ht 1.702 m (5' 7)   Wt 104.3 kg   SpO2 95%   BMI 36.02 kg/m   Physical Exam CONSTITUTIONAL: Well developed/well nourished, anxious HEAD: Normocephalic/atraumatic ENMT: Mucous membranes moist NECK: supple no meningeal signs CV: S1/S2 noted, no murmurs/rubs/gallops noted LUNGS: Lungs are clear to auscultation bilaterally, no apparent distress ABDOMEN: soft, nontender NEURO: Pt is awake/alert/appropriate, moves all extremitiesx4.  No facial droop.  No arm or leg drift EXTREMITIES: pulses normal/equalx4, full ROM SKIN: warm, color normal PSYCH: Anxious (all labs ordered are listed, but only abnormal results are displayed) Labs Reviewed  BASIC METABOLIC PANEL WITH GFR - Abnormal; Notable for the following components:      Result Value   Glucose, Bld 126 (*)    Calcium  8.4 (*)    All other components within normal limits  CBC - Abnormal; Notable for the following components:   WBC 12.7 (*)    All other components within normal limits  TROPONIN T, HIGH SENSITIVITY - Abnormal; Notable for the following components:   Troponin T High Sensitivity 40 (*)    All other components within normal limits  APTT  PROTIME-INR  CBC WITH DIFFERENTIAL/PLATELET  COMPREHENSIVE METABOLIC PANEL WITH GFR  MAGNESIUM   TROPONIN T, HIGH SENSITIVITY    EKG: EKG Interpretation Date/Time:  Thursday September 30 2024 22:51:25 EST Ventricular Rate:  105 PR Interval:  150 QRS Duration:  72 QT Interval:  326 QTC Calculation: 430 R Axis:   79  Text Interpretation: Sinus tachycardia Low voltage QRS Borderline ECG No  significant change since last tracing Confirmed by Emil Share [phone]) on 09/30/2024 11:04:43 PM  Radiology: CT Angio Chest/Abd/Pel for Dissection W and/or Wo Contrast Result Date: 10/01/2024 EXAM: CTA CHEST, ABDOMEN AND PELVIS WITHOUT AND WITH CONTRAST 10/01/2024 12:51:49 AM TECHNIQUE: CTA of the chest was performed without and with the administration of intravenous contrast. CTA of the abdomen and pelvis was performed without and with the administration of intravenous contrast. 100 mL of iohexol  (OMNIPAQUE ) 350 MG/ML injection was administered. Multiplanar reformatted images are provided for review. MIP images are provided for review. Automated exposure control, iterative reconstruction, and/or weight based adjustment of the mA/kV was utilized to reduce the radiation dose to as low as reasonably achievable. COMPARISON: 11/10/2023 CLINICAL HISTORY: Acute aortic syndrome (AAS) suspected. FINDINGS: VASCULATURE: AORTA: Scattered aortic atherosclerosis. Distal aortobifemoral bypass. No acute finding. No abdominal aortic aneurysm. No dissection. PULMONARY ARTERIES: No pulmonary embolism with the limits of this exam. GREAT VESSELS OF AORTIC ARCH: No acute finding. No dissection. No arterial occlusion or significant stenosis. CELIAC TRUNK: No acute finding. No occlusion or significant stenosis. SUPERIOR MESENTERIC ARTERY: No acute finding. No occlusion or significant stenosis. INFERIOR MESENTERIC ARTERY: No acute finding. No occlusion or significant stenosis. RENAL ARTERIES: No acute finding. No occlusion or significant stenosis. ILIAC ARTERIES: No acute finding. No occlusion or significant stenosis. CHEST: MEDIASTINUM: Scattered coronary artery atherosclerosis. No mediastinal lymphadenopathy. The heart and pericardium demonstrate no acute abnormality. LUNGS AND PLEURA: The lungs are without acute process. No focal consolidation or pulmonary edema. No evidence of pleural effusion or pneumothorax. THORACIC BONES AND SOFT  TISSUES: Right thyroid  nodule measures up to 2.6 cm. This has been previously evaluated by ultrasound and biopsy. No acute bone or soft tissue abnormality. ABDOMEN AND PELVIS: LIVER: The liver is unremarkable. GALLBLADDER AND BILE DUCTS: The gallbladder is contracted. 1.5 cm gallstone noted. No biliary ductal dilatation. SPLEEN: The spleen is unremarkable. PANCREAS: The pancreas is unremarkable. ADRENAL GLANDS: Bilateral adrenal glands demonstrate no acute abnormality. KIDNEYS, URETERS AND BLADDER: No stones in the kidneys or ureters. No hydronephrosis. No perinephric or periureteral stranding. Urinary bladder is unremarkable. GI AND BOWEL: Stomach and duodenal sweep demonstrate no acute abnormality. Normal appendix. There is no bowel obstruction. No abnormal bowel wall thickening or distension. REPRODUCTIVE: Reproductive organs are unremarkable. PERITONEUM AND RETROPERITONEUM: No ascites or free air. LYMPH NODES: No lymphadenopathy. ABDOMINAL BONES AND SOFT TISSUES: No acute abnormality of the bones. No acute soft tissue abnormality. IMPRESSION: 1. No acute aortic syndrome. 2. Aortobifemoral bypass. 3. Scattered coronary artery and aortic atherosclerosis. 4. No acute findings in the chest, abdomen, or pelvis. Electronically signed by: Franky Crease MD MD 10/01/2024 12:58 AM EST RP Workstation: [id number]   DG Chest 2 View Result Date: 09/30/2024 CLINICAL DATA:  Chest pain EXAM: CHEST - 2 VIEW COMPARISON:  08/07/2016 FINDINGS: The heart size and mediastinal contours are within normal limits. Both lungs are clear. The visualized skeletal structures are unremarkable.  IMPRESSION: No active cardiopulmonary disease. Electronically Signed   By: Luke Bun M.D.   On: 09/30/2024 23:22     .Critical Care  Performed by: Midge Golas, MD Authorized by: Midge Golas, MD   Critical care provider statement:    Critical care time (minutes):  45   Critical care start time:  10/01/2024 2:30 AM   Critical care end  time:  10/01/2024 3:15 AM   Critical care time was exclusive of:  Separately billable procedures and treating other patients   Critical care was necessary to treat or prevent imminent or life-threatening deterioration of the following conditions:  Cardiac failure   Critical care was time spent personally by me on the following activities:  Obtaining history from patient or surrogate, examination of patient, evaluation of patient's response to treatment, discussions with consultants, development of treatment plan with patient or surrogate, ordering and review of laboratory studies, ordering and performing treatments and interventions, ordering and review of radiographic studies, pulse oximetry, re-evaluation of patient's condition and review of old charts   I assumed direction of critical care for this patient from another provider in my specialty: no     Care discussed with: admitting provider      Medications Ordered in the ED  heparin  ADULT infusion 100 units/mL (25000 units/257mL) (1,000 Units/hr Intravenous New Bag/Given 10/01/24 0410)  acetaminophen  (TYLENOL ) tablet 650 mg (has no administration in time range)    Or  acetaminophen  (TYLENOL ) suppository 650 mg (has no administration in time range)  ondansetron  (ZOFRAN ) injection 4 mg (has no administration in time range)  fentaNYL  (SUBLIMAZE ) injection 25 mcg (has no administration in time range)  fentaNYL  (SUBLIMAZE ) injection 100 mcg (has no administration in time range)  fentaNYL  (SUBLIMAZE ) injection 100 mcg (100 mcg Intravenous Given 10/01/24 0015)  ondansetron  (ZOFRAN ) injection 4 mg (4 mg Intravenous Given 10/01/24 0011)  iohexol  (OMNIPAQUE ) 350 MG/ML injection 100 mL (100 mLs Intravenous Contrast Given 10/01/24 0048)  HYDROcodone -acetaminophen  (NORCO/VICODIN) 5-325 MG per tablet 2 tablet (2 tablets Oral Given 10/01/24 0135)  nitroGLYCERIN  (NITROGLYN) 2 % ointment 1 inch (1 inch Topical Given 10/01/24 0357)  heparin  bolus via infusion 4,000 Units  (4,000 Units Intravenous Bolus from Bag 10/01/24 0410)    Clinical Course as of 10/01/24 0437  Fri Oct 01, 2024  0025 Patient presents with chest pain after swipe with a cat and mouse in her bed She also reports shoulder pain and upper back pain.  She has some mild tenderness to palpation However patient does have extensive history including untreated hypertension, tobacco use, and previous history of aortobifem bypass. Will expand the workup to include CT dissection study given the chest pain/back pain and upper extremity numbness [DW]  0254 WBC(!): 12.7 Leukocytosis [DW]  0255 Patient now has elevated troponin.  She reports increasing chest pressure and feeling diaphoretic Will admit, consult cardiology, will start nitroglycerin  and heparin  [DW]  0305 Discussed with on-call cardiology fellow Dr. Gail He agrees with heparin  and nitro, admit to medicine and likely consult and cath later today [DW]  0400 D/w dr marcene for admission [DW]    Clinical Course User Index [DW] Midge Golas, MD             HEART Score: 3                    Medical Decision Making Amount and/or Complexity of Data Reviewed Labs: ordered. Decision-making details documented in ED Course. Radiology: ordered. ECG/medicine tests: ordered.  Risk Prescription drug  management. Decision regarding hospitalization.   This patient presents to the ED for concern of chest pain, this involves an extensive number of treatment options, and is a complaint that carries with it a high risk of complications and morbidity.  The differential diagnosis includes but is not limited to acute coronary syndrome, aortic dissection, pulmonary embolism, pericarditis, pneumothorax, pneumonia, myocarditis, pleurisy, esophageal rupture   Comorbidities that complicate the patient evaluation: Patients presentation is complicated by their history of Hypertension  Social Determinants of Health: Patients ongoing tobacco use  increases  the complexity of managing their presentation  Additional history obtained: Additional history obtained from family Records reviewed previous admission documents  Lab Tests: I Ordered, and personally interpreted labs.  The pertinent results include: Mild leukocytosis  Imaging Studies ordered: I ordered imaging studies including X-ray chest  I independently visualized and interpreted imaging which showed no acute finding I agree with the radiologist interpretation  Cardiac Monitoring: The patient was maintained on a cardiac monitor.  I personally viewed and interpreted the cardiac monitor which showed an underlying rhythm of:  sinus rhythm  Medicines ordered and prescription drug management: I ordered medication including fentanyl  for pain Reevaluation of the patient after these medicines showed that the patient    stayed the same  Test Considered: Patient is low risk / negative by heart score, therefore do not feel that cardiac admission is indicated.  Critical Interventions:   admission for cardiac workup, heparin  and nitro ordered  Consultations Obtained: I requested consultation with the admitting physician hospitalist Dr. Marcene and consultant cardiology, and discussed  findings as well as pertinent plan - they recommend: Admit and likely have cardiac catheterization later today  Reevaluation: After the interventions noted above, I reevaluated the patient and found that they have :stayed the same  Complexity of problems addressed: Patients presentation is most consistent with  acute presentation with potential threat to life or bodily function  Disposition: After consideration of the diagnostic results and the patients response to treatment,  I feel that the patent would benefit from admission  .   4:38 AM Repeat EKG unchanged Patient still having pain Ordered IV narcotics Patient already received aspirin  prior to arrival     Final diagnoses:  Unstable angina  [facility])    ED Discharge Orders     None          Midge Golas, MD 10/01/24 [phone]  "

## 2024-10-01 NOTE — Progress Notes (Signed)
" °  Echocardiogram 2D Echocardiogram has been performed.  Oveta Idris 10/01/2024, 10:24 AM "

## 2024-10-01 NOTE — Progress Notes (Signed)
 Brief same day note:   Patient is a 59 year old female with history of peripheral artery disease status post aortobifemoral bypass graft in 2017, hypertension, tobacco abuse, GAD, ADD who presented with chest pain..  Pain was on the left side of the chest described as pressure with radiation into the back and left shoulder, new left upper extremity paresthesia/numbness, initiated with nausea.  No history of coronary artery disease.  On prednisone , she was mildly hypertensive.  Initial troponin was less than 15 which increased to 178 later.  EKG did not show any ST changes.  Chest x-ray did not show any acute cardiopulmonary process.  CT chest/abdomen/pelvis did not show any acute process..  Cardiology consulted.  Admitted for further workup.  Echo pending  Patient seen and examined at bedside today.  During my evaluation, she was sitting comfortably on bed.  Denies any chest pain this morning  Assessment and plan:   Chest pain: Scenario as above.  Cardiology following.  Elevated troponin with positive delta.  Given aspirin , nitroglycerin .  Started on heparin  drip.  Echo pending.  Leukocytosis: Most likely reactive.  No suspicion for underlying infectious process.  Continue to hold antibiotics  Hypertension: Continue current medications, lisinopril   Tobacco abuse: Ordered nicotine  patch.  Counseled cessation  History of attention deficit disorder: Takes Adderall as an outpatient

## 2024-10-01 NOTE — ED Notes (Signed)
 Pt transported to cath lab.

## 2024-10-01 NOTE — Progress Notes (Signed)
 PHARMACY - ANTICOAGULATION CONSULT NOTE  Pharmacy Consult for Heparin   Indication: chest pain/ACS  Allergies[1]  Patient Measurements: Height: 5' 7 (170.2 cm) Weight: 104.3 kg (230 lb) IBW/kg (Calculated) : 61.6 HEPARIN  DW (KG): 85.2  Vital Signs: Temp: 97.7 F (36.5 C) (01/09 0450) Temp Source: Oral (01/09 0450) BP: 150/97 (01/09 0230) Pulse Rate: 87 (01/09 0230)  Labs: Recent Labs    09/30/24 2256 10/01/24 0422  HGB 14.4 15.0  HCT 44.7 45.7  PLT 253 259  CREATININE 0.90  --     Estimated Creatinine Clearance: 86.7 mL/min (by C-G formula based on SCr of 0.9 mg/dL).   Medical History: Past Medical History:  Diagnosis Date   Anxiety    panic attacks- has then post op   Asthma    as a child   Attention deficit disorder    Essential hypertension    off  medications, has lost 69 lbs   Headache    History of kidney stones    passed   PAD (peripheral artery disease)    Infrarenal aortic occlusion   Temporomandibular joint disorder     Assessment: 59 y/o F chest pain and mildly elevated troponin. Starting heparin . Labs above reviewed. PTA meds reviewed.   Goal of Therapy:  Heparin  level 0.3-0.7 units/ml Monitor platelets by anticoagulation protocol: Yes   Plan:  Heparin  4000 units BOLUS Start heparin  drip at 1000 units/hr Heparin  level in 8 hours Daily CBC/Heparin  level Monitor for bleeding  Lynwood Mckusick, PharmD, BCPS Clinical Pharmacist Phone: [phone]       [1]  Allergies Allergen Reactions   Bee Venom Anaphylaxis   Penicillin G Anaphylaxis   Penicillins Anaphylaxis    Has patient had a PCN reaction causing immediate rash, facial/tongue/throat swelling, SOB or lightheadedness with hypotension: Yes Has patient had a PCN reaction causing severe rash involving mucus membranes or skin necrosis: No Has patient had a PCN reaction that required hospitalization Yes Has patient had a PCN reaction occurring within the last 10 years: No If all of  the above answers are NO, then may proceed with Cephalosporin use.    Yellow Jacket Venom Anaphylaxis   Benadryl  [Diphenhydramine  Hcl] Other (See Comments)    syncope   Celecoxib Nausea And Vomiting and Rash   Cephalexin  Nausea And Vomiting, Other (See Comments), Rash and Hives    Projectile vomiting, thrush, fever blisters, ED visit   Lidocaine  Swelling and Rash    Foot swelled up like a balloon   Codeine Other (See Comments)    Reflux. Pt states not allergic to hydrocodone  and oxycodone  .

## 2024-10-01 NOTE — H&P (Addendum)
 " History and Physical      Frances Graham FMW:[id number] DOB: 05-03-1966 DOA: 09/30/2024; DOS: 10/01/2024  PCP: Loring Tanda Mae, MD  Patient coming from: home   I have personally briefly reviewed patient's old medical records in [facility]  Chief Complaint: Chest pain  HPI: Frances Graham is a 59 y.o. female with medical history significant for peripheral artery disease status post aortobifemoral bypass graft in October 2017, essential pretension, tobacco abuse, generalized anxiety disorder, ADD, who is admitted to [facility] on 09/30/2024 with chest pain after presenting from home to [facility] ED complaining of chest pain.   The patient reports that she was in her normal state of health earlier this evening and preparing to go to bed, when she developed acute onset of left-sided chest pressure with radiation into the back and radiation into the left shoulder, associated with new left upper extremity paresthesias/numbness.  This is also associated with nausea in the absence of vomiting as well as the presence of diaphoresis.  Denies any associated shortness of breath, palpitations, dizziness, presyncope, or syncope. Symptoms started when she attempted to rotate her upper torso move her cat, which had hopped onto her bed carrying a mouse. Notes continued neck discomfort, worse with right-sided rotation and right-sided sidebending of the neck.  Chest pain has been constant since onset, she notes no significant exertion following onset to evaluate for exertional exacerbation.  Has never previously experienced similar chest pain.  No recent subjective fever, chills or rigors, or generalized myalgias.  No recent worsening of peripheral edema and or any recent calf tenderness or new lower extremity erythema.  Chest pain has been nonpositional, nonpleuritic, and nonreproducible with direct outpatient over the anterior chest wall.  She has a history of peripheral artery disease status  post aortobifemoral bypass graft in October 2017.  She also conveys a history of essential hypertension managed via lifestyle modifications and is a current smoker.  She also notes a history of generalized anxiety disorder as well as attention deficit disorder for which she is on prn Adderall.  She conveys no known history of coronary artery disease.  In preparation for her aortobifemoral bypass graft in October 2017, she underwent preoperative cardiac assessment that included nuclear medicine myocardial stress test in October 2017, which showed no evidence for reversible ischemia.  Additionally, her most recent echocardiogram occurred in October 2017 was notable for LVEF 60 to 65%, no evidence of focal wall motion abnormalities, showing normal left ventricular diastolic parameters and no evidence of significant valvular pathology.  No history of prior coronary angiography.  While still at home this evening, the patient subsequently took full dose aspirin  x 1 dose and contacted EMS, who subsidy brought the patient to [facility] emergency department for further evaluation and management thereof.  She received a single dose of sublingual nitroglycerin  via EMS, noting some improvement in the intensity of her chest discomfort.     ED Course:  Vital signs in the ED were notable for the following: Afebrile; heart rates initially in the low 100s, subsequently decreasing into the 80s with some improvement in the intensity of her chest pain; systolic pressures in the range of 130s to 170s, with most recent systolic blood pressures noted to be in the 150s; respiratory rate 16-20, oxygen saturation 95 to 100% on room air.  Labs were notable for the following: BMP notable for the following: Sodium 137, potassium 4.4, bicarbonate 24, creatinine 0.90, glucose 126.  High-sensitivity troponin T was  initially less than 15, with repeat trending up to 48.  No prior high-sensitivity troponin T data points available for  point comparison.  CBC notable for white cell count 12,700, hemoglobin 14.4, platelet count 253.  Per my interpretation, EKG in ED demonstrated the following: Sinus rhythm with heart rate 83, normal intervals, no evidence of T wave or ST changes, including no evidence of ST elevation.  Imaging in the ED, per corresponding formal radiology read, was notable for the following: 2 view chest x-ray showed no evidence of acute cardiopulmonary process, including no evidence of infiltrate, edema, effusion, or pneumothorax.  CTA chest, abdomen, pelvis with dissection protocol showed no evidence of acute process, including no evidence of acute aortic syndrome, including no evidence of aortic dissection or aneurysm.  This CT imaging showed no evidence of acute cardiopulmonary process, including no evidence of infiltrate, edema, effusion, or pneumothorax, but did demonstrate scattered coronary artery atherosclerotic disease as well as scattered aortic atherosclerotic disease.  EDP discussed patient's case with on-call cardiology fellow, Dr. Gail, who recommended hospitalist admission, and conveyed that cardiology will formally consult, with possibility of taking the patient for left heart cath later today.  Dr. Gail recommended initiation of heparin  drip.   While in the ED, the following were administered: Fentanyl  100 mcg IV x 1 dose, Norco 5/325 mg p.o. x 3 tabs, Zofran  4 mg IV x 1, nitroglycerin  paste, heparin  bolus followed by initiation of heparin  drip.  Subsequently, the patient was admitted for further evaluation management of presenting chest pain.      Review of Systems: As per HPI otherwise 10 point review of systems negative.   Past Medical History:  Diagnosis Date   Anxiety    panic attacks- has then post op   Asthma    as a child   Attention deficit disorder    Essential hypertension    off  medications, has lost 69 lbs   Headache    History of kidney stones    passed   PAD  (peripheral artery disease)    Infrarenal aortic occlusion   Temporomandibular joint disorder     Past Surgical History:  Procedure Laterality Date   AMPUTATION Right 08/01/2016   Procedure: AMPUTATION GREAT TOE;  Surgeon: Lonni GORMAN Blade, MD;  Location: [facility] OR;  Service: Vascular;  Laterality: Right;   AMPUTATION TOE Right 08/01/2016   AORTA - BILATERAL FEMORAL ARTERY BYPASS GRAFT Bilateral 06/28/2016   Procedure: AORTOBIFEMORAL BYPASS GRAFT;  Surgeon: Lonni GORMAN Blade, MD;  Location: [facility] OR;  Service: Vascular;  Laterality: Bilateral;   BIOPSY THYROID      INCISIONAL HERNIA REPAIR N/A 01/20/2020   Procedure: LAPAROSCOPIC INCISIONAL HERNIA REPAIR WITH MESH;  Surgeon: Vernetta Berg, MD;  Location: [facility] OR;  Service: General;  Laterality: N/A;   LEG SURGERY Left 1970   pin  to fix cogental hip   MENISCUS REPAIR Left    2015   TOOTH EXTRACTION Bilateral 10/23/2018   Procedure: DENTAL RESTORATION/EXTRACTIONS;  Surgeon: Sheryle Hamilton, DDS;  Location: [facility] OR;  Service: Oral Surgery;  Laterality: Bilateral;    Social History:  reports that she has been smoking cigarettes. She has a 5 pack-year smoking history. She has never used smokeless tobacco. She reports that she does not drink alcohol and does not use drugs.   Allergies[1]  Family History  Problem Relation Age of Onset   COPD Mother    CAD Mother    Diabetes Maternal Uncle    CAD Maternal Grandmother  Breast cancer Paternal Grandmother        30s    Family history reviewed and not pertinent    Prior to Admission medications  Medication Sig Start Date End Date Taking? Authorizing Provider  amphetamine -dextroamphetamine  (ADDERALL) 20 MG tablet Take 20 mg by mouth daily as needed (attention/focus/ADD).    [provider]  SUMAtriptan (IMITREX) 100 MG tablet TAKE 1 TABLET BY MOUTH AS NEEDED FOR MIGRAINE AND REPEAT IN 2 HOURS. ONLY UP TO A MAX OF 2 PER DAY 12/31/19   [provider]  traZODone (DESYREL)  150 MG tablet Take 150 mg by mouth at bedtime as needed for sleep.    [provider]     Objective    Physical Exam: Vitals:   10/01/24 0015 10/01/24 0115 10/01/24 0200 10/01/24 0230  BP: (!) 170/92 133/75 (!) 153/80 (!) 150/97  Pulse: 94 89 88 87  Resp: 17 20 16 20   Temp:      SpO2: 100% 98% 96% 95%  Weight:      Height:        General: appears to be stated age; alert, oriented Skin: warm, dry, no rash Head:  AT/[place] Mouth:  Oral mucosa membranes appear moist, normal dentition Neck: supple; trachea midline Heart:  RRR; did not appreciate any M/R/G Lungs: CTAB, did not appreciate any wheezes, rales, or rhonchi Abdomen: + BS; soft, ND, NT Extremities: no peripheral edema, no muscle wasting         Labs on Admission: I have personally reviewed following labs and imaging studies  CBC: Recent Labs  Lab 09/30/24 2256  WBC 12.7*  HGB 14.4  HCT 44.7  MCV 96.1  PLT 253   Basic Metabolic Panel: Recent Labs  Lab 09/30/24 2256  NA 137  K 4.4  CL 104  CO2 24  GLUCOSE 126*  BUN 15  CREATININE 0.90  CALCIUM  8.4*   GFR: Estimated Creatinine Clearance: 86.7 mL/min (by C-G formula based on SCr of 0.9 mg/dL). Liver Function Tests: No results for input(s): AST, ALT, ALKPHOS, BILITOT, PROT, ALBUMIN  in the last 168 hours. No results for input(s): LIPASE, AMYLASE in the last 168 hours. No results for input(s): AMMONIA in the last 168 hours. Coagulation Profile: No results for input(s): INR, PROTIME in the last 168 hours. Cardiac Enzymes: No results for input(s): CKTOTAL, CKMB, CKMBINDEX, TROPONINI in the last 168 hours. BNP (last 3 results) No results for input(s): PROBNP in the last 8760 hours. HbA1C: No results for input(s): HGBA1C in the last 72 hours. CBG: No results for input(s): GLUCAP in the last 168 hours. Lipid Profile: No results for input(s): CHOL, HDL, LDLCALC, TRIG, CHOLHDL, LDLDIRECT in the last  72 hours. Thyroid  Function Tests: No results for input(s): TSH, T4TOTAL, FREET4, T3FREE, THYROIDAB in the last 72 hours. Anemia Panel: No results for input(s): VITAMINB12, FOLATE, FERRITIN, TIBC, IRON, RETICCTPCT in the last 72 hours. Urine analysis:    Component Value Date/Time   COLORURINE YELLOW 11/10/2023 1215   APPEARANCEUR CLEAR 11/10/2023 1215   LABSPEC 1.025 11/10/2023 1215   PHURINE 5.5 11/10/2023 1215   GLUCOSEU NEGATIVE 11/10/2023 1215   HGBUR NEGATIVE 11/10/2023 1215   BILIRUBINUR NEGATIVE 11/10/2023 1215   KETONESUR NEGATIVE 11/10/2023 1215   PROTEINUR NEGATIVE 11/10/2023 1215   UROBILINOGEN 0.2 08/08/2013 1430   NITRITE NEGATIVE 11/10/2023 1215   LEUKOCYTESUR NEGATIVE 11/10/2023 1215    Radiological Exams on Admission: CT Angio Chest/Abd/Pel for Dissection W and/or Wo Contrast Result Date: 10/01/2024 EXAM: CTA CHEST, ABDOMEN  AND PELVIS WITHOUT AND WITH CONTRAST 10/01/2024 12:51:49 AM TECHNIQUE: CTA of the chest was performed without and with the administration of intravenous contrast. CTA of the abdomen and pelvis was performed without and with the administration of intravenous contrast. 100 mL of iohexol  (OMNIPAQUE ) 350 MG/ML injection was administered. Multiplanar reformatted images are provided for review. MIP images are provided for review. Automated exposure control, iterative reconstruction, and/or weight based adjustment of the mA/kV was utilized to reduce the radiation dose to as low as reasonably achievable. COMPARISON: 11/10/2023 CLINICAL HISTORY: Acute aortic syndrome (AAS) suspected. FINDINGS: VASCULATURE: AORTA: Scattered aortic atherosclerosis. Distal aortobifemoral bypass. No acute finding. No abdominal aortic aneurysm. No dissection. PULMONARY ARTERIES: No pulmonary embolism with the limits of this exam. GREAT VESSELS OF AORTIC ARCH: No acute finding. No dissection. No arterial occlusion or significant stenosis. CELIAC TRUNK: No acute  finding. No occlusion or significant stenosis. SUPERIOR MESENTERIC ARTERY: No acute finding. No occlusion or significant stenosis. INFERIOR MESENTERIC ARTERY: No acute finding. No occlusion or significant stenosis. RENAL ARTERIES: No acute finding. No occlusion or significant stenosis. ILIAC ARTERIES: No acute finding. No occlusion or significant stenosis. CHEST: MEDIASTINUM: Scattered coronary artery atherosclerosis. No mediastinal lymphadenopathy. The heart and pericardium demonstrate no acute abnormality. LUNGS AND PLEURA: The lungs are without acute process. No focal consolidation or pulmonary edema. No evidence of pleural effusion or pneumothorax. THORACIC BONES AND SOFT TISSUES: Right thyroid  nodule measures up to 2.6 cm. This has been previously evaluated by ultrasound and biopsy. No acute bone or soft tissue abnormality. ABDOMEN AND PELVIS: LIVER: The liver is unremarkable. GALLBLADDER AND BILE DUCTS: The gallbladder is contracted. 1.5 cm gallstone noted. No biliary ductal dilatation. SPLEEN: The spleen is unremarkable. PANCREAS: The pancreas is unremarkable. ADRENAL GLANDS: Bilateral adrenal glands demonstrate no acute abnormality. KIDNEYS, URETERS AND BLADDER: No stones in the kidneys or ureters. No hydronephrosis. No perinephric or periureteral stranding. Urinary bladder is unremarkable. GI AND BOWEL: Stomach and duodenal sweep demonstrate no acute abnormality. Normal appendix. There is no bowel obstruction. No abnormal bowel wall thickening or distension. REPRODUCTIVE: Reproductive organs are unremarkable. PERITONEUM AND RETROPERITONEUM: No ascites or free air. LYMPH NODES: No lymphadenopathy. ABDOMINAL BONES AND SOFT TISSUES: No acute abnormality of the bones. No acute soft tissue abnormality. IMPRESSION: 1. No acute aortic syndrome. 2. Aortobifemoral bypass. 3. Scattered coronary artery and aortic atherosclerosis. 4. No acute findings in the chest, abdomen, or pelvis. Electronically signed by: Franky Crease MD MD 10/01/2024 12:58 AM EST RP Workstation: [id number]   DG Chest 2 View Result Date: 09/30/2024 CLINICAL DATA:  Chest pain EXAM: CHEST - 2 VIEW COMPARISON:  08/07/2016 FINDINGS: The heart size and mediastinal contours are within normal limits. Both lungs are clear. The visualized skeletal structures are unremarkable. IMPRESSION: No active cardiopulmonary disease. Electronically Signed   By: Luke Bun M.D.   On: 09/30/2024 23:22      Assessment/Plan   Principal Problem:   Chest pain Active Problems:   Leukocytosis   Tobacco abuse   ADD (attention deficit disorder)   Elevated troponin   History of essential hypertension   GAD (generalized anxiety disorder)      #) Chest Pain: Acute onset of left-sided chest pressure radiating into the back and into the left shoulder associated with left upper extremity numbness/paresthesias, nausea, and diaphoresis, with chest pain improving with nitroglycerin , including application of nitroglycerin  paste in the ED, and this patient with a known history of atherosclerotic disease in the context of her documented history of peripheral artery  disease status post aortobifemoral bypass graft, with multiple risk factors for progression of atherosclerotic disease, including a history of essential hypertension, current smoker, as well as pro inflammatory state associated with use of prn Adderall, with this evening's CTA chest, pelvis with dissection protocol showing evidence of scattered coronary artery atherosclerotic disease.  No interval exertion to assess for element of exertional exacerbation of her presenting chest pain.   EKG shows sinus rhythm without overt evidence of acute ischemic changes, including no evidence of STEMI.  Initial troponin less than 15, repeat troponin trending up slightly to 40.  CTA chest showed no evidence of acute process, including no evidence of aortic dissection/aneurysm or any evidence of pneumothorax.   In the  context of the presence of presenting new onset chest pain with some typical features in this patient with evidence of atherosclerotic coronary artery disease on CTA and multiple CAD risk factors, without any recent coronary ischemic evaluation since October 2017, when nuclear medicine myocardial stress test showed no evidence of reversible ischemia, we will admit the patient for further evaluation and management of her presenting chest pain, with differential that currently includes unstable angina.   presenting HEART score is noted to be 5 conveying a 12-16.6% risk of major acute cardiac event over the ensuing 6 weeks.   Patient received full dose aspirin  at home earlier this evening.   EDP discussed patient's case with on-call cardiology fellow, Dr. Gail, who recommended hospitalist admission, and conveyed that cardiology will formally consult, with possibility of taking the patient for left heart cath later today.  Dr. Gail recommended initiation of heparin  drip.   Differential also includes the possibility of coronary artery vasospasm given outpatient use of Adderall, which could contribute to both her presenting chest discomfort as well as mild elevation in troponin.  Clinically, acute pulmonary embolism is felt to be less likely at this time.  Will also check lipase, liver enzymes, urinary drug screen.   Plan: trend serial troponin. Monitor on telemetry. PRN IV fentanyl  for chest pain refractory to existing nitroglycerin  paste. PRN EKG for subsequent episodes of worsening chest pain. Check serum Mg level. CMP in the AM.  Check lipase, urinary drug screen.  Repeat CBC in the AM.  Cardiology to formally consult, as above.  Continue heparin  drip.  Echocardiogram ordered for the morning.  As needed IV Ativan  for anxiety.  Robaxin  500 mg IV x 1 dose for residual neck discomfort.                        #) Leukocytosis: Presenting CBC reflects mildly elevated white cell count of  12,700.  Potentially inflammatory/reactive in nature with similar differential relative to considered causes for her presenting chest pain, as above.  No evidence of overt underlying infectious process at this time, including CT chest which showed no evidence of infiltrate to suggest pneumonia, while CT abdomen, pelvis, showed no evidence of acute intra-abdominal infectious process.  Will also check urinalysis.  In the absence of underlying infectious process, criteria for sepsis are not currently met.  She appears hemodynamically stable.  Consequently, we will refrain from initiation of antibiotics at this time.  Plan: Repeat CBC with diff in the morning.  Monitor strict I's and O's, daily weights.  Further evaluation and management of presenting chest pain, as above.  Check urinary drug screen.  Check urinalysis.                      #)  Essential Hypertension: documented h/o such.manage she is placed on medications as an outpatient, the absence of any dedicated antihypertensive medications at home.  Systolic blood pressures in the ED today noted to be in the 130s to 170s, with most recent systolic blood pressures trending down into the 140s to 150s mmHg following application of nitro paste .   Plan: Close monitoring of subsequent BP via routine VS. monitor strict I's and O's and daily weights.  Monitor on telemetry.                         #) Chronic tobacco abuse: Patient conveys that they are a current smoker, having smoked half ppd for approximately 10 years.  Notable history in the context of the patient's presenting chest discomfort with CTA evidence of scattered coronary artery atherosclerotic disease as well as a known history of peripheral artery disease status post aortobifemoral bypass graft in October 2017.   Plan:  Order placed for prn nicotine  patch for use during this hospitalization.                           #)  Generalized anxiety disorder: documented h/o such.  Not on any SSRI or SNRI as an outpatient, nor any prn anxiety lytic medications at home.  Plan: As needed IV Ativan  for anxiety.  Hold home prn Adderall for now.                      #) History of attention deficit disorder: Documented history of such, for which the patient is on prn Adderall as an outpatient.  Notable history given increased risk for coronary artery vasospasm.   Plan: Hold home prn Adderall for now.  Add on magnesium  level.       DVT prophylaxis: Heparin  drip Code Status: Full code Family Communication: none Disposition Plan: Per Rounding Team Consults called: EDP discussed patient's case with on-call cardiology fellow, Dr. Gail, who recommended hospitalist admission, and conveyed that cardiology will formally consult, with possibility of taking the patient for left heart cath later today.  Dr. Gail recommended initiation of heparin  drip. ;  Admission status: obs     I SPENT GREATER THAN 75  MINUTES IN CLINICAL CARE TIME/MEDICAL DECISION-MAKING IN COMPLETING THIS ADMISSION.      Eva NOVAK Jeferson Boozer DO [facility]  From 7PM - 7AM   10/01/2024, 4:17 AM         [1]  Allergies Allergen Reactions   Bee Venom Anaphylaxis   Penicillin G Anaphylaxis   Penicillins Anaphylaxis    Has patient had a PCN reaction causing immediate rash, facial/tongue/throat swelling, SOB or lightheadedness with hypotension: Yes Has patient had a PCN reaction causing severe rash involving mucus membranes or skin necrosis: No Has patient had a PCN reaction that required hospitalization Yes Has patient had a PCN reaction occurring within the last 10 years: No If all of the above answers are NO, then may proceed with Cephalosporin use.    Yellow Jacket Venom Anaphylaxis   Benadryl  [Diphenhydramine  Hcl] Other (See Comments)    syncope   Celecoxib Nausea And Vomiting and Rash   Cephalexin  Nausea And  Vomiting, Other (See Comments), Rash and Hives    Projectile vomiting, thrush, fever blisters, ED visit   Lidocaine  Swelling and Rash    Foot swelled up like a balloon   Codeine Other (See Comments)    Reflux. Pt states  not allergic to hydrocodone  and oxycodone  .   "

## 2024-10-01 NOTE — ED Notes (Signed)
Pt ambulated to rr

## 2024-10-01 NOTE — ED Notes (Signed)
 CCMD called and pt placed on the monitor.

## 2024-10-02 ENCOUNTER — Other Ambulatory Visit (HOSPITAL_COMMUNITY): Payer: Self-pay

## 2024-10-02 ENCOUNTER — Inpatient Hospital Stay (HOSPITAL_COMMUNITY)

## 2024-10-02 LAB — CBC
HCT: 43.1 % (ref 36.0–46.0)
Hemoglobin: 14.1 g/dL (ref 12.0–15.0)
MCH: 30.7 pg (ref 26.0–34.0)
MCHC: 32.7 g/dL (ref 30.0–36.0)
MCV: 93.9 fL (ref 80.0–100.0)
Platelets: 242 K/uL (ref 150–400)
RBC: 4.59 MIL/uL (ref 3.87–5.11)
RDW: 13.2 % (ref 11.5–15.5)
WBC: 10 K/uL (ref 4.0–10.5)
nRBC: 0 % (ref 0.0–0.2)

## 2024-10-02 MED ORDER — LISINOPRIL 20 MG PO TABS
20.0000 mg | ORAL_TABLET | Freq: Every day | ORAL | 0 refills | Status: AC
  Filled 2024-10-02: qty 60, 60d supply, fill #0

## 2024-10-02 MED ORDER — NICOTINE 14 MG/24HR TD PT24
14.0000 mg | MEDICATED_PATCH | Freq: Every day | TRANSDERMAL | 0 refills | Status: AC | PRN
  Filled 2024-10-02: qty 24, 24d supply, fill #0
  Filled 2024-10-02: qty 21, 21d supply, fill #0
  Filled 2024-10-02: qty 24, 24d supply, fill #0

## 2024-10-02 MED ORDER — METOPROLOL SUCCINATE ER 25 MG PO TB24
25.0000 mg | ORAL_TABLET | Freq: Every day | ORAL | 0 refills | Status: AC
  Filled 2024-10-02: qty 60, 60d supply, fill #0

## 2024-10-02 MED ORDER — ROSUVASTATIN CALCIUM 20 MG PO TABS
20.0000 mg | ORAL_TABLET | Freq: Every day | ORAL | 0 refills | Status: AC
  Filled 2024-10-02: qty 60, 60d supply, fill #0

## 2024-10-02 MED ORDER — METOPROLOL SUCCINATE ER 25 MG PO TB24
25.0000 mg | ORAL_TABLET | Freq: Every day | ORAL | Status: DC
  Administered 2024-10-02: 25 mg via ORAL
  Filled 2024-10-02: qty 1

## 2024-10-02 MED ORDER — IOHEXOL 350 MG/ML SOLN
75.0000 mL | Freq: Once | INTRAVENOUS | Status: AC | PRN
  Administered 2024-10-02: 75 mL via INTRAVENOUS

## 2024-10-02 MED ORDER — ROSUVASTATIN CALCIUM 20 MG PO TABS
20.0000 mg | ORAL_TABLET | Freq: Every day | ORAL | Status: DC
  Administered 2024-10-02: 20 mg via ORAL
  Filled 2024-10-02: qty 1

## 2024-10-02 MED ORDER — CLOPIDOGREL BISULFATE 75 MG PO TABS
75.0000 mg | ORAL_TABLET | Freq: Every day | ORAL | 0 refills | Status: AC
  Filled 2024-10-02: qty 60, 60d supply, fill #0

## 2024-10-02 MED ORDER — ASPIRIN 81 MG PO TBEC
81.0000 mg | DELAYED_RELEASE_TABLET | Freq: Every day | ORAL | 0 refills | Status: AC
  Filled 2024-10-02: qty 60, 60d supply, fill #0

## 2024-10-02 NOTE — Plan of Care (Signed)
" °  Problem: Education: Goal: Knowledge of General Education information will improve Description: Including pain rating scale, medication(s)/side effects and non-pharmacologic comfort measures Outcome: Adequate for Discharge   Problem: Health Behavior/Discharge Planning: Goal: Ability to manage health-related needs will improve Outcome: Adequate for Discharge   Problem: Clinical Measurements: Goal: Ability to maintain clinical measurements within normal limits will improve Outcome: Adequate for Discharge Goal: Will remain free from infection Outcome: Adequate for Discharge Goal: Diagnostic test results will improve Outcome: Adequate for Discharge Goal: Respiratory complications will improve Outcome: Adequate for Discharge Goal: Cardiovascular complication will be avoided Outcome: Adequate for Discharge   Problem: Nutrition: Goal: Adequate nutrition will be maintained Outcome: Adequate for Discharge   Problem: Coping: Goal: Level of anxiety will decrease Outcome: Adequate for Discharge   Problem: Elimination: Goal: Will not experience complications related to bowel motility Outcome: Adequate for Discharge Goal: Will not experience complications related to urinary retention Outcome: Adequate for Discharge   Problem: Pain Managment: Goal: General experience of comfort will improve and/or be controlled Outcome: Adequate for Discharge   Problem: Safety: Goal: Ability to remain free from injury will improve Outcome: Adequate for Discharge   Problem: Skin Integrity: Goal: Risk for impaired skin integrity will decrease Outcome: Adequate for Discharge   Problem: Education: Goal: Understanding of CV disease, CV risk reduction, and recovery process will improve Outcome: Adequate for Discharge Goal: Individualized Educational Video(s) Outcome: Adequate for Discharge   Problem: Activity: Goal: Ability to return to baseline activity level will improve Outcome: Adequate for  Discharge   Problem: Cardiovascular: Goal: Ability to achieve and maintain adequate cardiovascular perfusion will improve Outcome: Adequate for Discharge Goal: Vascular access site(s) Level 0-1 will be maintained Outcome: Adequate for Discharge   Problem: Health Behavior/Discharge Planning: Goal: Ability to safely manage health-related needs after discharge will improve Outcome: Adequate for Discharge   "

## 2024-10-02 NOTE — Progress Notes (Signed)
"  °  Progress Note  Patient Name: Frances Graham Date of Encounter: 10/02/2024 [facility] Cardiologist: Lonni Cash, MD   Interval Summary   No events overnight. She denies any CP or dyspnea.   Vital Signs Vitals:   10/02/24 0024 10/02/24 0305 10/02/24 0441 10/02/24 0718  BP:   106/68 (!) 142/94  Pulse:   81 88  Resp:   18 18  Temp:   98.4 F (36.9 C) 97.9 F (36.6 C)  TempSrc:   Oral Oral  SpO2:   92% 92%  Weight: 94.3 kg 94.3 kg    Height:        Intake/Output Summary (Last 24 hours) at 10/02/2024 0917 Last data filed at 10/01/2024 1956 Gross per 24 hour  Intake 1240 ml  Output --  Net 1240 ml      10/02/2024    3:05 AM 10/02/2024   12:24 AM 09/30/2024   10:52 PM  Last 3 Weights  Weight (lbs) 207 lb 14.4 oz 207 lb 14.4 oz 230 lb  Weight (kg) 94.303 kg 94.303 kg 104.327 kg      Telemetry/ECG  NSR - Personally Reviewed  Physical Exam  GEN: No acute distress.   Neck: No JVD Cardiac: RRR, no murmurs, rubs, or gallops.  Respiratory: Clear to auscultation bilaterally. GI: Soft, nontender, non-distended  MS: No edema  Assessment & Plan   Frances Graham is a 59 year old female with history of PAD status post aortobifemoral bypass who presented with chest pain and troponin elevation up to 178.  She underwent left heart cath on 1 9 that showed nonobstructive CAD.  TTE with EF 55 to 60%, moderately enlarged RV, and McConnell sign.  #CP c/f ACS #PAD s/p aortobifemoral bypass - Seems to be asymptomatic at this time.  Given episode of chest pain and nonobstructive CAD on cath, we will start metoprolol  25 mg daily for angina. - Given McConnell sign and moderately dilated RV, CT PE performed today with no evidence of pulmonary embolism. - Continue aspirin  and Plavix  indefinitely. - Start rosuvastatin  20 mg daily.  Unclear why patient has not been on this. - Follow-up with LPA level. - We will sign off; feel free to call us  back if you have any  questions  For questions or updates, please contact [facility] Please consult [email or web address] for contact info under       Signed, Joelle VEAR Ren Donley, MD  "

## 2024-10-02 NOTE — Discharge Summary (Signed)
 Physician Discharge Summary  VICTOIRE DEANS FMW:[id number] DOB: [birth year] DOA: 09/30/2024  PCP: Loring Tanda Mae, MD  Admit date: 09/30/2024 Discharge date: 10/02/2024  Admitted From: Home Disposition:  Home  Discharge Condition:Stable CODE STATUS:FULL Diet recommendation: Heart Healthy   Brief/Interim Summary: Patient is a 59 year old female with history of peripheral artery disease status post aortobifemoral bypass graft in 2017, hypertension, tobacco abuse, GAD, ADD who presented with chest pain..  Pain was on the left side of the chest described as pressure with radiation into the back and left shoulder, new left upper extremity paresthesia/numbness, initiated with nausea.  No history of coronary artery disease.  On prednisone , she was mildly hypertensive.  Initial troponin was less than 15 which increased to 178 later.  EKG did not show any ST changes.  Chest x-ray did not show any acute cardiopulmonary process.  CT chest/abdomen/pelvis did not show any acute process..  Cardiology consulted.  Admitted for further workup .  Underwent cardiac cath which showed moderate coronary artery disease, heavy plaque burden on the RCA.  Cardiology recommended aspirin  and Plavix  on discharge.  She is chest pain-free today.  Medically stable for discharge to home today.  Cardiology cleared for discharge.  Following problems were addressed during the hospitalization:  Chest pain: Elevated troponin with positive delta.  Given aspirin , nitroglycerin .  Started on heparin  drip.  Underwent cardiac cath which showed moderate coronary artery disease, heavy plaque burden on the RCA.  Cardiology recommended aspirin  and Plavix  on discharge.  Echo showed EF of 55 to 60%, grade 1 diastolic dysfunction, low normal right ventricular function.  Concern for positive McConnell sign.  CT angiogram repeated, no evidence of PE.  Comfortable, chest pain-free this morning.  Started on Crestor , metoprolol .   Leukocytosis:  Most likely reactive.  No suspicion for underlying infectious process.  Resolved   Hypertension: Continue current medications, lisinopril  and metoprolol    Tobacco abuse: Ordered nicotine  patch.  Counseled cessation   History of attention deficit disorder: Takes Adderall as an outpatient     Discharge Diagnoses:  Principal Problem:   Chest pain Active Problems:   Leukocytosis   Tobacco abuse   ADD (attention deficit disorder)   Elevated troponin   History of essential hypertension   GAD (generalized anxiety disorder)   Non-ST elevation (NSTEMI) myocardial infarction [facility])    Discharge Instructions  Discharge Instructions     Diet - low sodium heart healthy   Complete by: As directed    Discharge instructions   Complete by: As directed    1)Please take your medications as instructed 2)Follow up with your PCP in a week 3)Stop smoking 4)Follow up with your cardiologist as an outpatient   Increase activity slowly   Complete by: As directed       Allergies as of 10/02/2024       Reactions   Bee Venom Anaphylaxis   Penicillins Anaphylaxis   Has patient had a PCN reaction causing immediate rash, facial/tongue/throat swelling, SOB or lightheadedness with hypotension: Yes Has patient had a PCN reaction causing severe rash involving mucus membranes or skin necrosis: No Has patient had a PCN reaction that required hospitalization Yes Has patient had a PCN reaction occurring within the last 10 years: No If all of the above answers are NO, then may proceed with Cephalosporin use.   Yellow Jacket Venom Anaphylaxis   Celecoxib Nausea And Vomiting, Rash   Cephalexin  Nausea And Vomiting, Other (See Comments), Rash, Hives   Projectile vomiting, thrush, fever blisters, ED  visit   Lidocaine  Swelling, Rash   Foot swelled up like a balloon   Codeine Other (See Comments)   Reflux. Pt states not allergic to hydrocodone  and oxycodone  .        Medication List     STOP taking  these medications    dicyclomine  20 MG tablet Commonly known as: BENTYL    lisinopril -hydrochlorothiazide  20-25 MG tablet Commonly known as: ZESTORETIC    ondansetron  8 MG tablet Commonly known as: ZOFRAN    oxyCODONE -acetaminophen  10-325 MG tablet Commonly known as: PERCOCET       TAKE these medications    amphetamine -dextroamphetamine  20 MG tablet Commonly known as: ADDERALL Take 20 mg by mouth daily as needed (attention/focus/ADD).   aspirin  EC 81 MG tablet Take 1 tablet (81 mg total) by mouth daily. Swallow whole.   clopidogrel  75 MG tablet Commonly known as: PLAVIX  Take 1 tablet (75 mg total) by mouth daily with breakfast. Start taking on: October 03, 2024   EPINEPHrine  0.3 mg/0.3 mL Soaj injection Commonly known as: EPI-PEN Inject 0.3 mg into the muscle as directed.   lisinopril  20 MG tablet Commonly known as: ZESTRIL  Take 1 tablet (20 mg total) by mouth daily.   metoprolol  succinate 25 MG 24 hr tablet Commonly known as: TOPROL -XL Take 1 tablet (25 mg total) by mouth daily. Start taking on: October 03, 2024   nicotine  14 mg/24hr patch Commonly known as: NICODERM CQ  - dosed in mg/24 hours Place 1 patch (14 mg total) onto the skin daily as needed (smoking cessation).   ondansetron  8 MG disintegrating tablet Commonly known as: ZOFRAN -ODT Take 8 mg by mouth every 8 (eight) hours as needed for nausea, vomiting or refractory nausea / vomiting.   Oxycodone  HCl 10 MG Tabs Take 10 mg by mouth every 6 (six) hours.   rosuvastatin  20 MG tablet Commonly known as: CRESTOR  Take 1 tablet (20 mg total) by mouth daily. Start taking on: October 03, 2024   SUMAtriptan 100 MG tablet Commonly known as: IMITREX TAKE 1 TABLET BY MOUTH AS NEEDED FOR MIGRAINE AND REPEAT IN 2 HOURS. ONLY UP TO A MAX OF 2 PER DAY   traZODone 150 MG tablet Commonly known as: DESYREL Take 150 mg by mouth at bedtime as needed for sleep.        Follow-up Information     Loring Tanda Mae, MD. Schedule an appointment as soon as possible for a visit in 1 week(s).   Specialty: Family Medicine Contact information: [address] [place] [address] [phone]                Allergies[1]  Consultations: Cardiology   Procedures/Studies: CT Angio Chest Pulmonary Embolism (PE) W or WO Contrast Result Date: 10/02/2024 CLINICAL DATA:  Clinical concern for pulmonary embolus. EXAM: CT ANGIOGRAPHY CHEST WITH CONTRAST TECHNIQUE: Multidetector CT imaging of the chest was performed using the standard protocol during bolus administration of intravenous contrast. Multiplanar CT image reconstructions and MIPs were obtained to evaluate the vascular anatomy. RADIATION DOSE REDUCTION: This exam was performed according to the departmental dose-optimization program which includes automated exposure control, adjustment of the mA and/or kV according to patient size and/or use of iterative reconstruction technique. CONTRAST:  75mL OMNIPAQUE  IOHEXOL  350 MG/ML SOLN COMPARISON:  10/01/2024 FINDINGS: Cardiovascular: The heart size is normal. No substantial pericardial effusion. Coronary artery calcification is evident. Mild atherosclerotic calcification is noted in the wall of the thoracic aorta. There is no filling defect within the opacified pulmonary arteries to suggest the  presence of an acute pulmonary embolus. Mediastinum/Nodes: No mediastinal lymphadenopathy. There is no hilar lymphadenopathy. The esophagus has normal imaging features. There is no axillary lymphadenopathy. Stable right thyroid  mass. This has been evaluated on previous imaging. (ref: J Am Coll Radiol. 2015 Feb;12(2): 143-50). Lungs/Pleura: Centrilobular emphsyema noted. 7 mm ground-glass nodule identified central right lung on 60/11. 4 mm left upper lobe nodule identified on 61/11. No focal airspace consolidation. No pulmonary edema or pleural effusion. Upper Abdomen: Gallbladder is distended with calcified gallstone  evident. Musculoskeletal: No worrisome lytic or sclerotic osseous abnormality. Review of the MIP images confirms the above findings. IMPRESSION: 1. No CT evidence for acute pulmonary embolus. 2. 7 mm ground-glass nodule identified central right lung. Initial follow-up with CT at 6-12 months is recommended to confirm persistence. If persistent, repeat CT is recommended every 2 years until 5 years of stability has been established. This recommendation follows the consensus statement: Guidelines for Management of Incidental Pulmonary Nodules Detected on CT Images: From the Fleischner Society 2017; Radiology 2017; 284:228-243. 3. 4 mm left upper lobe pulmonary nodule. Likely benign, attention on follow-up recommended. 4. Cholelithiasis. 5.  Aortic Atherosclerosis (ICD10-I70.0). Electronically Signed   By: Camellia Candle M.D.   On: 10/02/2024 09:01   CARDIAC CATHETERIZATION Result Date: 10/01/2024 Images from the original result were not included. Cardiac Catheterization 10/01/2024: Hemodynamic data: LV 117/19, EDP 12 mmHg.  Ao 98/71, mean 86 mmHg.  There is no pressure gradient across the aortic valve. Angiographic data: LM: Large-caliber vessel, smooth and normal. LCx: Gives origin to high large OM1, small OM 2, moderate-sized OM 3 and a tiny OM 4.  OM 4 has a mid 30% stenosis. LAD: Large-caliber vessel, has mild 20% tandem stenosis in the proximal segment.  Small to moderate-sized D1.  Mid segment of the LAD has a 40% stenosis. RCA: Large-caliber vessel, proximal segment has a 50% stenosis with heavy plaque burden.  There is a tandem mid 50% stenosis again appears to have significant amount of plaque burden.  The PDA has about a 40% focal stenosis. Impression and recommendations: Patient has moderate coronary artery disease and has heavy plaque burden especially in the right coronary artery, she would benefit from being on DAPT with aspirin  and Plavix  for at least 6 months and probably in view of PAD Plavix   indefinitely.  Smoking cessation has been discussed with the patient.   ECHOCARDIOGRAM COMPLETE Result Date: 10/01/2024    ECHOCARDIOGRAM REPORT   Patient Name:   TOMORROW DEHAAS [facility] Date of Exam: 10/01/2024 Medical Rec #:  [id number]         Height:       67.0 in Accession #:    [id number]        Weight:       230.0 lb Date of Birth:  [birth year]        BSA:          2.146 m Patient Age:    58 years          BP:           155/92 mmHg Patient Gender: F                 HR:           80 bpm. Exam Location:  Inpatient Procedure: 2D Echo (Both Spectral and Color Flow Doppler were utilized during            procedure). Indications:    chest pain  History:  Patient has prior history of Echocardiogram examinations, most                 recent 06/25/2016. PAD, Signs/Symptoms:Chest Pain; Risk                 Factors:Hypertension and Current Smoker.  Sonographer:    Tinnie Barefoot RDCS Referring Phys: [id number] EVA KATHEE PORE  Sonographer Comments: Global longitudinal strain was attempted. IMPRESSIONS  1. Akinesis of the RV mid free wall with intact apical motion consistent with McConell's sign.  2. Right ventricular systolic function is low normal. The right ventricular size is moderately enlarged. Tricuspid regurgitation signal is inadequate for assessing PA pressure.  3. Left ventricular ejection fraction, by estimation, is 55 to 60%. The left ventricle has normal function. The left ventricle has no regional wall motion abnormalities. Left ventricular diastolic parameters are consistent with Grade I diastolic dysfunction (impaired relaxation). The average left ventricular global longitudinal strain is -11.5 %. The global longitudinal strain is abnormal.  4. The mitral valve is normal in structure. No evidence of mitral valve regurgitation. No evidence of mitral stenosis.  5. The aortic valve is normal in structure. Aortic valve regurgitation is not visualized. Aortic valve sclerosis is present, with no evidence of  aortic valve stenosis.  6. The inferior vena cava is normal in size with greater than 50% respiratory variability, suggesting right atrial pressure of 3 mmHg. FINDINGS  Left Ventricle: Left ventricular ejection fraction, by estimation, is 55 to 60%. The left ventricle has normal function. The left ventricle has no regional wall motion abnormalities. Definity  contrast agent was given IV to delineate the left ventricular  endocardial borders. The average left ventricular global longitudinal strain is -11.5 %. Strain was performed and the global longitudinal strain is abnormal. The left ventricular internal cavity size was normal in size. There is no left ventricular hypertrophy. Left ventricular diastolic parameters are consistent with Grade I diastolic dysfunction (impaired relaxation). Right Ventricle: Akinesis of the RV mid free wall with intact apical motion. The right ventricular size is moderately enlarged. No increase in right ventricular wall thickness. Right ventricular systolic function is low normal. Tricuspid regurgitation signal is inadequate for assessing PA pressure. Left Atrium: Left atrial size was normal in size. Right Atrium: Right atrial size was normal in size. Pericardium: There is no evidence of pericardial effusion. Mitral Valve: The mitral valve is normal in structure. No evidence of mitral valve regurgitation. No evidence of mitral valve stenosis. Tricuspid Valve: The tricuspid valve is normal in structure. Tricuspid valve regurgitation is not demonstrated. No evidence of tricuspid stenosis. Aortic Valve: The aortic valve is normal in structure. Aortic valve regurgitation is not visualized. Aortic valve sclerosis is present, with no evidence of aortic valve stenosis. Pulmonic Valve: The pulmonic valve was not well visualized. Pulmonic valve regurgitation is not visualized. No evidence of pulmonic stenosis. Aorta: The aortic root is normal in size and structure. Venous: The inferior vena cava  is normal in size with greater than 50% respiratory variability, suggesting right atrial pressure of 3 mmHg. IAS/Shunts: No atrial level shunt detected by color flow Doppler.  LEFT VENTRICLE PLAX 2D LVIDd:         3.60 cm     Diastology LVIDs:         2.70 cm     LV e' medial:    6.74 cm/s LV PW:         1.10 cm     LV E/e' medial:  11.6 LV  IVS:        1.00 cm     LV e' lateral:   10.00 cm/s LVOT diam:     2.30 cm     LV E/e' lateral: 7.8 LV SV:         72 LV SV Index:   34          2D Longitudinal Strain LVOT Area:     4.15 cm    2D Strain GLS (A4C):   -11.5 % LV IVRT:       99 msec     2D Strain GLS (A3C):   -10.0 %                            2D Strain GLS (A2C):   -12.9 %                            2D Strain GLS Avg:     -11.5 % LV Volumes (MOD) LV vol d, MOD A4C: 99.1 ml LV vol s, MOD A4C: 35.3 ml LV SV MOD A4C:     99.1 ml RIGHT VENTRICLE             IVC RV Basal diam:  2.20 cm     IVC diam: 1.60 cm RV S prime:     10.10 cm/s TAPSE (M-mode): 2.2 cm LEFT ATRIUM             Index        RIGHT ATRIUM           Index LA diam:        3.20 cm 1.49 cm/m   RA Area:     10.90 cm LA Vol (A2C):   38.0 ml 17.71 ml/m  RA Volume:   22.30 ml  10.39 ml/m LA Vol (A4C):   27.5 ml 12.81 ml/m LA Biplane Vol: 32.5 ml 15.14 ml/m  AORTIC VALVE LVOT Vmax:   94.00 cm/s LVOT Vmean:  62.200 cm/s LVOT VTI:    0.174 m  AORTA Ao Root diam: 3.20 cm MITRAL VALVE MV Area (PHT): 4.39 cm    SHUNTS MV Decel Time: 173 msec    Systemic VTI:  0.17 m MV E velocity: 78.50 cm/s  Systemic Diam: 2.30 cm MV A velocity: 91.30 cm/s MV E/A ratio:  0.86 Emeline Calender Electronically signed by Emeline Calender Signature Date/Time: 10/01/2024/2:15:58 PM    Final    CT Angio Chest/Abd/Pel for Dissection W and/or Wo Contrast Result Date: 10/01/2024 EXAM: CTA CHEST, ABDOMEN AND PELVIS WITHOUT AND WITH CONTRAST 10/01/2024 12:51:49 AM TECHNIQUE: CTA of the chest was performed without and with the administration of intravenous contrast. CTA of the abdomen and pelvis  was performed without and with the administration of intravenous contrast. 100 mL of iohexol  (OMNIPAQUE ) 350 MG/ML injection was administered. Multiplanar reformatted images are provided for review. MIP images are provided for review. Automated exposure control, iterative reconstruction, and/or weight based adjustment of the mA/kV was utilized to reduce the radiation dose to as low as reasonably achievable. COMPARISON: 11/10/2023 CLINICAL HISTORY: Acute aortic syndrome (AAS) suspected. FINDINGS: VASCULATURE: AORTA: Scattered aortic atherosclerosis. Distal aortobifemoral bypass. No acute finding. No abdominal aortic aneurysm. No dissection. PULMONARY ARTERIES: No pulmonary embolism with the limits of this exam. GREAT VESSELS OF AORTIC ARCH: No acute finding. No dissection. No arterial occlusion or significant stenosis. CELIAC TRUNK: No acute finding. No occlusion or significant stenosis.  SUPERIOR MESENTERIC ARTERY: No acute finding. No occlusion or significant stenosis. INFERIOR MESENTERIC ARTERY: No acute finding. No occlusion or significant stenosis. RENAL ARTERIES: No acute finding. No occlusion or significant stenosis. ILIAC ARTERIES: No acute finding. No occlusion or significant stenosis. CHEST: MEDIASTINUM: Scattered coronary artery atherosclerosis. No mediastinal lymphadenopathy. The heart and pericardium demonstrate no acute abnormality. LUNGS AND PLEURA: The lungs are without acute process. No focal consolidation or pulmonary edema. No evidence of pleural effusion or pneumothorax. THORACIC BONES AND SOFT TISSUES: Right thyroid  nodule measures up to 2.6 cm. This has been previously evaluated by ultrasound and biopsy. No acute bone or soft tissue abnormality. ABDOMEN AND PELVIS: LIVER: The liver is unremarkable. GALLBLADDER AND BILE DUCTS: The gallbladder is contracted. 1.5 cm gallstone noted. No biliary ductal dilatation. SPLEEN: The spleen is unremarkable. PANCREAS: The pancreas is unremarkable. ADRENAL  GLANDS: Bilateral adrenal glands demonstrate no acute abnormality. KIDNEYS, URETERS AND BLADDER: No stones in the kidneys or ureters. No hydronephrosis. No perinephric or periureteral stranding. Urinary bladder is unremarkable. GI AND BOWEL: Stomach and duodenal sweep demonstrate no acute abnormality. Normal appendix. There is no bowel obstruction. No abnormal bowel wall thickening or distension. REPRODUCTIVE: Reproductive organs are unremarkable. PERITONEUM AND RETROPERITONEUM: No ascites or free air. LYMPH NODES: No lymphadenopathy. ABDOMINAL BONES AND SOFT TISSUES: No acute abnormality of the bones. No acute soft tissue abnormality. IMPRESSION: 1. No acute aortic syndrome. 2. Aortobifemoral bypass. 3. Scattered coronary artery and aortic atherosclerosis. 4. No acute findings in the chest, abdomen, or pelvis. Electronically signed by: Franky Crease MD MD 10/01/2024 12:58 AM EST RP Workstation: [id number]   DG Chest 2 View Result Date: 09/30/2024 CLINICAL DATA:  Chest pain EXAM: CHEST - 2 VIEW COMPARISON:  08/07/2016 FINDINGS: The heart size and mediastinal contours are within normal limits. Both lungs are clear. The visualized skeletal structures are unremarkable. IMPRESSION: No active cardiopulmonary disease. Electronically Signed   By: Luke Bun M.D.   On: 09/30/2024 23:22      Subjective: Patient seen and examined at bedside today.  Hemodynamically stable.  Chest pain-free.  Very eager to go home today.  Medically stable for discharge.  Discharge Exam: Vitals:   10/02/24 0441 10/02/24 0718  BP: 106/68 (!) 142/94  Pulse: 81 88  Resp: 18 18  Temp: 98.4 F (36.9 C) 97.9 F (36.6 C)  SpO2: 92% 92%   Vitals:   10/02/24 0024 10/02/24 0305 10/02/24 0441 10/02/24 0718  BP:   106/68 (!) 142/94  Pulse:   81 88  Resp:   18 18  Temp:   98.4 F (36.9 C) 97.9 F (36.6 C)  TempSrc:   Oral Oral  SpO2:   92% 92%  Weight: 94.3 kg 94.3 kg    Height:        General: Pt is alert, awake, not in  acute distress, obese Cardiovascular: RRR, S1/S2 +, no rubs, no gallops Respiratory: CTA bilaterally, no wheezing, no rhonchi Abdominal: Soft, NT, ND, bowel sounds + Extremities: no edema, no cyanosis    The results of significant diagnostics from this hospitalization (including imaging, microbiology, ancillary and laboratory) are listed below for reference.     Microbiology: No results found for this or any previous visit (from the past 240 hours).   Labs: BNP (last 3 results) No results for input(s): BNP in the last 8760 hours. Basic Metabolic Panel: Recent Labs  Lab 09/30/24 2256 10/01/24 0422  NA 137 137  K 4.4 4.5  CL 104 101  CO2 24 24  GLUCOSE 126* 107*  BUN 15 14  CREATININE 0.90 0.87  CALCIUM  8.4* 9.1  MG  --  2.2   Liver Function Tests: Recent Labs  Lab 10/01/24 0422  AST 21  ALT 13  ALKPHOS 107  BILITOT 0.2  PROT 7.1  ALBUMIN  3.9   Recent Labs  Lab 10/01/24 0438  LIPASE 14   No results for input(s): AMMONIA in the last 168 hours. CBC: Recent Labs  Lab 09/30/24 2256 10/01/24 0422 10/02/24 0408  WBC 12.7* 13.2* 10.0  NEUTROABS  --  7.3  --   HGB 14.4 15.0 14.1  HCT 44.7 45.7 43.1  MCV 96.1 95.0 93.9  PLT 253 259 242   Cardiac Enzymes: No results for input(s): CKTOTAL, CKMB, CKMBINDEX, TROPONINI in the last 168 hours. BNP: Invalid input(s): POCBNP CBG: No results for input(s): GLUCAP in the last 168 hours. D-Dimer No results for input(s): DDIMER in the last 72 hours. Hgb A1c No results for input(s): HGBA1C in the last 72 hours. Lipid Profile No results for input(s): CHOL, HDL, LDLCALC, TRIG, CHOLHDL, LDLDIRECT in the last 72 hours. Thyroid  function studies No results for input(s): TSH, T4TOTAL, T3FREE, THYROIDAB in the last 72 hours.  Invalid input(s): FREET3 Anemia work up No results for input(s): VITAMINB12, FOLATE, FERRITIN, TIBC, IRON, RETICCTPCT in the last 72  hours. Urinalysis    Component Value Date/Time   COLORURINE YELLOW 11/10/2023 1215   APPEARANCEUR CLEAR 11/10/2023 1215   LABSPEC 1.025 11/10/2023 1215   PHURINE 5.5 11/10/2023 1215   GLUCOSEU NEGATIVE 11/10/2023 1215   HGBUR NEGATIVE 11/10/2023 1215   BILIRUBINUR NEGATIVE 11/10/2023 1215   KETONESUR NEGATIVE 11/10/2023 1215   PROTEINUR NEGATIVE 11/10/2023 1215   UROBILINOGEN 0.2 08/08/2013 1430   NITRITE NEGATIVE 11/10/2023 1215   LEUKOCYTESUR NEGATIVE 11/10/2023 1215   Sepsis Labs Recent Labs  Lab 09/30/24 2256 10/01/24 0422 10/02/24 0408  WBC 12.7* 13.2* 10.0   Microbiology No results found for this or any previous visit (from the past 240 hours).  Please note: You were cared for by a hospitalist during your hospital stay. Once you are discharged, your primary care physician will handle any further medical issues. Please note that NO REFILLS for any discharge medications will be authorized once you are discharged, as it is imperative that you return to your primary care physician (or establish a relationship with a primary care physician if you do not have one) for your post hospital discharge needs so that they can reassess your need for medications and monitor your lab values.    Time coordinating discharge: 40 minutes  SIGNED:   Ivonne Mustache, MD  [facility] 10/02/2024, 10:00 AM Pager [phone]  If 7PM-7AM, please contact night-coverage [email or web address] Password [email or web address]    [1]  Allergies Allergen Reactions   Bee Venom Anaphylaxis   Penicillins Anaphylaxis    Has patient had a PCN reaction causing immediate rash, facial/tongue/throat swelling, SOB or lightheadedness with hypotension: Yes Has patient had a PCN reaction causing severe rash involving mucus membranes or skin necrosis: No Has patient had a PCN reaction that required hospitalization Yes Has patient had a PCN reaction occurring within the last 10 years: No If all of the above answers are  NO, then may proceed with Cephalosporin use.    Yellow Jacket Venom Anaphylaxis   Celecoxib Nausea And Vomiting and Rash   Cephalexin  Nausea And Vomiting, Other (See Comments), Rash and Hives    Projectile vomiting, thrush, fever blisters, ED visit   Lidocaine  Swelling and  Rash    Foot swelled up like a balloon   Codeine Other (See Comments)    Reflux. Pt states not allergic to hydrocodone  and oxycodone  .

## 2024-10-02 NOTE — Plan of Care (Signed)
" °  Problem: Education: Goal: Knowledge of General Education information will improve Description: Including pain rating scale, medication(s)/side effects and non-pharmacologic comfort measures Outcome: Progressing   Problem: Clinical Measurements: Goal: Ability to maintain clinical measurements within normal limits will improve Outcome: Progressing   Problem: Clinical Measurements: Goal: Will remain free from infection Outcome: Progressing   Problem: Clinical Measurements: Goal: Diagnostic test results will improve Outcome: Progressing   Problem: Clinical Measurements: Goal: Respiratory complications will improve Outcome: Progressing   Problem: Clinical Measurements: Goal: Cardiovascular complication will be avoided Outcome: Progressing   Problem: Activity: Goal: Risk for activity intolerance will decrease Outcome: Progressing   Problem: Nutrition: Goal: Adequate nutrition will be maintained Outcome: Progressing   Problem: Pain Managment: Goal: General experience of comfort will improve and/or be controlled Outcome: Progressing   Problem: Safety: Goal: Ability to remain free from injury will improve Outcome: Progressing   Problem: Skin Integrity: Goal: Risk for impaired skin integrity will decrease Outcome: Progressing   "

## 2024-10-03 ENCOUNTER — Encounter (HOSPITAL_COMMUNITY): Payer: Self-pay | Admitting: Cardiology

## 2024-10-04 LAB — LIPOPROTEIN A (LPA): Lipoprotein (a): 112.9 nmol/L — ABNORMAL HIGH

## 2024-10-13 ENCOUNTER — Ambulatory Visit: Attending: Nurse Practitioner | Admitting: Nurse Practitioner

## 2024-10-13 ENCOUNTER — Encounter: Payer: Self-pay | Admitting: Nurse Practitioner

## 2024-10-13 VITALS — BP 108/66 | HR 73 | Ht 67.0 in | Wt 210.0 lb

## 2024-10-13 DIAGNOSIS — E785 Hyperlipidemia, unspecified: Secondary | ICD-10-CM | POA: Diagnosis not present

## 2024-10-13 DIAGNOSIS — I739 Peripheral vascular disease, unspecified: Secondary | ICD-10-CM | POA: Diagnosis not present

## 2024-10-13 DIAGNOSIS — I10 Essential (primary) hypertension: Secondary | ICD-10-CM | POA: Diagnosis not present

## 2024-10-13 DIAGNOSIS — I2510 Atherosclerotic heart disease of native coronary artery without angina pectoris: Secondary | ICD-10-CM | POA: Insufficient documentation

## 2024-10-13 DIAGNOSIS — Z720 Tobacco use: Secondary | ICD-10-CM | POA: Diagnosis not present

## 2024-10-13 NOTE — Progress Notes (Signed)
 "  Office Visit    Patient Name: Frances Graham Date of Encounter: 10/13/2024  Primary Care Provider:  Loring Tanda Mae, MD Primary Cardiologist:  Lonni Cash, MD  Chief Complaint    59 year old female with a history of nonobstructive CAD, PAD s/p aortobifemoral bypass, hypertension, hyperlipidemia, ADD, anxiety, and tobacco use who presents for hospital follow-up related to CAD and chest pain.   Past Medical History    Past Medical History:  Diagnosis Date   Anxiety    panic attacks- has then post op   Asthma    as a child   Attention deficit disorder    Essential hypertension    off  medications, has lost 69 lbs   Headache    History of kidney stones    passed   PAD (peripheral artery disease)    Infrarenal aortic occlusion   Temporomandibular joint disorder    Past Surgical History:  Procedure Laterality Date   AMPUTATION Right 08/01/2016   Procedure: AMPUTATION GREAT TOE;  Surgeon: Lonni GORMAN Blade, MD;  Location: [facility] OR;  Service: Vascular;  Laterality: Right;   AMPUTATION TOE Right 08/01/2016   AORTA - BILATERAL FEMORAL ARTERY BYPASS GRAFT Bilateral 06/28/2016   Procedure: AORTOBIFEMORAL BYPASS GRAFT;  Surgeon: Lonni GORMAN Blade, MD;  Location: [facility] OR;  Service: Vascular;  Laterality: Bilateral;   BIOPSY THYROID      INCISIONAL HERNIA REPAIR N/A 01/20/2020   Procedure: LAPAROSCOPIC INCISIONAL HERNIA REPAIR WITH MESH;  Surgeon: Vernetta Berg, MD;  Location: [facility] OR;  Service: General;  Laterality: N/A;   LEFT HEART CATH AND CORONARY ANGIOGRAPHY N/A 10/01/2024   Procedure: LEFT HEART CATH AND CORONARY ANGIOGRAPHY;  Surgeon: Ladona Heinz, MD;  Location: [facility] INVASIVE CV LAB;  Service: Cardiovascular;  Laterality: N/A;   LEG SURGERY Left 1970   pin  to fix cogental hip   MENISCUS REPAIR Left    2015   TOOTH EXTRACTION Bilateral 10/23/2018   Procedure: DENTAL RESTORATION/EXTRACTIONS;  Surgeon: Sheryle Hamilton, DDS;  Location: [facility] OR;  Service: Oral Surgery;   Laterality: Bilateral;    Allergies  Allergies[1]   Labs/Other Studies Reviewed    The following studies were reviewed today:  Cardiac Studies & Procedures   ______________________________________________________________________________________________ CARDIAC CATHETERIZATION  CARDIAC CATHETERIZATION 10/01/2024  Conclusion Images from the original result were not included. Cardiac Catheterization 10/01/2024: Hemodynamic data: LV 117/19, EDP 12 mmHg.  Ao 98/71, mean 86 mmHg.  There is no pressure gradient across the aortic valve.  Angiographic data: LM: Large-caliber vessel, smooth and normal. LCx: Gives origin to high large OM1, small OM 2, moderate-sized OM 3 and a tiny OM 4.  OM 4 has a mid 30% stenosis. LAD: Large-caliber vessel, has mild 20% tandem stenosis in the proximal segment.  Small to moderate-sized D1.  Mid segment of the LAD has a 40% stenosis. RCA: Large-caliber vessel, proximal segment has a 50% stenosis with heavy plaque burden.  There is a tandem mid 50% stenosis again appears to have significant amount of plaque burden.  The PDA has about a 40% focal stenosis.    Impression and recommendations: Patient has moderate coronary artery disease and has heavy plaque burden especially in the right coronary artery, she would benefit from being on DAPT with aspirin  and Plavix  for at least 6 months and probably in view of PAD Plavix  indefinitely.  Smoking cessation has been discussed with the patient.  Findings Coronary Findings Diagnostic  Dominance: Right  Left Anterior Descending Prox LAD lesion is 20% stenosed. Mid LAD-1  lesion is 30% stenosed. Mid LAD-2 lesion is 40% stenosed.  Left Circumflex Dist Cx lesion is 30% stenosed.  Right Coronary Artery Prox RCA lesion is 50% stenosed. Mid RCA lesion is 50% stenosed.  Right Posterior Descending Artery RPDA-1 lesion is 30% stenosed. RPDA-2 lesion is 40% stenosed.  Intervention  No interventions have been  documented.   STRESS TESTS  NM MYOCAR MULTI W/SPECT W 06/24/2016  Narrative CLINICAL DATA:  Preoperative clearance needed.  Aortic occlusion.  EXAM: MYOCARDIAL IMAGING WITH SPECT (REST AND PHARMACOLOGIC-STRESS)  GATED LEFT VENTRICULAR WALL MOTION STUDY  LEFT VENTRICULAR EJECTION FRACTION  TECHNIQUE: Standard myocardial SPECT imaging was performed after resting intravenous injection of 10 mCi Tc-7m tetrofosmin . Subsequently, intravenous infusion of Lexiscan  was performed under the supervision of the Cardiology staff. At peak effect of the drug, 30 mCi Tc-79m tetrofosmin  was injected intravenously and standard myocardial SPECT imaging was performed. Quantitative gated imaging was also performed to evaluate left ventricular wall motion, and estimate left ventricular ejection fraction.  COMPARISON:  None.  FINDINGS: Perfusion: There is gut uptake on both the rest and stress images. The gut uptake causes limited evaluation of the septal and inferoseptal wall. No evidence to suggest reversibility. No clear evidence for an infarct.  Wall Motion: Abnormal wall motion involving the septal and inferoseptal wall. Concern for paradoxical motion along the anteroseptal wall and akinesia in the inferoseptal wall. Normal wall motion in the anterior and lateral walls.  Left Ventricular Ejection Fraction: 56 %  End diastolic volume 81 ml  End systolic volume 36 ml  IMPRESSION: 1. No reversible ischemia. However, the study has technical limitations due to a large amount of gut uptake and limited evaluation of the septal and inferoseptal walls as described.  2. Abnormal wall motion involving the septal and inferoseptal wall as described. This abnormal wall motion may be associated with the gut uptake. Wall motion in these areas are equivocal.  3. Left ventricular ejection fraction is 56%.  4. Non invasive risk stratification*: Low  *2012 Appropriate Use Criteria for Coronary  Revascularization Focused Update: J Am Coll Cardiol. 2012;59(9):857-881. http://content.dementiazones.com.aspx?articleid=1201161   Electronically Signed By: Juliene Balder M.D. On: 06/24/2016 13:52   ECHOCARDIOGRAM  ECHOCARDIOGRAM COMPLETE 10/01/2024  Narrative ECHOCARDIOGRAM REPORT    Patient Name:   Frances Graham [facility] Date of Exam: 10/01/2024 Medical Rec #:  [id number]         Height:       67.0 in Accession #:    [id number]        Weight:       230.0 lb Date of Birth:  03/15/1966        BSA:          2.146 m Patient Age:    58 years          BP:           155/92 mmHg Patient Gender: F                 HR:           80 bpm. Exam Location:  Inpatient  Procedure: 2D Echo (Both Spectral and Color Flow Doppler were utilized during procedure).  Indications:    chest pain  History:        Patient has prior history of Echocardiogram examinations, most recent 06/25/2016. PAD, Signs/Symptoms:Chest Pain; Risk Factors:Hypertension and Current Smoker.  Sonographer:    Tinnie Barefoot RDCS Referring Phys: [id number] EVA KATHEE PORE   Sonographer Comments: Global longitudinal strain was  attempted. IMPRESSIONS   1. Akinesis of the RV mid free wall with intact apical motion consistent with McConell's sign. 2. Right ventricular systolic function is low normal. The right ventricular size is moderately enlarged. Tricuspid regurgitation signal is inadequate for assessing PA pressure. 3. Left ventricular ejection fraction, by estimation, is 55 to 60%. The left ventricle has normal function. The left ventricle has no regional wall motion abnormalities. Left ventricular diastolic parameters are consistent with Grade I diastolic dysfunction (impaired relaxation). The average left ventricular global longitudinal strain is -11.5 %. The global longitudinal strain is abnormal. 4. The mitral valve is normal in structure. No evidence of mitral valve regurgitation. No evidence of mitral stenosis. 5.  The aortic valve is normal in structure. Aortic valve regurgitation is not visualized. Aortic valve sclerosis is present, with no evidence of aortic valve stenosis. 6. The inferior vena cava is normal in size with greater than 50% respiratory variability, suggesting right atrial pressure of 3 mmHg.  FINDINGS Left Ventricle: Left ventricular ejection fraction, by estimation, is 55 to 60%. The left ventricle has normal function. The left ventricle has no regional wall motion abnormalities. Definity  contrast agent was given IV to delineate the left ventricular endocardial borders. The average left ventricular global longitudinal strain is -11.5 %. Strain was performed and the global longitudinal strain is abnormal. The left ventricular internal cavity size was normal in size. There is no left ventricular hypertrophy. Left ventricular diastolic parameters are consistent with Grade I diastolic dysfunction (impaired relaxation).  Right Ventricle: Akinesis of the RV mid free wall with intact apical motion. The right ventricular size is moderately enlarged. No increase in right ventricular wall thickness. Right ventricular systolic function is low normal. Tricuspid regurgitation signal is inadequate for assessing PA pressure.  Left Atrium: Left atrial size was normal in size.  Right Atrium: Right atrial size was normal in size.  Pericardium: There is no evidence of pericardial effusion.  Mitral Valve: The mitral valve is normal in structure. No evidence of mitral valve regurgitation. No evidence of mitral valve stenosis.  Tricuspid Valve: The tricuspid valve is normal in structure. Tricuspid valve regurgitation is not demonstrated. No evidence of tricuspid stenosis.  Aortic Valve: The aortic valve is normal in structure. Aortic valve regurgitation is not visualized. Aortic valve sclerosis is present, with no evidence of aortic valve stenosis.  Pulmonic Valve: The pulmonic valve was not well  visualized. Pulmonic valve regurgitation is not visualized. No evidence of pulmonic stenosis.  Aorta: The aortic root is normal in size and structure.  Venous: The inferior vena cava is normal in size with greater than 50% respiratory variability, suggesting right atrial pressure of 3 mmHg.  IAS/Shunts: No atrial level shunt detected by color flow Doppler.   LEFT VENTRICLE PLAX 2D LVIDd:         3.60 cm     Diastology LVIDs:         2.70 cm     LV e' medial:    6.74 cm/s LV PW:         1.10 cm     LV E/e' medial:  11.6 LV IVS:        1.00 cm     LV e' lateral:   10.00 cm/s LVOT diam:     2.30 cm     LV E/e' lateral: 7.8 LV SV:         72 LV SV Index:   34          2D  Longitudinal Strain LVOT Area:     4.15 cm    2D Strain GLS (A4C):   -11.5 % LV IVRT:       99 msec     2D Strain GLS (A3C):   -10.0 % 2D Strain GLS (A2C):   -12.9 % 2D Strain GLS Avg:     -11.5 % LV Volumes (MOD) LV vol d, MOD A4C: 99.1 ml LV vol s, MOD A4C: 35.3 ml LV SV MOD A4C:     99.1 ml  RIGHT VENTRICLE             IVC RV Basal diam:  2.20 cm     IVC diam: 1.60 cm RV S prime:     10.10 cm/s TAPSE (M-mode): 2.2 cm  LEFT ATRIUM             Index        RIGHT ATRIUM           Index LA diam:        3.20 cm 1.49 cm/m   RA Area:     10.90 cm LA Vol (A2C):   38.0 ml 17.71 ml/m  RA Volume:   22.30 ml  10.39 ml/m LA Vol (A4C):   27.5 ml 12.81 ml/m LA Biplane Vol: 32.5 ml 15.14 ml/m AORTIC VALVE LVOT Vmax:   94.00 cm/s LVOT Vmean:  62.200 cm/s LVOT VTI:    0.174 m  AORTA Ao Root diam: 3.20 cm  MITRAL VALVE MV Area (PHT): 4.39 cm    SHUNTS MV Decel Time: 173 msec    Systemic VTI:  0.17 m MV E velocity: 78.50 cm/s  Systemic Diam: 2.30 cm MV A velocity: 91.30 cm/s MV E/A ratio:  0.86  Emeline Calender Electronically signed by Emeline Calender Signature Date/Time: 10/01/2024/2:15:58 PM    Final          ______________________________________________________________________________________________      Recent Labs: 10/01/2024: ALT 13; BUN 14; Creatinine, Ser 0.87; Magnesium  2.2; Potassium 4.5; Sodium 137 10/02/2024: Hemoglobin 14.1; Platelets 242  Recent Lipid Panel    Component Value Date/Time   CHOL 227 (H) 06/24/2016 0348   TRIG 370 (H) 06/24/2016 0348   HDL 27 (L) 06/24/2016 0348   CHOLHDL 8.4 06/24/2016 0348   VLDL 74 (H) 06/24/2016 0348   LDLCALC 126 (H) 06/24/2016 0348    History of Present Illness    59 year old female with with the above past medical history including CAD, PAD s/p aortobifemoral bypass, hypertension, hyperlipidemia, ADD, anxiety, and tobacco use.  She has a history of low risk Myoview  in 2017. She has a history of PAD s/p prior aortobifem bypass in 2017.  She presented to the ED on 10/01/2024 in the setting of chest pain.  Troponin was mildly elevated.  Cardiac catheterization revealed moderate nonobstructive coronary artery disease, heavy plaque burden in the RCA.  Medical therapy was recommended.  She was started on aspirin  and Plavix .  Echocardiogram showed EF 55 to 60%, G1 DD, low normal RV function, no significant valvular abnormalities.  CT angio of the chest was negative for PE.  She was started on Crestor , metoprolol .   She presents today for follow-up.  Since her hospitalization she has been stable from a cardiac standpoint.  She denies symptoms concerning for angina.  She saw her PCP and was noted to have hypotension, lisinopril  was decreased to 10 mg daily.  BP has been stable. Her biggest complaint is that since her hospital discharge she has had vivid nightmares.  She will wake up abruptly and feel her heart racing.  This has happened 2-3 times since her hospital discharge.  She denies any other palpitations, dizziness, dyspnea, edema, PND, orthopnea, weight gain.    Home Medications    Current Outpatient Medications  Medication Sig Dispense Refill   amphetamine -dextroamphetamine  (ADDERALL) 20 MG tablet Take 20 mg by mouth daily as needed  (attention/focus/ADD).     aspirin  EC 81 MG tablet Take 1 tablet (81 mg total) by mouth daily. Swallow whole. 60 tablet 0   clopidogrel  (PLAVIX ) 75 MG tablet Take 1 tablet (75 mg total) by mouth daily with breakfast. 60 tablet 0   EPINEPHrine  0.3 mg/0.3 mL IJ SOAJ injection Inject 0.3 mg into the muscle as directed.     lisinopril  (ZESTRIL ) 20 MG tablet Take 1 tablet (20 mg total) by mouth daily. (Patient taking differently: Take 10 mg by mouth daily.) 60 tablet 0   metoprolol  succinate (TOPROL -XL) 25 MG 24 hr tablet Take 1 tablet (25 mg total) by mouth daily. (Patient taking differently: Take 12.5 mg by mouth daily.) 60 tablet 0   nicotine  (NICODERM CQ  - DOSED IN MG/24 HOURS) 14 mg/24hr patch Place 1 patch (14 mg total) onto the skin daily as needed (smoking cessation). 28 patch 0   ondansetron  (ZOFRAN -ODT) 8 MG disintegrating tablet Take 8 mg by mouth every 8 (eight) hours as needed for nausea, vomiting or refractory nausea / vomiting.     Oxycodone  HCl 10 MG TABS Take 10 mg by mouth every 6 (six) hours.     rosuvastatin  (CRESTOR ) 20 MG tablet Take 1 tablet (20 mg total) by mouth daily. 60 tablet 0   SUMAtriptan (IMITREX) 100 MG tablet TAKE 1 TABLET BY MOUTH AS NEEDED FOR MIGRAINE AND REPEAT IN 2 HOURS. ONLY UP TO A MAX OF 2 PER DAY     traZODone (DESYREL) 150 MG tablet Take 150 mg by mouth at bedtime as needed for sleep.     No current facility-administered medications for this visit.     Review of Systems    She denies chest pain, dyspnea, pnd, orthopnea, n, v, dizziness, syncope, edema, weight gain, or early satiety. All other systems reviewed and are otherwise negative except as noted above.   Physical Exam    VS:  BP 108/66 (Cuff Size: Normal)   Pulse 73   Ht 5' 7 (1.702 m)   Wt 210 lb (95.3 kg)   SpO2 97%   BMI 32.89 kg/m   GEN: Well nourished, well developed, in no acute distress. HEENT: normal. Neck: Supple, no JVD, carotid bruits, or masses. Cardiac: RRR, no murmurs, rubs,  or gallops. No clubbing, cyanosis, edema.  Radials/DP/PT 2+ and equal bilaterally.  Mild bruising and healing stages to right radial cath site, no bleeding or hematoma. Respiratory:  Respirations regular and unlabored, clear to auscultation bilaterally. GI: Soft, nontender, nondistended, BS + x 4. MS: no deformity or atrophy. Skin: warm and dry, no rash. Neuro:  Strength and sensation are intact. Psych: Normal affect.  Accessory Clinical Findings    ECG personally reviewed by me today - EKG Interpretation Date/Time:  Wednesday October 13 2024 13:58:31 EST Ventricular Rate:  73 PR Interval:  174 QRS Duration:  82 QT Interval:  382 QTC Calculation: 420 R Axis:   40  Text Interpretation: Normal sinus rhythm Nonspecific ST and T wave abnormality When compared with ECG of 01-Oct-2024 04:22, No significant change was found Confirmed by Daneen Perkins ([id number]) on 10/13/2024 2:09:12 PM  -  no acute changes.   Lab Results  Component Value Date   WBC 10.0 10/02/2024   HGB 14.1 10/02/2024   HCT 43.1 10/02/2024   MCV 93.9 10/02/2024   PLT 242 10/02/2024   Lab Results  Component Value Date   CREATININE 0.87 10/01/2024   BUN 14 10/01/2024   NA 137 10/01/2024   K 4.5 10/01/2024   CL 101 10/01/2024   CO2 24 10/01/2024   Lab Results  Component Value Date   ALT 13 10/01/2024   AST 21 10/01/2024   ALKPHOS 107 10/01/2024   BILITOT 0.2 10/01/2024   Lab Results  Component Value Date   CHOL 227 (H) 06/24/2016   HDL 27 (L) 06/24/2016   LDLCALC 126 (H) 06/24/2016   TRIG 370 (H) 06/24/2016   CHOLHDL 8.4 06/24/2016    Lab Results  Component Value Date   HGBA1C 5.8 (H) 06/23/2016    Assessment & Plan    1. CAD: She has a history of low risk Myoview  in 2017. Cardiac catheterization revealed moderate nonobstructive coronary artery disease, heavy plaque burden in the RCA.  Medical therapy was recommended.  Echocardiogram showed EF 55 to 60%, G1 DD, low normal RV function, no significant  valvular abnormalities.  She was started on aspirin  and Plavix . Stable with no anginal symptoms. No indication for ischemic evaluation.  She does report new nightmares since hospital discharge.  She describes her nightmares as vivid.  She wakes up abruptly and feels her heart racing. This has happened 2-3 times since hospital discharge.  Reviewed with Pharm.D., it is possible that metoprolol  is contributing to her symptoms.  Will decrease metoprolol  to 12.5 mg daily.  Continue to monitor symptoms.  Alternatively, could consider transitioning from metoprolol  to carvedilol or alternative beta-blocker.  We discussed possible cardiac monitor, through shared decision making, will defer for now.  Should she continue have symptoms, can revisit at follow-up.  Continue aspirin , Plavix , lisinopril , metoprolol  as above, Crestor .  2. PAD: S/p prior aortobifem bypass in 2017.  Denies claudication.  Continue aspirin , Plavix , Crestor .  3. Hypertension: BP well controlled. Continue current antihypertensive regimen.   4. Hyperlipidemia: No recent LDL on file. LP(a) was 112.9 on 10/02/2024.  She was recently started on Crestor .  Will update fasting lipids, CMET in 6 to 8 weeks. Continue Crestor .  5. Tobacco use: She continues to smoke, but plans to quit.  Full cessation advised.   6. Disposition: Follow-up in 3 to 4 months.      Damien JAYSON Braver, NP 10/13/2024, 2:48 PM       [1]  Allergies Allergen Reactions   Bee Venom Anaphylaxis   Penicillins Anaphylaxis    Has patient had a PCN reaction causing immediate rash, facial/tongue/throat swelling, SOB or lightheadedness with hypotension: Yes Has patient had a PCN reaction causing severe rash involving mucus membranes or skin necrosis: No Has patient had a PCN reaction that required hospitalization Yes Has patient had a PCN reaction occurring within the last 10 years: No If all of the above answers are NO, then may proceed with Cephalosporin use.    Yellow  Jacket Venom Anaphylaxis   Celecoxib Nausea And Vomiting and Rash   Cephalexin  Nausea And Vomiting, Other (See Comments), Rash and Hives    Projectile vomiting, thrush, fever blisters, ED visit   Lidocaine  Swelling and Rash    Foot swelled up like a balloon   Codeine Other (See Comments)    Reflux. Pt states not allergic to hydrocodone  and oxycodone  .   "

## 2024-10-13 NOTE — Patient Instructions (Signed)
 Medication Instructions:  Decrease Metoprolol  Tartrate 12.5 mg in the morning  *If you need a refill on your cardiac medications before your next appointment, please call your pharmacy*  Lab Work: Fasting lipid panel & CMET in 6-8 weeks  Testing/Procedures: NONE ordered at this time of appointment   Follow-Up: At [facility], you and your health needs are our priority.  As part of our continuing mission to provide you with exceptional heart care, our providers are all part of one team.  This team includes your primary Cardiologist (physician) and Advanced Practice Providers or APPs (Physician Assistants and Nurse Practitioners) who all work together to provide you with the care you need, when you need it.  Your next appointment:   3-4 months  Provider:   Lonni Cash, MD or Damien Braver, NP          We recommend signing up for the patient portal called MyChart.  Sign up information is provided on this After Visit Summary.  MyChart is used to connect with patients for Virtual Visits (Telemedicine).  Patients are able to view lab/test results, encounter notes, upcoming appointments, etc.  Non-urgent messages can be sent to your provider as well.   To learn more about what you can do with MyChart, go to [email or web address].   Other Instructions

## 2024-10-15 NOTE — Progress Notes (Signed)
"   Order(s) created erroneously. Erroneous order ID: [id number]  Order moved by: STONEY MAGALENE FALCON  Order move date/time: 10/15/2024 9:07 AM  Source Patient: [id number]  Source Contact: 10/13/2024  Destination Patient: [id number]  Destination Contact: 12/08/2012 "

## 2024-11-17 ENCOUNTER — Ambulatory Visit: Admitting: Diagnostic Neuroimaging

## 2025-01-13 ENCOUNTER — Ambulatory Visit: Admitting: Cardiovascular Disease
